# Patient Record
Sex: Male | Born: 1940 | Race: White | Hispanic: No | Marital: Married | Smoking: Never smoker
Health system: Southern US, Community
[De-identification: ages and names within clinical notes are randomized; demographics above are authoritative.]

## PROBLEM LIST (undated history)

## (undated) DIAGNOSIS — K317 Polyp of stomach and duodenum: Secondary | ICD-10-CM

## (undated) DIAGNOSIS — H269 Unspecified cataract: Secondary | ICD-10-CM

## (undated) DIAGNOSIS — M1990 Unspecified osteoarthritis, unspecified site: Secondary | ICD-10-CM

## (undated) DIAGNOSIS — K2270 Barrett's esophagus without dysplasia: Secondary | ICD-10-CM

## (undated) DIAGNOSIS — I10 Essential (primary) hypertension: Secondary | ICD-10-CM

## (undated) DIAGNOSIS — K219 Gastro-esophageal reflux disease without esophagitis: Secondary | ICD-10-CM

## (undated) DIAGNOSIS — Z973 Presence of spectacles and contact lenses: Secondary | ICD-10-CM

## (undated) DIAGNOSIS — K649 Unspecified hemorrhoids: Secondary | ICD-10-CM

## (undated) DIAGNOSIS — T7840XA Allergy, unspecified, initial encounter: Secondary | ICD-10-CM

## (undated) DIAGNOSIS — J189 Pneumonia, unspecified organism: Secondary | ICD-10-CM

## (undated) DIAGNOSIS — E785 Hyperlipidemia, unspecified: Secondary | ICD-10-CM

## (undated) DIAGNOSIS — H409 Unspecified glaucoma: Secondary | ICD-10-CM

## (undated) DIAGNOSIS — R55 Syncope and collapse: Secondary | ICD-10-CM

## (undated) DIAGNOSIS — I4510 Unspecified right bundle-branch block: Secondary | ICD-10-CM

## (undated) HISTORY — DX: Barrett's esophagus without dysplasia: K22.70

## (undated) HISTORY — DX: Unspecified right bundle-branch block: I45.10

## (undated) HISTORY — DX: Unspecified cataract: H26.9

## (undated) HISTORY — DX: Hyperlipidemia, unspecified: E78.5

## (undated) HISTORY — DX: Allergy, unspecified, initial encounter: T78.40XA

## (undated) HISTORY — DX: Gastro-esophageal reflux disease without esophagitis: K21.9

## (undated) HISTORY — DX: Essential (primary) hypertension: I10

## (undated) HISTORY — PX: POLYPECTOMY: SHX149

## (undated) HISTORY — DX: Unspecified osteoarthritis, unspecified site: M19.90

## (undated) HISTORY — PX: COLONOSCOPY: SHX174

## (undated) HISTORY — DX: Unspecified hemorrhoids: K64.9

## (undated) HISTORY — DX: Unspecified glaucoma: H40.9

## (undated) HISTORY — PX: CATARACT EXTRACTION W/ INTRAOCULAR LENS  IMPLANT, BILATERAL: SHX1307

## (undated) HISTORY — PX: WISDOM TOOTH EXTRACTION: SHX21

---
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
## 1991-09-16 HISTORY — PX: CARDIAC CATHETERIZATION: SHX172

## 2011-11-18 DIAGNOSIS — H524 Presbyopia: Secondary | ICD-10-CM | POA: Diagnosis not present

## 2011-11-18 DIAGNOSIS — H4011X0 Primary open-angle glaucoma, stage unspecified: Secondary | ICD-10-CM | POA: Diagnosis not present

## 2011-11-18 DIAGNOSIS — H409 Unspecified glaucoma: Secondary | ICD-10-CM | POA: Diagnosis not present

## 2012-01-20 DIAGNOSIS — E782 Mixed hyperlipidemia: Secondary | ICD-10-CM | POA: Diagnosis not present

## 2012-01-20 DIAGNOSIS — R5381 Other malaise: Secondary | ICD-10-CM | POA: Diagnosis not present

## 2012-01-20 DIAGNOSIS — N429 Disorder of prostate, unspecified: Secondary | ICD-10-CM | POA: Diagnosis not present

## 2012-01-20 DIAGNOSIS — R5383 Other fatigue: Secondary | ICD-10-CM | POA: Diagnosis not present

## 2012-01-27 DIAGNOSIS — M5106 Intervertebral disc disorders with myelopathy, lumbar region: Secondary | ICD-10-CM | POA: Diagnosis not present

## 2012-01-27 DIAGNOSIS — E785 Hyperlipidemia, unspecified: Secondary | ICD-10-CM | POA: Diagnosis not present

## 2012-01-27 DIAGNOSIS — K219 Gastro-esophageal reflux disease without esophagitis: Secondary | ICD-10-CM | POA: Diagnosis not present

## 2012-01-29 DIAGNOSIS — M25559 Pain in unspecified hip: Secondary | ICD-10-CM | POA: Diagnosis not present

## 2012-01-29 DIAGNOSIS — M545 Low back pain, unspecified: Secondary | ICD-10-CM | POA: Diagnosis not present

## 2012-01-29 DIAGNOSIS — M5430 Sciatica, unspecified side: Secondary | ICD-10-CM | POA: Diagnosis not present

## 2012-02-05 DIAGNOSIS — M545 Low back pain, unspecified: Secondary | ICD-10-CM | POA: Diagnosis not present

## 2012-02-05 DIAGNOSIS — M25559 Pain in unspecified hip: Secondary | ICD-10-CM | POA: Diagnosis not present

## 2012-02-05 DIAGNOSIS — M5430 Sciatica, unspecified side: Secondary | ICD-10-CM | POA: Diagnosis not present

## 2012-02-12 DIAGNOSIS — M5430 Sciatica, unspecified side: Secondary | ICD-10-CM | POA: Diagnosis not present

## 2012-02-12 DIAGNOSIS — M545 Low back pain, unspecified: Secondary | ICD-10-CM | POA: Diagnosis not present

## 2012-02-12 DIAGNOSIS — M25559 Pain in unspecified hip: Secondary | ICD-10-CM | POA: Diagnosis not present

## 2012-02-25 DIAGNOSIS — M6530 Trigger finger, unspecified finger: Secondary | ICD-10-CM | POA: Diagnosis not present

## 2012-03-23 DIAGNOSIS — H409 Unspecified glaucoma: Secondary | ICD-10-CM | POA: Diagnosis not present

## 2012-03-23 DIAGNOSIS — H4011X0 Primary open-angle glaucoma, stage unspecified: Secondary | ICD-10-CM | POA: Diagnosis not present

## 2012-05-23 DIAGNOSIS — IMO0002 Reserved for concepts with insufficient information to code with codable children: Secondary | ICD-10-CM | POA: Diagnosis not present

## 2012-06-14 DIAGNOSIS — Z23 Encounter for immunization: Secondary | ICD-10-CM | POA: Diagnosis not present

## 2012-07-29 DIAGNOSIS — E785 Hyperlipidemia, unspecified: Secondary | ICD-10-CM | POA: Diagnosis not present

## 2012-07-29 DIAGNOSIS — K219 Gastro-esophageal reflux disease without esophagitis: Secondary | ICD-10-CM | POA: Diagnosis not present

## 2012-08-05 DIAGNOSIS — L0889 Other specified local infections of the skin and subcutaneous tissue: Secondary | ICD-10-CM | POA: Diagnosis not present

## 2012-08-17 DIAGNOSIS — I739 Peripheral vascular disease, unspecified: Secondary | ICD-10-CM | POA: Diagnosis not present

## 2012-09-02 DIAGNOSIS — L0889 Other specified local infections of the skin and subcutaneous tissue: Secondary | ICD-10-CM | POA: Diagnosis not present

## 2012-09-16 DIAGNOSIS — M79609 Pain in unspecified limb: Secondary | ICD-10-CM | POA: Diagnosis not present

## 2012-09-16 DIAGNOSIS — M7989 Other specified soft tissue disorders: Secondary | ICD-10-CM | POA: Diagnosis not present

## 2012-09-21 DIAGNOSIS — H409 Unspecified glaucoma: Secondary | ICD-10-CM | POA: Diagnosis not present

## 2012-09-21 DIAGNOSIS — H4011X0 Primary open-angle glaucoma, stage unspecified: Secondary | ICD-10-CM | POA: Diagnosis not present

## 2012-09-21 DIAGNOSIS — H524 Presbyopia: Secondary | ICD-10-CM | POA: Diagnosis not present

## 2012-10-07 DIAGNOSIS — L905 Scar conditions and fibrosis of skin: Secondary | ICD-10-CM | POA: Diagnosis not present

## 2012-10-07 DIAGNOSIS — L259 Unspecified contact dermatitis, unspecified cause: Secondary | ICD-10-CM | POA: Diagnosis not present

## 2013-01-26 DIAGNOSIS — K219 Gastro-esophageal reflux disease without esophagitis: Secondary | ICD-10-CM | POA: Diagnosis not present

## 2013-01-26 DIAGNOSIS — E785 Hyperlipidemia, unspecified: Secondary | ICD-10-CM | POA: Diagnosis not present

## 2013-02-01 DIAGNOSIS — R5381 Other malaise: Secondary | ICD-10-CM | POA: Diagnosis not present

## 2013-02-01 DIAGNOSIS — N429 Disorder of prostate, unspecified: Secondary | ICD-10-CM | POA: Diagnosis not present

## 2013-02-01 DIAGNOSIS — E782 Mixed hyperlipidemia: Secondary | ICD-10-CM | POA: Diagnosis not present

## 2013-02-01 DIAGNOSIS — R5383 Other fatigue: Secondary | ICD-10-CM | POA: Diagnosis not present

## 2013-02-01 DIAGNOSIS — I10 Essential (primary) hypertension: Secondary | ICD-10-CM | POA: Diagnosis not present

## 2013-04-04 ENCOUNTER — Non-Acute Institutional Stay: Payer: Medicare Other | Admitting: Internal Medicine

## 2013-04-04 ENCOUNTER — Encounter: Payer: Self-pay | Admitting: Internal Medicine

## 2013-04-04 VITALS — BP 150/78 | HR 76 | Temp 97.4°F | Ht 67.0 in | Wt 176.0 lb

## 2013-04-04 DIAGNOSIS — Z86010 Personal history of colonic polyps: Secondary | ICD-10-CM

## 2013-04-04 DIAGNOSIS — K649 Unspecified hemorrhoids: Secondary | ICD-10-CM | POA: Diagnosis not present

## 2013-04-04 DIAGNOSIS — E785 Hyperlipidemia, unspecified: Secondary | ICD-10-CM | POA: Diagnosis not present

## 2013-04-04 DIAGNOSIS — K219 Gastro-esophageal reflux disease without esophagitis: Secondary | ICD-10-CM | POA: Insufficient documentation

## 2013-04-04 DIAGNOSIS — E782 Mixed hyperlipidemia: Secondary | ICD-10-CM | POA: Insufficient documentation

## 2013-04-04 MED ORDER — HYDROCORTISONE 2.5 % RE CREA: TOPICAL_CREAM | RECTAL | Status: DC

## 2013-04-10 ENCOUNTER — Encounter: Payer: Self-pay | Admitting: Internal Medicine

## 2013-04-10 DIAGNOSIS — Z860100 Personal history of colon polyps, unspecified: Secondary | ICD-10-CM | POA: Insufficient documentation

## 2013-04-10 NOTE — Patient Instructions (Signed)
Continue current medications. 

## 2013-04-10 NOTE — Progress Notes (Signed)
Patient ID: Ian Rowe, male   DOB: 12/20/1940, 72 y.o.   MRN: [id number] MRN: [id number] Name: Ian Rowe  Sex: male Age: 72 y.o. DOB: [birth year]   Facility/Room: Independent at [facility]  Provider: Kimber Relic Emergency Contacts: Extended Emergency Contact Information Primary Emergency Contact: [facility] Address: [address]          [place], [place] [address] [place] Home Phone: [phone] Relation: Spouse  Code Status: Full code  Allergies: Ciprocinonide and Nsaids  Chief Complaint  Patient presents with  . Medical Managment of Chronic Issues    New Patient.  Moved to [facility] July 2014    HPI: Patient is 72 y.o. male who presents 04/04/2013 for a complete review of his medical problems and history. This is our initial visit with this patient who comes to the [facility] medical clinic today. He moved to [facility] about 2 weeks ago.  This is a healthy male who has had very few problems with his health.  He had a heart catheterization in 1993 which was negative. Chest pains at that time were attributed to GERD following the normal heart catheterization.  He has glaucoma. He will be seeing the ophthalmologist, Dr. Charlotte Sanes, at [facility].  He has occasional hemorrhoids  Patient's had a colonoscopy on 2 occasions, 2002 2007. And benign polyp removed on the first procedure and then in 2007 he was told that he had no polyps. He has a negative family history for colon cancer or polyps.  Past Medical History  Diagnosis Date  . GERD (gastroesophageal reflux disease)   . Hyperlipidemia   . Hemorrhoids   . RBBB   . Glaucoma     Past Surgical History  Procedure Laterality Date  . Cardiac catheterization  1993  . Colonoscopy  2002; 2007       Medication List       This list is accurate as of: 04/04/13 11:59 PM.  Always use your most recent med list.               b complex vitamins tablet  Take 1 tablet by mouth daily.      bimatoprost 0.01 % Soln  Commonly known as:  LUMIGAN  1 drop. Use one drop daily in both eyes     folic acid 800 MCG tablet  Commonly known as:  FOLVITE  Take 400 mcg by mouth daily.     GLUCOSAMINE CHONDR 1500 COMPLX PO  Take by mouth. Take one daily     hydrocortisone 2.5 % rectal cream  Commonly known as:  PROCTOZONE-HC  Use rectally 2 times daily as needed to help hemorrhoids     hydrocortisone 2.5 % rectal cream  Commonly known as:  ANUSOL-HC  Place rectally. Use as needed for hemorrhoids     omeprazole 40 MG capsule  Commonly known as:  PRILOSEC  Take 40 mg by mouth. Take one tablet daily for stomach     PSEUDOEPHEDRINE HCL PO  Take by mouth. Take as needed for nasal congestion, cough     simvastatin 40 MG tablet  Commonly known as:  ZOCOR  Take 40 mg by mouth. Take one tablet daily for cholesterol        Meds ordered this encounter  Medications  . hydrocortisone (ANUSOL-HC) 2.5 % rectal cream    Sig: Place rectally. Use as needed for hemorrhoids  . hydrocortisone (PROCTOZONE-HC) 2.5 % rectal cream    Sig: Use rectally 2 times daily as needed to help hemorrhoids  Dispense:  30 g    Refill:  5    Immunization History  Administered Date(s) Administered  . Influenza Whole 06/15/2012  . Pneumococcal Polysaccharide 09/16/2011    History  Substance Use Topics  . Smoking status: Never Smoker   . Smokeless tobacco: Never Used  . Alcohol Use: Yes     Comment: 20  Social History: Marital History: Married in 1964 Housing: Independent living at [facility]. Persons In Home: Spouse, [name] Living Will: Yes. 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 He previously was in [place], [place], although he has worked other places. He considers himself semiretired and will be exploring options for teaching or work in the [place] area. Tobacco Use: None Alcohol: About 20 drinks per  week Caffeine: Drinks some tea Exercises: Regularly Diet: Regular Pets in Home: None     Family History  Problem Relation Age of Onset  . Alzheimer's disease Mother   . Heart disease Father   Father died of a heart attack at age 87 Mother died at age 64 with Alzheimer's 2 siblings: Jeannett Senior, age 74 and Gene, age 40. Both living and well 2 children: Windell Moulding age 47 and Francis Dowse, age 50. Both living and well. There is a family history of heart attacks. In addition to his father, uncles have heart attacks in their 28s and 44s. His paternal aunt had arthritis. His maternal uncle had Parkinson's in his 32s    Review of Systems  DATA OBTAINED: from patient GENERAL: Feels well no fevers, fatigue, appetite changes SKIN: No itching, rash or wounds EYES: No eye pain, redness, discharge EARS: No earache, tinnitus, change in hearing NOSE: No congestion, drainage or bleeding  MOUTH/THROAT: No mouth or tooth pain, No sore throat, No difficulty chewing or swallowing  RESPIRATORY: No cough, wheezing, SOB CARDIAC: No chest pain, palpitations, lower extremity edema  GI: No abdominal pain, No N/V/D or constipation, No heartburn or reflux . History of hiatal hernia. History of. History of hemorrhoids. GU: No dysuria, frequency or urgency, or incontinence  MUSCULOSKELETAL: No unrelieved bone/joint pain. Mild pain in the knees sometimes. NEUROLOGIC: Awake, alert, appropriate to situation, No change in mental status. Moves all four, no focal deficits PSYCHIATRIC: No overt anxiety or sadness. Sleeps well. No behavior issue.  AMBULATION:    Filed Vitals:   04/04/13 1505  BP: 150/78  Pulse: 76  Temp: 97.4 F (36.3 C)    Physical Exam  GENERAL APPEARANCE: Alert, conversant. Energetic. Appropriately groomed. No acute distress.  SKIN: No diaphoresis rash, or wounds HEAD: Normocephalic, atraumatic  EYES: Conjunctiva/lids clear. Pupils round, reactive. EOMs intact. Wears corrective lenses. EARS:  External exam WNL, canals clear. Hearing grossly normal.  NOSE: No deformity or discharge.  MOUTH/THROAT: Lips w/o lesions. Mouth and throat normal. Tongue moist, w/o lesion.  NECK: No thyroid tenderness, enlargement or nodule  RESPIRATORY: Breathing is even, unlabored. Lung sounds are clear   CARDIOVASCULAR: Heart RRR no murmurs, rubs or gallops. No peripheral edema.  ARTERIAL: radial pulse 2+, DP pulse 1+  VENOUS: No varicosities. No venous stasis skin changes  GASTROINTESTINAL: Abdomen is soft, non-tender, not distended w/ normal bowel sounds. No mass, ventral or inguinal hernia. No organomegally RECTAL: Normal sphincter tone. Normal size prostate. No masses. Heme-negative stool. Small mildly irritated hemorrhoids. GENITOURINARY: Bladder non tender, not distended  MUSCULOSKELETAL: No abnormal joints or musculature NEUROLOGIC: Oriented X3. Cranial nerves 2-12 grossly intact. Moves all extremities no tremor. PSYCHIATRIC: Mood and affect appropriate  to situation, no behavioral issues  Patient Active Problem List   Diagnosis Date Noted  . GERD (gastroesophageal reflux disease)   . Hyperlipidemia   . Hemorrhoids     Functional assessment: Independent in all ADL   Assessment and Plan GERD (gastroesophageal reflux disease): Rare symptoms  Hyperlipidemia: Needs lab checked in the future  Hemorrhoids : Mildly irritated today  - Plan: hydrocortisone (PROCTOZONE-HC) 2.5 % rectal cream  Personal history of colonic polyps: He is a candidate for further colonoscopies. Is due about 2017.     GREEN, Lenon Curt, MD

## 2013-05-03 DIAGNOSIS — H409 Unspecified glaucoma: Secondary | ICD-10-CM | POA: Diagnosis not present

## 2013-05-03 DIAGNOSIS — H4011X0 Primary open-angle glaucoma, stage unspecified: Secondary | ICD-10-CM | POA: Diagnosis not present

## 2013-05-03 DIAGNOSIS — H2510 Age-related nuclear cataract, unspecified eye: Secondary | ICD-10-CM | POA: Diagnosis not present

## 2013-06-15 ENCOUNTER — Encounter: Payer: Self-pay | Admitting: Internal Medicine

## 2013-06-15 DIAGNOSIS — Z23 Encounter for immunization: Secondary | ICD-10-CM | POA: Diagnosis not present

## 2013-08-22 DIAGNOSIS — H4011X0 Primary open-angle glaucoma, stage unspecified: Secondary | ICD-10-CM | POA: Diagnosis not present

## 2013-08-22 DIAGNOSIS — H409 Unspecified glaucoma: Secondary | ICD-10-CM | POA: Diagnosis not present

## 2013-09-05 ENCOUNTER — Other Ambulatory Visit: Payer: Self-pay | Admitting: *Deleted

## 2013-09-05 MED ORDER — OMEPRAZOLE 40 MG PO CPDR: DELAYED_RELEASE_CAPSULE | ORAL | Status: DC

## 2013-09-05 MED ORDER — SIMVASTATIN 40 MG PO TABS: ORAL_TABLET | ORAL | Status: DC

## 2013-10-11 DIAGNOSIS — Z79899 Other long term (current) drug therapy: Secondary | ICD-10-CM | POA: Diagnosis not present

## 2013-10-11 DIAGNOSIS — E785 Hyperlipidemia, unspecified: Secondary | ICD-10-CM | POA: Diagnosis not present

## 2013-10-11 LAB — LIPID PANEL
Cholesterol: 181 mg/dL (ref 0–200)
HDL: 49 mg/dL (ref 35–70)
LDL Cholesterol: 100 mg/dL
Triglycerides: 249 mg/dL — AB (ref 40–160)

## 2013-10-11 LAB — HEPATIC FUNCTION PANEL
ALT: 21 U/L (ref 10–40)
AST: 24 U/L (ref 14–40)
Alkaline Phosphatase: 72 U/L (ref 25–125)
Bilirubin, Total: 0.5 mg/dL

## 2013-10-11 LAB — BASIC METABOLIC PANEL
BUN: 15 mg/dL (ref 4–21)
Creatinine: 1.2 mg/dL (ref 0.6–1.3)
Glucose: 97 mg/dL
Potassium: 4.5 mmol/L (ref 3.4–5.3)
Sodium: 141 mmol/L (ref 137–147)

## 2013-10-11 LAB — CBC AND DIFFERENTIAL
HCT: 42 % (ref 41–53)
Hemoglobin: 14.6 g/dL (ref 13.5–17.5)
Platelets: 249 10*3/uL (ref 150–399)
WBC: 6.3 10^3/mL

## 2013-10-17 ENCOUNTER — Encounter: Payer: Self-pay | Admitting: Internal Medicine

## 2013-10-24 ENCOUNTER — Encounter: Payer: Self-pay | Admitting: Internal Medicine

## 2013-10-24 ENCOUNTER — Non-Acute Institutional Stay: Payer: Medicare Other | Admitting: Internal Medicine

## 2013-10-24 VITALS — BP 140/72 | HR 76 | Ht 67.0 in | Wt 182.0 lb

## 2013-10-24 DIAGNOSIS — K219 Gastro-esophageal reflux disease without esophagitis: Secondary | ICD-10-CM | POA: Diagnosis not present

## 2013-10-24 DIAGNOSIS — K649 Unspecified hemorrhoids: Secondary | ICD-10-CM | POA: Diagnosis not present

## 2013-10-24 DIAGNOSIS — E785 Hyperlipidemia, unspecified: Secondary | ICD-10-CM

## 2013-10-24 NOTE — Progress Notes (Signed)
Patient ID: Ian Rowe, male   DOB: [birth year], 73 y.o.   MRN: [id number]    Location:  [facility] (12)    Allergies  Allergen Reactions  . Ciprocinonide [Fluocinolone]     bleeding  . Nsaids     History of bleeding    Chief Complaint  Patient presents with  . Medical Managment of Chronic Issues    GERD, cholesterol    HPI:  Hyperlipidemia: controlled. Mild elevation in trig. Using simvastatin.  GERD (gastroesophageal reflux disease): asymptomatic on omeprazole  Hemorrhoids: continues with intermittent bleeding small quantities. Painless.    Medications: Patient's Medications  New Prescriptions   No medications on file  Previous Medications   B COMPLEX VITAMINS TABLET    Take 1 tablet by mouth daily.   BIMATOPROST (LUMIGAN) 0.01 % SOLN    1 drop. Use one drop daily in both eyes   FOLIC ACID (FOLVITE) 833 MCG TABLET    Take 400 mcg by mouth daily.   GLUCOSAMINE-CHONDROIT-VIT C-MN (GLUCOSAMINE CHONDR 1500 COMPLX PO)    Take by mouth. Take one daily   HYDROCORTISONE (ANUSOL-HC) 2.5 % RECTAL CREAM    Place rectally. Use as needed for hemorrhoids   OMEPRAZOLE (PRILOSEC) 40 MG CAPSULE    Take one tablet daily for stomach   PSEUDOEPHEDRINE HCL PO    Take by mouth. Take as needed for nasal congestion, cough   SIMVASTATIN (ZOCOR) 40 MG TABLET    Take one tablet daily for cholesterol  Modified Medications   No medications on file  Discontinued Medications   HYDROCORTISONE (PROCTOZONE-HC) 2.5 % RECTAL CREAM    Use rectally 2 times daily as needed to help hemorrhoids     Review of Systems  Constitutional: Negative.        Moderately overweight.  HENT: Negative.   Eyes: Positive for visual disturbance (corrective lenses).  Respiratory: Negative.   Cardiovascular: Negative for chest pain, palpitations and leg swelling.  Gastrointestinal: Positive for anal bleeding.  Endocrine: Negative.   Genitourinary: Negative.     Musculoskeletal: Negative.   Skin: Negative.   Allergic/Immunologic: Negative.   Neurological: Negative.   Hematological: Negative.   Psychiatric/Behavioral: Negative.     Filed Vitals:   10/24/13 1332  BP: 140/72  Pulse: 76  Height: 5\' 7"  (1.702 m)  Weight: 182 lb (82.555 kg)   Physical Exam  Constitutional: He is oriented to person, place, and time.  overweight  HENT:  Head: Normocephalic and atraumatic.  Right Ear: External ear normal.  Left Ear: External ear normal.  Eyes: Conjunctivae and EOM are normal. Pupils are equal, round, and reactive to light.  Neck: No JVD present. No tracheal deviation present. No thyromegaly present.  Cardiovascular: Normal rate, regular rhythm, normal heart sounds and intact distal pulses.  Exam reveals no gallop and no friction rub.   No murmur heard. Pulmonary/Chest: No respiratory distress. He has no wheezes. He has no rales. He exhibits no tenderness.  Abdominal: He exhibits no distension and no mass. There is no tenderness.  Musculoskeletal: Normal range of motion. He exhibits no edema and no tenderness.  Lymphadenopathy:    He has no cervical adenopathy.  Neurological: He is alert and oriented to person, place, and time. He has normal reflexes. No cranial nerve deficit. Coordination normal.  Skin: No rash noted. No erythema. No pallor.  Psychiatric: He has a normal mood and affect. His behavior is normal. Judgment and thought content normal.  Labs reviewed: Nursing Home on 10/24/2013  Component Date Value Range Status  . Hemoglobin 10/11/2013 14.6  13.5 - 17.5 g/dL Final  . HCT 10/11/2013 42  41 - 53 % Final  . Platelets 10/11/2013 249  150 - 399 K/L Final  . WBC 10/11/2013 6.3   Final  . Glucose 10/11/2013 97   Final  . BUN 10/11/2013 15  4 - 21 mg/dL Final  . Creatinine 10/11/2013 1.2  0.6 - 1.3 mg/dL Final  . Potassium 10/11/2013 4.5  3.4 - 5.3 mmol/L Final  . Sodium 10/11/2013 141  137 - 147 mmol/L Final  .  Triglycerides 10/11/2013 249* 40 - 160 mg/dL Final  . Cholesterol 10/11/2013 181  0 - 200 mg/dL Final  . HDL 10/11/2013 49  35 - 70 mg/dL Final  . LDL Cholesterol 10/11/2013 100   Final  . Alkaline Phosphatase 10/11/2013 72  25 - 125 U/L Final  . ALT 10/11/2013 21  10 - 40 U/L Final  . AST 10/11/2013 24  14 - 40 U/L Final  . Bilirubin, Total 10/11/2013 0.5   Final      Assessment/Plan  Hyperlipidemia: stable  GERD (gastroesophageal reflux disease): asymptomatic  Hemorrhoids: continues with intermittent painless bleeding

## 2013-10-24 NOTE — Patient Instructions (Signed)
Continue current medications. 

## 2013-11-07 ENCOUNTER — Encounter: Payer: Self-pay | Admitting: Internal Medicine

## 2013-12-07 ENCOUNTER — Non-Acute Institutional Stay: Payer: Medicare Other | Admitting: Geriatric Medicine

## 2013-12-07 ENCOUNTER — Encounter: Payer: Self-pay | Admitting: Geriatric Medicine

## 2013-12-07 VITALS — BP 160/82 | HR 72 | Temp 97.1°F | Wt 179.0 lb

## 2013-12-07 DIAGNOSIS — J019 Acute sinusitis, unspecified: Secondary | ICD-10-CM | POA: Diagnosis not present

## 2013-12-07 MED ORDER — AZITHROMYCIN 250 MG PO TABS: 250.0000 mg | ORAL_TABLET | ORAL | Status: DC

## 2013-12-07 NOTE — Progress Notes (Signed)
Patient ID: Ian Rowe, male   DOB: September 25, 1940, 73 y.o.   MRN: [id number]   [facility] [phone]   Contact Information   Name Relation Home Work [place] Spouse [phone]         Chief Complaint  Patient presents with  . Sinus Problem    started with sore throat on Friday, Saturday drainage yellow & white phlegm drown throat, cough, chills and sweats, no fever.     HPI: This is a 73 y.o. male resident of [facility],  Independent Living  section.  Evaluation is requested today due to upper respiratory syptoms.  This episode strted with sore throat, progressed to nasal drainage, sinus pressure, mild cough, chills. Similar to previous bouts of sinusitis. He has chronic sinus drainage, post nasal drainage treated with antihistamine. Last sinus infection more than 1 year ago, treated with Z-pack.     Allergies  Allergen Reactions  . Ciprocinonide [Fluocinolone]     bleeding  . Nsaids     History of bleeding    MEDICATIONS -     Medication List       This list is accurate as of: 12/07/13 11:59 PM.  Always use your most recent med list.               azithromycin 250 MG tablet  Commonly known as:  ZITHROMAX  Take 1 tablet (250 mg total) by mouth as directed. Take 2 tablets today, then 1 tablet daily for the next 4 days     b complex vitamins tablet  Take 1 tablet by mouth daily.     bimatoprost 0.01 % Soln  Commonly known as:  LUMIGAN  Place 1 drop into both eyes at bedtime.     cetirizine 10 MG tablet  Commonly known as:  ZYRTEC  Take 10 mg by mouth 2 (two) times daily as needed for allergies.     folic acid 175 MCG tablet  Commonly known as:  FOLVITE  Take 800 mcg by mouth daily.     GLUCOSAMINE CHONDR 1500 COMPLX PO  Take 1,500 mg by mouth daily. Take one daily     hydrocortisone 2.5 % rectal cream  Commonly known as:  ANUSOL-HC  Place rectally. Use as needed for hemorrhoids     omeprazole 40 MG capsule  Commonly known as:  PRILOSEC  Take one tablet daily for stomach     PSEUDOEPHEDRINE HCL PO  Take 1 tablet by mouth daily as needed (sinus congestion). Take as needed for nasal congestion, cough     simvastatin 40 MG tablet  Commonly known as:  ZOCOR  Take one tablet daily for cholesterol         DATA REVIEWED  Radiologic Exams:   Laboratory Studies:   REVIEW OF SYSTEMS  DATA OBTAINED: from patient, GENERAL: Does not feel well  Recent chills, mild increase in body temperaure fever, mild fatigue, decrease in appetite. SKIN: No itch, rash or open wounds EYES: No eye pain, dryness or itching  No change in vision EARS: No earache, tinnitus, change in hearing NOSE: Congestion, drainage. Sinus pressure MOUTH/THROAT: No mouth or tooth pain  Sore throat   No difficulty chewing or swallowing RESPIRATORY: Cough, No wheezing, SOB CARDIAC: No chest pain, palpitations  No edema. GI: No abdominal pain  No nausea, vomiting,diarrhea or constipation  No heartburn or reflux  MUSCULOSKELETAL:   No muscle ache, pain, weakness   NEUROLOGIC: No dizziness, fainting, headache, numbness  PSYCHIATRIC: No feelings of anxiety, depression  Sleeps well.     PHYSICAL EXAM Filed Vitals:   12/07/13 1135  BP: 160/82  Pulse: 72  Temp: 97.1 F (36.2 C)  TempSrc: Oral  Weight: 179 lb (81.194 kg)   Body mass index is 28.03 kg/(m^2).  GENERAL APPEARANCE: No acute distress, appropriately groomed, normal body habitus. Alert, pleasant, conversant. SKIN: No diaphoresis, rash, unusual lesions, wounds HEAD: Normocephalic, atraumatic EYES: Conjunctiva/lids clear. Pupils round, reactive. Marland Kitchen  EARS:  Hearing grossly normal. NOSE: No deformity or discharge.  Tender maxillary sinus area MOUTH/THROAT: Lips w/o lesions. Oral mucosa, tongue moist, w/o lesion. Oropharynx w/o redness or lesions.  NECK: Supple, full ROM. No thyroid tenderness, enlargement or nodule LYMPHATICS: No head, neck  or supraclavicular adenopathy RESPIRATORY: Breathing is even, unlabored. Lung sounds are clear and full.  CARDIOVASCULAR: Heart RRR. No murmur or extra heart sounds MUSCULOSKELETAL: Gait is steady NEUROLOGIC: Oriented to time, place, person. Speech clear, no tremor.  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 Treat today's sx with antibiotic, increased antihistamine to BID x 5 days    Family/ staff Communication:      Labs/tests ordered:     Follow up: Return if symptoms worsen or fail to improve, for as scheduled.  Mardene Celeste, NP-C [place] [phone]  12/07/2013

## 2013-12-08 ENCOUNTER — Emergency Department (HOSPITAL_COMMUNITY): Payer: Medicare Other

## 2013-12-08 ENCOUNTER — Encounter (HOSPITAL_COMMUNITY): Payer: Self-pay | Admitting: Emergency Medicine

## 2013-12-08 ENCOUNTER — Emergency Department (HOSPITAL_COMMUNITY)
Admission: EM | Admit: 2013-12-08 | Discharge: 2013-12-08 | Disposition: A | Discharge status: Home / Self Care | Payer: Medicare Other | Attending: Emergency Medicine | Admitting: Emergency Medicine

## 2013-12-08 DIAGNOSIS — Z8669 Personal history of other diseases of the nervous system and sense organs: Secondary | ICD-10-CM | POA: Diagnosis not present

## 2013-12-08 DIAGNOSIS — Z9889 Other specified postprocedural states: Secondary | ICD-10-CM | POA: Diagnosis not present

## 2013-12-08 DIAGNOSIS — S0003XA Contusion of scalp, initial encounter: Secondary | ICD-10-CM | POA: Diagnosis not present

## 2013-12-08 DIAGNOSIS — K219 Gastro-esophageal reflux disease without esophagitis: Secondary | ICD-10-CM | POA: Insufficient documentation

## 2013-12-08 DIAGNOSIS — S0083XA Contusion of other part of head, initial encounter: Secondary | ICD-10-CM | POA: Diagnosis not present

## 2013-12-08 DIAGNOSIS — Z792 Long term (current) use of antibiotics: Secondary | ICD-10-CM | POA: Insufficient documentation

## 2013-12-08 DIAGNOSIS — S0100XA Unspecified open wound of scalp, initial encounter: Secondary | ICD-10-CM | POA: Insufficient documentation

## 2013-12-08 DIAGNOSIS — W1809XA Striking against other object with subsequent fall, initial encounter: Secondary | ICD-10-CM | POA: Insufficient documentation

## 2013-12-08 DIAGNOSIS — Y9210 Unspecified residential institution as the place of occurrence of the external cause: Secondary | ICD-10-CM | POA: Insufficient documentation

## 2013-12-08 DIAGNOSIS — Z79899 Other long term (current) drug therapy: Secondary | ICD-10-CM | POA: Insufficient documentation

## 2013-12-08 DIAGNOSIS — Z8679 Personal history of other diseases of the circulatory system: Secondary | ICD-10-CM | POA: Diagnosis not present

## 2013-12-08 DIAGNOSIS — R404 Transient alteration of awareness: Secondary | ICD-10-CM | POA: Insufficient documentation

## 2013-12-08 DIAGNOSIS — Y939 Activity, unspecified: Secondary | ICD-10-CM | POA: Insufficient documentation

## 2013-12-08 DIAGNOSIS — E785 Hyperlipidemia, unspecified: Secondary | ICD-10-CM | POA: Diagnosis not present

## 2013-12-08 DIAGNOSIS — Z23 Encounter for immunization: Secondary | ICD-10-CM | POA: Insufficient documentation

## 2013-12-08 DIAGNOSIS — S0993XA Unspecified injury of face, initial encounter: Secondary | ICD-10-CM | POA: Diagnosis not present

## 2013-12-08 DIAGNOSIS — R55 Syncope and collapse: Secondary | ICD-10-CM | POA: Diagnosis not present

## 2013-12-08 DIAGNOSIS — S1093XA Contusion of unspecified part of neck, initial encounter: Secondary | ICD-10-CM | POA: Diagnosis not present

## 2013-12-08 DIAGNOSIS — S0101XA Laceration without foreign body of scalp, initial encounter: Secondary | ICD-10-CM

## 2013-12-08 LAB — I-STAT TROPONIN, ED
Troponin i, poc: 0.01 ng/mL (ref 0.00–0.08)
Troponin i, poc: 0 ng/mL (ref 0.00–0.08)

## 2013-12-08 LAB — CBC WITH DIFFERENTIAL/PLATELET
Basophils Absolute: 0.1 10*3/uL (ref 0.0–0.1)
Basophils Relative: 1 % (ref 0–1)
Eosinophils Absolute: 0.2 10*3/uL (ref 0.0–0.7)
Eosinophils Relative: 3 % (ref 0–5)
HCT: 41.9 % (ref 39.0–52.0)
Hemoglobin: 14.8 g/dL (ref 13.0–17.0)
Lymphocytes Relative: 13 % (ref 12–46)
Lymphs Abs: 1.2 10*3/uL (ref 0.7–4.0)
MCH: 31.4 pg (ref 26.0–34.0)
MCHC: 35.3 g/dL (ref 30.0–36.0)
MCV: 88.8 fL (ref 78.0–100.0)
Monocytes Absolute: 0.9 10*3/uL (ref 0.1–1.0)
Monocytes Relative: 10 % (ref 3–12)
Neutro Abs: 6.8 10*3/uL (ref 1.7–7.7)
Neutrophils Relative %: 74 % (ref 43–77)
Platelets: 249 10*3/uL (ref 150–400)
RBC: 4.72 MIL/uL (ref 4.22–5.81)
RDW: 13.7 % (ref 11.5–15.5)
WBC: 9.2 10*3/uL (ref 4.0–10.5)

## 2013-12-08 LAB — BASIC METABOLIC PANEL
BUN: 12 mg/dL (ref 6–23)
CO2: 27 mEq/L (ref 19–32)
Calcium: 9.2 mg/dL (ref 8.4–10.5)
Chloride: 98 mEq/L (ref 96–112)
Creatinine, Ser: 1.14 mg/dL (ref 0.50–1.35)
GFR calc Af Amer: 71 mL/min — ABNORMAL LOW (ref >90)
GFR calc non Af Amer: 61 mL/min — ABNORMAL LOW (ref >90)
Glucose, Bld: 111 mg/dL — ABNORMAL HIGH (ref 70–99)
Potassium: 4.4 mEq/L (ref 3.7–5.3)
Sodium: 139 mEq/L (ref 137–147)

## 2013-12-08 MED ORDER — TETANUS-DIPHTH-ACELL PERTUSSIS 5-2.5-18.5 LF-MCG/0.5 IM SUSP
0.5000 mL | Freq: Once | INTRAMUSCULAR | Status: AC
  Administered 2013-12-08: 0.5 mL via INTRAMUSCULAR
  Filled 2013-12-08: qty 0.5

## 2013-12-08 NOTE — ED Notes (Signed)
Patient transported to CT 

## 2013-12-08 NOTE — ED Provider Notes (Signed)
I saw and evaluated the patient, reviewed the resident's note and I agree with the findings and plan.   EKG Interpretation   Date/Time:  Thursday December 08 2013 09:32:37 EDT Ventricular Rate:  82 PR Interval:  156 QRS Duration: 134 QT Interval:  398 QTC Calculation: 465 R Axis:   49 Text Interpretation:  Sinus rhythm Right bundle branch block No old  tracing to compare Confirmed by WARD,  DO, KRISTEN ([id number]) on 12/08/2013  9:42:03 AM      Pt is a 73 y.o. M with history of hyperlipidemia and right bundle branch block he presents emergency department after a syncopal event today while sitting on the toilet trying to have a bowel movement and coughing. He states that he hit his head on the bathtub and has a large laceration to his upper right forehead. He is not on anticoagulation. There was no seizure-like activity, incontinence or tongue biting. He denies any chest pain, shortness of breath, palpitations or dizziness that led to his syncopal event. He is currently asymptomatic. On exam, patient is medically stable, pleasant and laughing, neurologically intact. Her lung sounds are normal. Abdomen soft and nontender. Equal pulses in all extremities. Syncopal event today likely due to straining for bowel movement and coughing. Suspect vasovagal. We'll obtain cardiac labs, chest x-ray and repair laceration.   Resident repaired patient's laceration, was present for the critical parts of the procedure. His workup has been negative. He's had 2 negative troponins. He is safe to be discharged home. Patient agrees with this plan. Given return precautions.  [place], DO 12/08/13 1635

## 2013-12-08 NOTE — ED Notes (Signed)
Delayed in ECG, Dr. Leonides Schanz and Charge made aware.

## 2013-12-08 NOTE — ED Provider Notes (Signed)
CSN: [id number]     Arrival date & time 12/08/13  0915 History   First MD Initiated Contact with Patient 12/08/13 [phone]     Chief Complaint  Patient presents with  . Head Injury  . Loss of Consciousness     (Consider location/radiation/quality/duration/timing/severity/associated sxs/prior Treatment) Patient is a 73 y.o. male presenting with head injury and syncope. The history is provided by the patient.  Head Injury Location:  Frontal Time since incident:  1 hour Mechanism of injury: fall   Pain details:    Quality:  Dull   Severity:  Mild   Duration:  1 hour   Timing:  Constant   Progression:  Unchanged Chronicity:  New Relieved by:  Nothing Worsened by:  Nothing tried Ineffective treatments:  None tried Associated symptoms: loss of consciousness (prior to injury)   Associated symptoms: no blurred vision, no difficulty breathing, no disorientation, no focal weakness, no nausea, no neck pain, no numbness, no seizures and no vomiting   Risk factors: being elderly   Loss of Consciousness Episode history:  Single Most recent episode:  Today Timing:  Constant Progression:  Resolved Chronicity:  New Context: bowel movement (coughing during bowel movement) and sitting down   Context: not exertion, not inactivity, not sight of blood and not standing up   Witnessed: no   Relieved by: after syncope, quickly woke up. Worsened by:  Nothing tried Ineffective treatments:  None tried Associated symptoms: no chest pain, no difficulty breathing, no fever, no focal weakness, no nausea, no palpitations, no recent fall, no recent surgery, no seizures, no shortness of breath, no vomiting and no weakness   Risk factors: no congenital heart disease and no vascular disease     Past Medical History  Diagnosis Date  . GERD (gastroesophageal reflux disease)   . Hyperlipidemia   . Hemorrhoids   . RBBB   . Glaucoma    Past Surgical History  Procedure Laterality Date  . Cardiac  catheterization  1993  . Colonoscopy  2002; 2007   Family History  Problem Relation Age of Onset  . Alzheimer's disease Mother   . Heart disease Father    History  Substance Use Topics  . Smoking status: Never Smoker   . Smokeless tobacco: Never Used  . Alcohol Use: Yes     Comment: 20    Review of Systems  Constitutional: Negative for fever, activity change and appetite change.  HENT: Negative for congestion and rhinorrhea.   Eyes: Negative for blurred vision, discharge and itching.  Respiratory: Negative for cough, shortness of breath and wheezing.   Cardiovascular: Positive for syncope. Negative for chest pain and palpitations.  Gastrointestinal: Negative for nausea, vomiting, abdominal pain, diarrhea and constipation.  Genitourinary: Negative for hematuria, decreased urine volume and difficulty urinating.  Musculoskeletal: Negative for neck pain.  Skin: Positive for wound. Negative for rash.  Neurological: Positive for loss of consciousness (prior to injury) and syncope. Negative for focal weakness, seizures, weakness and numbness.  All other systems reviewed and are negative.      Allergies  Black pepper; Ciprocinonide; and Nsaids  Home Medications   Current Outpatient Rx  Name  Route  Sig  Dispense  Refill  . Ascorbic Acid (VITAMIN C) 1000 MG tablet   Oral   Take 1,000 mg by mouth daily as needed (onset of cold symptoms).         Marland Kitchen azithromycin (ZITHROMAX) 250 MG tablet   Oral   Take 250-500 mg by mouth See  admin instructions. Take 500 mg on the first day, then 250 mg on days two - five.         . b complex vitamins tablet   Oral   Take 1 tablet by mouth daily.         . bimatoprost (LUMIGAN) 0.01 % SOLN   Both Eyes   Place 1 drop into both eyes at bedtime.          . cetirizine (ZYRTEC) 10 MG tablet   Oral   Take 10 mg by mouth 2 (two) times daily as needed for allergies.          . folic acid (FOLVITE) 941 MCG tablet   Oral   Take 800  mcg by mouth daily.          . Glucosamine-Chondroit-Vit C-Mn (GLUCOSAMINE CHONDR 1500 COMPLX PO)   Oral   Take 1,500 mg by mouth daily. Take one daily         . omeprazole (PRILOSEC) 40 MG capsule   Oral   Take 40 mg by mouth daily.         Marland Kitchen PSEUDOEPHEDRINE HCL PO   Oral   Take 1 tablet by mouth daily as needed (sinus congestion). Take as needed for nasal congestion, cough         . simvastatin (ZOCOR) 40 MG tablet   Oral   Take 40 mg by mouth daily.          BP 160/62  Pulse 85  Temp(Src) 98.3 F (36.8 C) (Oral)  Resp 12  SpO2 95% Physical Exam  Vitals reviewed. Constitutional: He is oriented to person, place, and time. He appears well-developed and well-nourished. No distress.  HENT:  Head: Normocephalic and atraumatic.  Mouth/Throat: Oropharynx is clear and moist. No oropharyngeal exudate.  Eyes: Conjunctivae and EOM are normal. Pupils are equal, round, and reactive to light. Right eye exhibits no discharge. Left eye exhibits no discharge. No scleral icterus.  Neck: Normal range of motion. Neck supple.  Cardiovascular: Normal rate, regular rhythm, normal heart sounds and intact distal pulses.  Exam reveals no gallop and no friction rub.   No murmur heard. Pulmonary/Chest: Effort normal and breath sounds normal. No respiratory distress. He has no wheezes. He has no rales.  Abdominal: Soft. He exhibits no distension and no mass. There is no tenderness.  Musculoskeletal: Normal range of motion.  Neurological: He is alert and oriented to person, place, and time. No cranial nerve deficit. He exhibits normal muscle tone. Coordination normal.  5/5 strength in all exts, normal sensation in all exts, 2+ DTRs in patella and brachioradilias b/l, ambulatory without ataxia  Skin: Skin is warm. No rash noted. He is not diaphoretic.  10 cm semi circular wound, hemostatic, approximating well on frontal aspect of scalp. No obvious foreign body in wound, no FB sensation with  palpation of neighboring tissues    ED Course  LACERATION REPAIR Date/Time: 12/08/2013 1:50 PM Performed by: Sol Passer Authorized by: Nyra Jabs Consent: Verbal consent obtained. Risks and benefits: risks, benefits and alternatives were discussed Consent given by: patient Patient understanding: patient states understanding of the procedure being performed Patient consent: the patient's understanding of the procedure matches consent given Procedure consent: procedure consent matches procedure scheduled Site marked: the operative site was marked Imaging studies: imaging studies available Required items: required blood products, implants, devices, and special equipment available Patient identity confirmed: verbally with patient, hospital-assigned identification number and arm band Time out: Immediately  prior to procedure a "time out" was called to verify the correct patient, procedure, equipment, support staff and site/side marked as required. Body area: head/neck Location details: scalp Laceration length: 12 cm Foreign bodies: no foreign bodies Tendon involvement: none Nerve involvement: none Vascular damage: no Anesthesia: local infiltration Local anesthetic: lidocaine spray Patient sedated: no Preparation: Patient was prepped and draped in the usual sterile fashion. Irrigation solution: saline Irrigation method: jet lavage Amount of cleaning: standard Debridement: none Degree of undermining: none Skin closure: staples Number of sutures: 12 Technique: simple Approximation: close Approximation difficulty: simple Dressing: 4x4 sterile gauze and antibiotic ointment Patient tolerance: Patient tolerated the procedure well with no immediate complications.   (including critical care time) Labs Review Labs Reviewed  BASIC METABOLIC PANEL - Abnormal; Notable for the following:    Glucose, Bld 111 (*)    GFR calc non Af Amer 61 (*)    GFR calc Af Amer 71 (*)    All  other components within normal limits  CBC WITH DIFFERENTIAL  I-STAT TROPOININ, ED  I-STAT TROPOININ, ED   Imaging Review Ct Head Wo Contrast  12/08/2013   CLINICAL DATA:  Loss of consciousness with head trauma  EXAM: CT HEAD WITHOUT CONTRAST  CT CERVICAL SPINE WITHOUT CONTRAST  TECHNIQUE: Multidetector CT imaging of the head and cervical spine was performed following the standard protocol without intravenous contrast. Multiplanar CT image reconstructions of the cervical spine were also generated.  COMPARISON:  None.  FINDINGS: CT HEAD FINDINGS  Skull and Sinuses:Deep right frontal scalp laceration without radiodense foreign body or fracture. There is mild inflammatory mucosal thickening in the nasal cavity and bilateral paranasal sinuses. No hemo sinus.  Orbits: No acute abnormality.  Brain: No evidence of acute abnormality, such as acute infarction, hemorrhage, hydrocephalus, or mass lesion/mass effect. There is patchy bilateral cerebral white matter low density consistent with chronic small vessel ischemia, not unexpected for age. Thin lipomatosis changes within the falx cerebrum.  CT CERVICAL SPINE FINDINGS  Negative for cervical spine fracture. There is mild C7-T1 anterolisthesis which is strictly indeterminate, but could be explained by advanced facet osteoarthritis at this level. No prevertebral edema. No evidence of cervical canal hematoma.  Degenerative disc disease is advanced, with marked disc narrowing at C3-4, C4-5, C5-6, and C6-7. Posterior disc osteophyte complexes at these levels cause central spinal canal stenosis. Uncovertebral spurs at the same levels cause foraminal crowding when combined with facet overgrowth.  Patchy ground-glass density in the upper lungs which is likely from atelectasis.  Patulous upper thoracic esophagus.  IMPRESSION: 1. No evidence of acute intracranial injury or cervical spine fracture. 2. Large right frontal scalp laceration without calvarial fracture. 3. 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   Electronically Signed   By: Jorje Guild M.D.   On: 12/08/2013 10:46   Ct Cervical Spine Wo Contrast  12/08/2013   CLINICAL DATA:  Loss of consciousness with head trauma  EXAM: CT HEAD WITHOUT CONTRAST  CT CERVICAL SPINE WITHOUT CONTRAST  TECHNIQUE: Multidetector CT imaging of the head and cervical spine was performed following the standard protocol without intravenous contrast. Multiplanar CT image reconstructions of the cervical spine were also generated.  COMPARISON:  None.  FINDINGS: CT HEAD FINDINGS  Skull and Sinuses:Deep right frontal scalp laceration without radiodense foreign body or fracture. There is mild inflammatory mucosal thickening in the nasal cavity and bilateral paranasal sinuses. No hemo sinus.  Orbits: No acute abnormality.  Brain: No evidence of acute abnormality,  such as acute infarction, hemorrhage, hydrocephalus, or mass lesion/mass effect. There is patchy bilateral cerebral white matter low density consistent with chronic small vessel ischemia, not unexpected for age. Thin lipomatosis changes within the falx cerebrum.  CT CERVICAL SPINE FINDINGS  Negative for cervical spine fracture. There is mild C7-T1 anterolisthesis which is strictly indeterminate, but could be explained by advanced facet osteoarthritis at this level. No prevertebral edema. No evidence of cervical canal hematoma.  Degenerative disc disease is advanced, with marked disc narrowing at C3-4, C4-5, C5-6, and C6-7. Posterior disc osteophyte complexes at these levels cause central spinal canal stenosis. Uncovertebral spurs at the same levels cause foraminal crowding when combined with facet overgrowth.  Patchy ground-glass density in the upper lungs which is likely from atelectasis.  Patulous upper thoracic esophagus.  IMPRESSION: 1. No evidence of acute intracranial injury or cervical spine fracture. 2. Large right frontal scalp laceration without calvarial  fracture. 3. 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   Electronically Signed   By: Jorje Guild M.D.   On: 12/08/2013 10:46     EKG Interpretation   Date/Time:  Thursday December 08 2013 09:32:37 EDT Ventricular Rate:  82 PR Interval:  156 QRS Duration: 134 QT Interval:  398 QTC Calculation: 465 R Axis:   49 Text Interpretation:  Sinus rhythm Right bundle branch block No old  tracing to compare Confirmed by WARD,  DO, KRISTEN ([id number]) on 12/08/2013  9:42:03 AM      MDM   MDM: 73 y.o. WM w/ syncope. Coughing during BM. No plapitations, chest pain, SOB. Hit head on shower rail and had lac. Feels fine after syncope. No hx of similar. Unsure of tetanus. No hx of heart dz. AFVSS, well appearing, no neuro deficits. Lac approximates well, washed out and stapled as above. Tetanus given. Syncope hx c/w vasovagal. EKG and trop checked, with no change, neg trop. Unlikely cardiogenic as no palpitations, happened during very common vasovagal scenario, normal EKG and trop, no heart dz, no hx of prior. CT head and neck negative. Discharged. Care of case d/w my attending.  Final diagnoses:  Scalp laceration  Vasovagal syncope   Discharged    Sol Passer, MD 12/08/13 1352

## 2013-12-08 NOTE — ED Notes (Addendum)
Pt from independent living at [facility] via [facility] s/p syncopal episode from coughing while on the toilet.  Pt fell forward hitting his head on the bathroom tube immediately bringing him alert.  The [facility] nurse wrapped the pts head and 4 inch laceration to forehead.  Pt is being treated for a sinus infection and chest congestion with a z-pack.  Pt in NAD, A&O.

## 2013-12-08 NOTE — Discharge Instructions (Signed)
Laceration Care, Adult °A laceration is a cut or lesion that goes through all layers of the skin and into the tissue just beneath the skin. °TREATMENT  °Some lacerations may not require closure. Some lacerations may not be able to be closed due to an increased risk of infection. It is important to see your caregiver as soon as possible after an injury to minimize the risk of infection and maximize the opportunity for successful closure. °If closure is appropriate, pain medicines may be given, if needed. The wound will be cleaned to help prevent infection. Your caregiver will use stitches (sutures), staples, wound glue (adhesive), or skin adhesive strips to repair the laceration. These tools bring the skin edges together to allow for faster healing and a better cosmetic outcome. However, all wounds will heal with a scar. Once the wound has healed, scarring can be minimized by covering the wound with sunscreen during the day for 1 full year. °HOME CARE INSTRUCTIONS  °For sutures or staples: °· Keep the wound clean and dry. °· If you were given a bandage (dressing), you should change it at least once a day. Also, change the dressing if it becomes wet or dirty, or as directed by your caregiver. °· Wash the wound with soap and water 2 times a day. Rinse the wound off with water to remove all soap. Pat the wound dry with a clean towel. °· After cleaning, apply a thin layer of the antibiotic ointment as recommended by your caregiver. This will help prevent infection and keep the dressing from sticking. °· You may shower as usual after the first 24 hours. Do not soak the wound in water until the sutures are removed. °· Only take over-the-counter or prescription medicines for pain, discomfort, or fever as directed by your caregiver. °· Get your sutures or staples removed as directed by your caregiver. °For skin adhesive strips: °· Keep the wound clean and dry. °· Do not get the skin adhesive strips wet. You may bathe  carefully, using caution to keep the wound dry. °· If the wound gets wet, pat it dry with a clean towel. °· Skin adhesive strips will fall off on their own. You may trim the strips as the wound heals. Do not remove skin adhesive strips that are still stuck to the wound. They will fall off in time. °For wound adhesive: °· You may briefly wet your wound in the shower or bath. Do not soak or scrub the wound. Do not swim. Avoid periods of heavy perspiration until the skin adhesive has fallen off on its own. After showering or bathing, gently pat the wound dry with a clean towel. °· Do not apply liquid medicine, cream medicine, or ointment medicine to your wound while the skin adhesive is in place. This may loosen the film before your wound is healed. °· If a dressing is placed over the wound, be careful not to apply tape directly over the skin adhesive. This may cause the adhesive to be pulled off before the wound is healed. °· Avoid prolonged exposure to sunlight or tanning lamps while the skin adhesive is in place. Exposure to ultraviolet light in the first year will darken the scar. °· The skin adhesive will usually remain in place for 5 to 10 days, then naturally fall off the skin. Do not pick at the adhesive film. °You may need a tetanus shot if: °· You cannot remember when you had your last tetanus shot. °· You have never had a tetanus   shot. If you get a tetanus shot, your arm may swell, get red, and feel warm to the touch. This is common and not a problem. If you need a tetanus shot and you choose not to have one, there is a rare chance of getting tetanus. Sickness from tetanus can be serious. SEEK MEDICAL CARE IF:   You have redness, swelling, or increasing pain in the wound.  You see a red line that goes away from the wound.  You have yellowish-white fluid (pus) coming from the wound.  You have a fever.  You notice a bad smell coming from the wound or dressing.  Your wound breaks open before or  after sutures have been removed.  You notice something coming out of the wound such as wood or glass.  Your wound is on your hand or foot and you cannot move a finger or toe. SEEK IMMEDIATE MEDICAL CARE IF:   Your pain is not controlled with prescribed medicine.  You have severe swelling around the wound causing pain and numbness or a change in color in your arm, hand, leg, or foot.  Your wound splits open and starts bleeding.  You have worsening numbness, weakness, or loss of function of any joint around or beyond the wound.  You develop painful lumps near the wound or on the skin anywhere on your body. MAKE SURE YOU:   Understand these instructions.  Will watch your condition.  Will get help right away if you are not doing well or get worse. Document Released: 09/01/2005 Document Revised: 11/24/2011 Document Reviewed: 02/25/2011 [facility] Patient Information 2014 [place], [place]. Vasovagal Syncope, Adult Syncope, commonly known as fainting, is a temporary loss of consciousness. It occurs when the blood flow to the brain is reduced. Vasovagal syncope (also called neurocardiogenic syncope) is a fainting spell in which the blood flow to the brain is reduced because of a sudden drop in heart rate and blood pressure. Vasovagal syncope occurs when the brain and the cardiovascular system (blood vessels) do not adequately communicate and respond to each other. This is the most common cause of fainting. It often occurs in response to fear or some other type of emotional or physical stress. The body has a reaction in which the heart starts beating too slowly or the blood vessels expand, reducing blood pressure. This type of fainting spell is generally considered harmless. However, injuries can occur if a person takes a sudden fall during a fainting spell.  CAUSES  Vasovagal syncope occurs when a person's blood pressure and heart rate decrease suddenly, usually in response to a trigger. Many things  and situations can trigger an episode. Some of these include:   Pain.   Fear.   The sight of blood or medical procedures, such as blood being drawn from a vein.   Common activities, such as coughing, swallowing, stretching, or going to the bathroom.   Emotional stress.   Prolonged standing, especially in a warm environment.   Lack of sleep or rest.   Prolonged lack of food.   Prolonged lack of fluids.   Recent illness.  The use of certain drugs that affect blood pressure, such as cocaine, alcohol, marijuana, inhalants, and opiates.  SYMPTOMS  Before the fainting episode, you may:   Feel dizzy or light headed.   Become pale.  Sense that you are going to faint.   Feel like the room is spinning.   Have tunnel vision, only seeing directly in front of you.   Feel sick  to your stomach (nauseous).   See spots or slowly lose vision.   Hear ringing in your ears.   Have a headache.   Feel warm and sweaty.   Feel a sensation of pins and needles. During the fainting spell, you will generally be unconscious for no longer than a couple minutes before waking up and returning to normal. If you get up too quickly before your body can recover, you may faint again. Some twitching or jerky movements may occur during the fainting spell.  DIAGNOSIS  Your caregiver will ask about your symptoms, take a medical history, and perform a physical exam. Various tests may be done to rule out other causes of fainting. These may include blood tests and tests to check the heart, such as electrocardiography, echocardiography, and possibly an electrophysiology study. When other causes have been ruled out, a test may be done to check the body's response to changes in position (tilt table test). TREATMENT  Most cases of vasovagal syncope do not require treatment. Your caregiver may recommend ways to avoid fainting triggers and may provide home strategies for preventing fainting. If  you must be exposed to a possible trigger, you can drink additional fluids to help reduce your chances of having an episode of vasovagal syncope. If you have warning signs of an oncoming episode, you can respond by positioning yourself favorably (lying down). If your fainting spells continue, you may be given medicines to prevent fainting. Some medicines may help make you more resistant to repeated episodes of vasovagal syncope. Special exercises or compression stockings may be recommended. In rare cases, the surgical placement of a pacemaker is considered. HOME CARE INSTRUCTIONS   Learn to identify the warning signs of vasovagal syncope.   Sit or lie down at the first warning sign of a fainting spell. If sitting, put your head down between your legs. If you lie down, swing your legs up in the air to increase blood flow to the brain.   Avoid hot tubs and saunas.  Avoid prolonged standing.  Drink enough fluids to keep your urine clear or pale yellow. Avoid caffeine.  Increase salt in your diet as directed by your caregiver.   If you have to stand for a long time, perform movements such as:   Crossing your legs.   Flexing and stretching your leg muscles.   Squatting.   Moving your legs.   Bending over.   Only take over-the-counter or prescription medicines as directed by your caregiver. Do not suddenly stop any medicines without asking your caregiver first. SEEK MEDICAL CARE IF:   Your fainting spells continue or happen more frequently in spite of treatment.   You lose consciousness for more than a couple minutes.  You have fainting spells during or after exercising or after being startled.   You have new symptoms that occur with the fainting spells, such as:   Shortness of breath.  Chest pain.   Irregular heartbeat.   You have episodes of twitching or jerky movements that last longer than a few seconds.  You have episodes of twitching or jerky movements  without obvious fainting. SEEK IMMEDIATE MEDICAL CARE IF:   You have injuries or bleeding after a fainting spell.   You have episodes of twitching or jerky movements that last longer than 5 minutes.   You have more than one spell of twitching or jerky movements before returning to consciousness after fainting. MAKE SURE YOU:   Understand these instructions.  Will watch your condition.  Will get help right away if you are not doing well or get worse. Document Released: 08/18/2012 Document Reviewed: 08/18/2012 [facility] Patient Information 2014 [place], [place].

## 2013-12-10 DIAGNOSIS — J019 Acute sinusitis, unspecified: Secondary | ICD-10-CM | POA: Insufficient documentation

## 2013-12-10 NOTE — Assessment & Plan Note (Signed)
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 Treat today's sx with antibiotic, increased antihistamine to BID x 5 days

## 2013-12-19 ENCOUNTER — Non-Acute Institutional Stay: Payer: Medicare Other | Admitting: Internal Medicine

## 2013-12-19 ENCOUNTER — Encounter: Payer: Self-pay | Admitting: Internal Medicine

## 2013-12-19 VITALS — BP 144/86 | HR 61 | Temp 98.3°F | Wt 179.6 lb

## 2013-12-19 DIAGNOSIS — S0180XA Unspecified open wound of other part of head, initial encounter: Secondary | ICD-10-CM | POA: Diagnosis not present

## 2013-12-19 DIAGNOSIS — J019 Acute sinusitis, unspecified: Secondary | ICD-10-CM

## 2013-12-19 DIAGNOSIS — I4510 Unspecified right bundle-branch block: Secondary | ICD-10-CM | POA: Diagnosis not present

## 2013-12-19 DIAGNOSIS — Z79899 Other long term (current) drug therapy: Secondary | ICD-10-CM | POA: Diagnosis not present

## 2013-12-19 DIAGNOSIS — E785 Hyperlipidemia, unspecified: Secondary | ICD-10-CM | POA: Diagnosis not present

## 2013-12-19 DIAGNOSIS — R55 Syncope and collapse: Secondary | ICD-10-CM | POA: Insufficient documentation

## 2013-12-19 DIAGNOSIS — T887XXA Unspecified adverse effect of drug or medicament, initial encounter: Secondary | ICD-10-CM | POA: Diagnosis not present

## 2013-12-19 DIAGNOSIS — R030 Elevated blood-pressure reading, without diagnosis of hypertension: Secondary | ICD-10-CM | POA: Diagnosis not present

## 2013-12-19 DIAGNOSIS — S0181XA Laceration without foreign body of other part of head, initial encounter: Secondary | ICD-10-CM

## 2013-12-19 NOTE — Progress Notes (Signed)
Patient ID: Ian Rowe, male   DOB: Mar 07, 1941, 73 y.o.   MRN: [id number]    Location:  [facility]  Place of Service: CLINIC    Allergies  Allergen Reactions  . Black Pepper [Piper] Other (See Comments)    bloating  . Ciprocinonide [Fluocinolone]     bleeding  . Nsaids     History of bleeding    Chief Complaint  Patient presents with  . Medical Managment of Chronic Issues    Remove staples in Forehead. Went to ER had sycope episode while sitting on toilet, hit head on bathtub, 10cm laceration upper right forehead.     HPI:  Laceration of forehead without complication: sustained during vasovagal syncope in BR 12/08/13. Stapled. Denies pain or bleeding. Hoping to get staples out today.  Sinusitis, acute: resolved  Vasovagal syncope: resolved. No further weak episodes. Normal CT brain and chemistries and cardiac enzymes.  RBBB: chronic and found again on EKG 12/08/13    Medications: Patient's Medications  New Prescriptions   No medications on file  Previous Medications   ASCORBIC ACID (VITAMIN C) 1000 MG TABLET    Take 1,000 mg by mouth daily as needed (onset of cold symptoms).   B COMPLEX VITAMINS TABLET    Take 1 tablet by mouth daily.   BIMATOPROST (LUMIGAN) 0.01 % SOLN    Place 1 drop into both eyes at bedtime.    CETIRIZINE (ZYRTEC) 10 MG TABLET    Take 10 mg by mouth 2 (two) times daily as needed for allergies.    FOLIC ACID (FOLVITE) 160 MCG TABLET    Take 800 mcg by mouth daily.    GLUCOSAMINE-CHONDROIT-VIT C-MN (GLUCOSAMINE CHONDR 1500 COMPLX PO)    Take 1,500 mg by mouth daily. Take one daily   OMEPRAZOLE (PRILOSEC) 40 MG CAPSULE    Take 40 mg by mouth daily.   PSEUDOEPHEDRINE HCL PO    Take 1 tablet by mouth daily as needed (sinus congestion). Take as needed for nasal congestion, cough   SIMVASTATIN (ZOCOR) 40 MG TABLET    Take 40 mg by mouth daily.  Modified Medications   No medications on file  Discontinued Medications   AZITHROMYCIN (ZITHROMAX) 250 MG TABLET     Take 250-500 mg by mouth See admin instructions. Take 500 mg on the first day, then 250 mg on days two - five.     Review of Systems  Constitutional: Negative.        Moderately overweight.  HENT: Negative.   Eyes: Positive for visual disturbance (corrective lenses).  Respiratory: Negative.   Cardiovascular: Negative for chest pain, palpitations and leg swelling.  Gastrointestinal: Negative.  Negative for anal bleeding.  Endocrine: Negative.   Genitourinary: Negative.   Musculoskeletal: Negative.   Skin:       10 cm laceration across the forehead. Edges are not perfectly coapted, but the wound has healed.  Allergic/Immunologic: Negative.   Neurological: Negative.   Hematological: Negative.   Psychiatric/Behavioral: Negative.     Filed Vitals:   12/19/13 1533  BP: 144/86  Pulse: 61  Temp: 98.3 F (36.8 C)  TempSrc: Oral  Weight: 179 lb 9.6 oz (81.466 kg)   Physical Exam  Constitutional: He is oriented to person, place, and time.  overweight  HENT:  Head: Normocephalic and atraumatic.  Right Ear: External ear normal.  Left Ear: External ear normal.  Eyes: Conjunctivae and EOM are normal. Pupils are equal, round, and reactive to light.  Neck: No JVD present. No tracheal  deviation present. No thyromegaly present.  Cardiovascular: Normal rate, regular rhythm, normal heart sounds and intact distal pulses.  Exam reveals no gallop and no friction rub.   No murmur heard. Pulmonary/Chest: No respiratory distress. He has no wheezes. He has no rales. He exhibits no tenderness.  Abdominal: He exhibits no distension and no mass. There is no tenderness.  Musculoskeletal: Normal range of motion. He exhibits no edema and no tenderness.  Lymphadenopathy:    He has no cervical adenopathy.  Neurological: He is alert and oriented to person, place, and time. He has normal reflexes. No cranial nerve deficit. Coordination normal.  Skin: No rash noted. No erythema. No pallor.  Healed  laceration across the forehead. Multiple staples were removed.   Psychiatric: He has a normal mood and affect. His behavior is normal. Judgment and thought content normal.     Labs reviewed: Admission on 12/08/2013, Discharged on 12/08/2013  Component Date Value Ref Range Status  . WBC 12/08/2013 9.2  4.0 - 10.5 K/uL Final  . RBC 12/08/2013 4.72  4.22 - 5.81 MIL/uL Final  . Hemoglobin 12/08/2013 14.8  13.0 - 17.0 g/dL Final  . HCT 12/08/2013 41.9  39.0 - 52.0 % Final  . MCV 12/08/2013 88.8  78.0 - 100.0 fL Final  . MCH 12/08/2013 31.4  26.0 - 34.0 pg Final  . MCHC 12/08/2013 35.3  30.0 - 36.0 g/dL Final  . RDW 12/08/2013 13.7  11.5 - 15.5 % Final  . Platelets 12/08/2013 249  150 - 400 K/uL Final  . Neutrophils Relative % 12/08/2013 74  43 - 77 % Final  . Neutro Abs 12/08/2013 6.8  1.7 - 7.7 K/uL Final  . Lymphocytes Relative 12/08/2013 13  12 - 46 % Final  . Lymphs Abs 12/08/2013 1.2  0.7 - 4.0 K/uL Final  . Monocytes Relative 12/08/2013 10  3 - 12 % Final  . Monocytes Absolute 12/08/2013 0.9  0.1 - 1.0 K/uL Final  . Eosinophils Relative 12/08/2013 3  0 - 5 % Final  . Eosinophils Absolute 12/08/2013 0.2  0.0 - 0.7 K/uL Final  . Basophils Relative 12/08/2013 1  0 - 1 % Final  . Basophils Absolute 12/08/2013 0.1  0.0 - 0.1 K/uL Final  . Sodium 12/08/2013 139  137 - 147 mEq/L Final  . Potassium 12/08/2013 4.4  3.7 - 5.3 mEq/L Final  . Chloride 12/08/2013 98  96 - 112 mEq/L Final  . CO2 12/08/2013 27  19 - 32 mEq/L Final  . Glucose, Bld 12/08/2013 111* 70 - 99 mg/dL Final  . BUN 12/08/2013 12  6 - 23 mg/dL Final  . Creatinine, Ser 12/08/2013 1.14  0.50 - 1.35 mg/dL Final  . Calcium 12/08/2013 9.2  8.4 - 10.5 mg/dL Final  . GFR calc non Af Amer 12/08/2013 61* >90 mL/min Final  . GFR calc Af Amer 12/08/2013 71* >90 mL/min Final   Comment: (NOTE)                          The eGFR has been calculated using the CKD EPI equation.                          This calculation has not been  validated in all clinical situations.                          eGFR's persistently <90 mL/min  signify possible Chronic Kidney                          Disease.  . Troponin i, poc 12/08/2013 0.01  0.00 - 0.08 ng/mL Final  . Comment 3 12/08/2013          Final   Comment: Due to the release kinetics of cTnI,                          a negative result within the first hours                          of the onset of symptoms does not rule out                          myocardial infarction with certainty.                          If myocardial infarction is still suspected,                          repeat the test at appropriate intervals.  . Troponin i, poc 12/08/2013 0.00  0.00 - 0.08 ng/mL Final  . Comment 3 12/08/2013          Final   Comment: Due to the release kinetics of cTnI,                          a negative result within the first hours                          of the onset of symptoms does not rule out                          myocardial infarction with certainty.                          If myocardial infarction is still suspected,                          repeat the test at appropriate intervals.  Nursing Home on 10/24/2013  Component Date Value Ref Range Status  . Hemoglobin 10/11/2013 14.6  13.5 - 17.5 g/dL Final  . HCT 10/11/2013 42  41 - 53 % Final  . Platelets 10/11/2013 249  150 - 399 K/L Final  . WBC 10/11/2013 6.3   Final  . Glucose 10/11/2013 97   Final  . BUN 10/11/2013 15  4 - 21 mg/dL Final  . Creatinine 10/11/2013 1.2  0.6 - 1.3 mg/dL Final  . Potassium 10/11/2013 4.5  3.4 - 5.3 mmol/L Final  . Sodium 10/11/2013 141  137 - 147 mmol/L Final  . Triglycerides 10/11/2013 249* 40 - 160 mg/dL Final  . Cholesterol 10/11/2013 181  0 - 200 mg/dL Final  . HDL 10/11/2013 49  35 - 70 mg/dL Final  . LDL Cholesterol 10/11/2013 100   Final  . Alkaline Phosphatase 10/11/2013 72  25 - 125 U/L Final  . ALT 10/11/2013 21  10 - 40 U/L Final  . AST 10/11/2013 24  14 - 40 U/L  Final  . Bilirubin, Total 10/11/2013 0.5   Final      Assessment/Plan  1. Laceration of forehead without complication Removed staples  2. Sinusitis, acute resolved  3. Vasovagal syncope No further episodes  4. RBBB Chronic condition

## 2014-02-27 DIAGNOSIS — H4011X0 Primary open-angle glaucoma, stage unspecified: Secondary | ICD-10-CM | POA: Diagnosis not present

## 2014-04-18 DIAGNOSIS — Z79899 Other long term (current) drug therapy: Secondary | ICD-10-CM | POA: Diagnosis not present

## 2014-04-18 DIAGNOSIS — E785 Hyperlipidemia, unspecified: Secondary | ICD-10-CM | POA: Diagnosis not present

## 2014-04-18 LAB — BASIC METABOLIC PANEL
BUN: 15 mg/dL (ref 4–21)
Creatinine: 1.3 mg/dL (ref 0.6–1.3)
Glucose: 101 mg/dL
Potassium: 4.1 mmol/L (ref 3.4–5.3)
Sodium: 139 mmol/L (ref 137–147)

## 2014-04-18 LAB — LIPID PANEL
Cholesterol: 162 mg/dL (ref 0–200)
HDL: 46 mg/dL (ref 35–70)
LDL Cholesterol: 90 mg/dL
Triglycerides: 134 mg/dL (ref 40–160)

## 2014-04-24 ENCOUNTER — Non-Acute Institutional Stay: Payer: Medicare Other | Admitting: Internal Medicine

## 2014-04-24 ENCOUNTER — Encounter: Payer: Self-pay | Admitting: Internal Medicine

## 2014-04-24 VITALS — BP 146/86 | HR 88 | Wt 181.0 lb

## 2014-04-24 DIAGNOSIS — K219 Gastro-esophageal reflux disease without esophagitis: Secondary | ICD-10-CM | POA: Diagnosis not present

## 2014-04-24 DIAGNOSIS — R55 Syncope and collapse: Secondary | ICD-10-CM | POA: Diagnosis not present

## 2014-04-24 DIAGNOSIS — E785 Hyperlipidemia, unspecified: Secondary | ICD-10-CM

## 2014-04-24 MED ORDER — PRAVASTATIN SODIUM 20 MG PO TABS: ORAL_TABLET | ORAL | Status: DC

## 2014-04-24 NOTE — Progress Notes (Signed)
Patient ID: Ian Rowe, male   DOB: 09-13-1941, 73 y.o.   MRN: [id number]    Location:  [facility] (12)    Allergies  Allergen Reactions  . Black Pepper [Piper] Other (See Comments)    bloating  . Ciprocinonide [Fluocinolone]     bleeding  . Nsaids     History of bleeding    Chief Complaint  Patient presents with  . Medical Management of Chronic Issues    cholesterol, GERD    HPI:  Hyperlipidemia: pharmacogenetic testing suggested a higher than normal potential for myopathy on simvastatin. Current cholesterol values are fine and he has no evidence for myopathy.  Gastroesophageal reflux disease, esophagitis presence not specified: asymptomatic on medication  Vasovagal syncope: no further episode    Medications: Patient's Medications  New Prescriptions   No medications on file  Previous Medications   ASCORBIC ACID (VITAMIN C) 1000 MG TABLET    Take 1,000 mg by mouth daily as needed (onset of cold symptoms).   B COMPLEX VITAMINS TABLET    Take 1 tablet by mouth daily.   BIMATOPROST (LUMIGAN) 0.01 % SOLN    Place 1 drop into both eyes at bedtime.    CETIRIZINE (ZYRTEC) 10 MG TABLET    Take 10 mg by mouth. Take one tablet once or twice daily as needed for allergies   FOLIC ACID (FOLVITE) 295 MCG TABLET    Take 800 mcg by mouth daily.    GLUCOSAMINE-CHONDROIT-VIT C-MN (GLUCOSAMINE CHONDR 1500 COMPLX PO)    Take 1,500 mg by mouth daily. Take one daily   OMEPRAZOLE (PRILOSEC) 40 MG CAPSULE    Take 40 mg by mouth daily.   PSEUDOEPHEDRINE HCL PO    Take 1 tablet by mouth daily as needed (sinus congestion). Take as needed for nasal congestion, cough   SIMVASTATIN (ZOCOR) 40 MG TABLET    Take 40 mg by mouth daily.  Modified Medications   No medications on file  Discontinued Medications   No medications on file     Review of Systems  Constitutional: Negative.        Moderately overweight.  HENT: Negative.   Eyes:  Positive for visual disturbance (corrective lenses).  Respiratory: Negative.   Cardiovascular: Negative for chest pain, palpitations and leg swelling.  Gastrointestinal: Negative.  Negative for anal bleeding.  Endocrine: Negative.   Genitourinary: Negative.   Musculoskeletal: Negative.   Skin:       Previous 10 cm laceration across the forehead has fully healed.  Allergic/Immunologic: Negative.   Neurological: Negative.   Hematological: Negative.   Psychiatric/Behavioral: Negative.     Filed Vitals:   04/24/14 1442  BP: 146/86  Pulse: 88  Weight: 181 lb (82.101 kg)   Body mass index is 28.34 kg/(m^2).  Physical Exam  Constitutional: He is oriented to person, place, and time.  overweight  HENT:  Head: Normocephalic and atraumatic.  Right Ear: External ear normal.  Left Ear: External ear normal.  Eyes: Conjunctivae and EOM are normal. Pupils are equal, round, and reactive to light.  Neck: No JVD present. No tracheal deviation present. No thyromegaly present.  Cardiovascular: Normal rate, regular rhythm, normal heart sounds and intact distal pulses.  Exam reveals no gallop and no friction rub.   No murmur heard. Pulmonary/Chest: No respiratory distress. He has no wheezes. He has no rales. He exhibits no tenderness.  Abdominal: He exhibits no distension and no mass. There is no tenderness.  Musculoskeletal:  Normal range of motion. He exhibits no edema and no tenderness.  Lymphadenopathy:    He has no cervical adenopathy.  Neurological: He is alert and oriented to person, place, and time. He has normal reflexes. No cranial nerve deficit. Coordination normal.  Skin: No rash noted. No erythema. No pallor.  Healed laceration across the forehead.   Psychiatric: He has a normal mood and affect. His behavior is normal. Judgment and thought content normal.     Labs reviewed: Nursing Home on 04/24/2014  Component Date Value Ref Range Status  . Glucose 04/18/2014 101   Final  .  BUN 04/18/2014 15  4 - 21 mg/dL Final  . Creatinine 04/18/2014 1.3  0.6 - 1.3 mg/dL Final  . Potassium 04/18/2014 4.1  3.4 - 5.3 mmol/L Final  . Sodium 04/18/2014 139  137 - 147 mmol/L Final  . Triglycerides 04/18/2014 134  40 - 160 mg/dL Final  . Cholesterol 04/18/2014 162  0 - 200 mg/dL Final  . HDL 04/18/2014 46  35 - 70 mg/dL Final  . LDL Cholesterol 04/18/2014 90   Final      Assessment/Plan  1. Hyperlipidemia - stop simvastatin - pravastatin (PRAVACHOL) 20 MG tablet; One daily to control cholesterol  Dispense: 90 tablet; Refill: 3  2. Gastroesophageal reflux disease, esophagitis presence not specified - continue omeprazole  3. Vasovagal syncope observe

## 2014-06-16 DIAGNOSIS — Z23 Encounter for immunization: Secondary | ICD-10-CM | POA: Diagnosis not present

## 2014-08-28 DIAGNOSIS — H52203 Unspecified astigmatism, bilateral: Secondary | ICD-10-CM | POA: Diagnosis not present

## 2014-08-28 DIAGNOSIS — H4011X2 Primary open-angle glaucoma, moderate stage: Secondary | ICD-10-CM | POA: Diagnosis not present

## 2014-08-28 DIAGNOSIS — H33102 Unspecified retinoschisis, left eye: Secondary | ICD-10-CM | POA: Diagnosis not present

## 2014-08-30 ENCOUNTER — Other Ambulatory Visit: Payer: Self-pay | Admitting: Internal Medicine

## 2014-09-21 DIAGNOSIS — H33312 Horseshoe tear of retina without detachment, left eye: Secondary | ICD-10-CM | POA: Diagnosis not present

## 2014-09-21 DIAGNOSIS — H43813 Vitreous degeneration, bilateral: Secondary | ICD-10-CM | POA: Diagnosis not present

## 2014-10-24 DIAGNOSIS — E785 Hyperlipidemia, unspecified: Secondary | ICD-10-CM | POA: Diagnosis not present

## 2014-10-24 LAB — LIPID PANEL
Cholesterol: 193 mg/dL (ref 0–200)
HDL: 49 mg/dL (ref 35–70)
LDL Cholesterol: 121 mg/dL
LDl/HDL Ratio: 2.5
Triglycerides: 169 mg/dL — AB (ref 40–160)

## 2014-10-30 ENCOUNTER — Encounter: Payer: Self-pay | Admitting: Internal Medicine

## 2014-10-31 ENCOUNTER — Encounter: Payer: Self-pay | Admitting: Internal Medicine

## 2014-10-31 ENCOUNTER — Non-Acute Institutional Stay: Payer: Medicare Other | Admitting: Internal Medicine

## 2014-10-31 VITALS — BP 132/76 | HR 72 | Temp 97.7°F | Wt 176.0 lb

## 2014-10-31 DIAGNOSIS — E559 Vitamin D deficiency, unspecified: Secondary | ICD-10-CM

## 2014-10-31 DIAGNOSIS — E785 Hyperlipidemia, unspecified: Secondary | ICD-10-CM | POA: Diagnosis not present

## 2014-10-31 DIAGNOSIS — K219 Gastro-esophageal reflux disease without esophagitis: Secondary | ICD-10-CM

## 2014-10-31 DIAGNOSIS — K649 Unspecified hemorrhoids: Secondary | ICD-10-CM

## 2014-10-31 DIAGNOSIS — Z23 Encounter for immunization: Secondary | ICD-10-CM

## 2014-10-31 MED ORDER — PRAVASTATIN SODIUM 40 MG PO TABS: ORAL_TABLET | ORAL | Status: DC

## 2014-10-31 NOTE — Progress Notes (Signed)
Patient ID: Ian Rowe, male   DOB: 1941/04/21, 74 y.o.   MRN: [id number]   Location:  [facility]  Allergies  Allergen Reactions  . Black Pepper [Piper] Other (See Comments)    bloating  . Ciprocinonide [Fluocinolone]     bleeding  . Nsaids     History of bleeding    Chief Complaint  Patient presents with  . Medical Management of Chronic Issues    cholesterol, GERD    HPI: Patient is a 74 y.o. white male seen in the office today for med mgt of chronic diseases.    Has been working to get down to 165 lbs.  He had gained weight due to Murray in the kitchen.  Eating smaller portions.  Feels a lot better.  Dr. Nyoka Cowden just changed him from simvastatin to pravastatin based on dna swab.    Omeprazole--has been taking it for a long time for GERD. New program indicated possible dementia risk with that.  He also thinks it talked about the bone density.   Took donatol many years ago.  Takes each morning.  Works [employer], manufacturing.  Can take a nap after a meal, and used to not be able to.  Now uses no more than 1 rolaid per week.    Has been told in the past that his bone density was low.8-10 years ago.  Drinks skim milk, eats cheese, takes mvi with some ca.  Takes extra folic acid.  Had a polyp on his first cscope, but second was normal. Discussed vitamin D 2000 units.    Vision has been stable.  Did have retina damage repaired with a laser.  Dr. Oval Linsey at [facility] ophtho about 2 mos ago.   No hemorrhoidal flares.  Review of Systems:  Review of Systems  Constitutional: Positive for weight loss. Negative for fever, chills and malaise/fatigue.  HENT: Negative for hearing loss.   Eyes: Negative for blurred vision.       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   Cardiovascular: Negative for chest pain, palpitations and leg swelling.  Gastrointestinal: Positive for heartburn. Negative for abdominal pain.       Hemorrhoids    Genitourinary: Negative for dysuria.  Musculoskeletal: Negative for myalgias and falls.  Neurological: Negative for dizziness and weakness.  Psychiatric/Behavioral: Negative for depression and memory loss.     Past Medical History  Diagnosis Date  . GERD (gastroesophageal reflux disease)   . Hyperlipidemia   . Hemorrhoids   . RBBB   . Glaucoma     Past Surgical History  Procedure Laterality Date  . Cardiac catheterization  1993  . Colonoscopy  2002; 2007    Social History:   reports that he has never smoked. He has never used smokeless tobacco. He reports that he drinks alcohol. He reports that he does not use illicit drugs.  Family History  Problem Relation Age of Onset  . Alzheimer's disease Mother   . Heart disease Father     Medications: Patient's Medications  New Prescriptions   No medications on file  Previous Medications   ASCORBIC ACID (VITAMIN C) 1000 MG TABLET    Take 1,000 mg by mouth daily as needed (onset of cold symptoms).   B COMPLEX VITAMINS TABLET    Take 1 tablet by mouth daily.   BIMATOPROST (LUMIGAN) 0.01 % SOLN    Place 1 drop into both eyes at bedtime.    CETIRIZINE (ZYRTEC) 10  MG TABLET    Take 10 mg by mouth. Take one tablet once or twice daily as needed for allergies   FOLIC ACID (FOLVITE) 992 MCG TABLET    Take 800 mcg by mouth daily.    GLUCOSAMINE-CHONDROIT-VIT C-MN (GLUCOSAMINE CHONDR 1500 COMPLX PO)    Take 1,500 mg by mouth daily. Take one daily   OMEPRAZOLE (PRILOSEC) 40 MG CAPSULE    TAKE ONE TABLET DAILY FOR STOMACH   PRAVASTATIN (PRAVACHOL) 20 MG TABLET    One daily to control cholesterol   PSEUDOEPHEDRINE HCL PO    Take 1 tablet by mouth daily as needed (sinus congestion). Take as needed for nasal congestion, cough  Modified Medications   No medications on file  Discontinued Medications   No medications on file     Physical Exam: Filed Vitals:   10/31/14 1436  BP: 132/76  Pulse: 72  Temp: 97.7 F (36.5 C)  TempSrc: Oral   Weight: 176 lb (79.833 kg)  Physical Exam  Constitutional: He is oriented to person, place, and time. He appears well-developed and well-nourished. No distress.  Eyes:  glasses  Cardiovascular: Normal rate, regular rhythm, normal heart sounds and intact distal pulses.   Pulmonary/Chest: Effort normal and breath sounds normal. No respiratory distress.  Abdominal: Soft. Bowel sounds are normal.  Musculoskeletal: Normal range of motion.  Ambulatory w/o assistive device  Neurological: He is alert and oriented to person, place, and time.  Skin: Skin is warm and dry.  Psychiatric: He has a normal mood and affect.    Labs reviewed: Basic Metabolic Panel:  Recent Labs  12/08/13 1031 04/18/14  NA 139 139  K 4.4 4.1  CL 98  --   CO2 27  --   GLUCOSE 111*  --   BUN 12 15  CREATININE 1.14 1.3  CALCIUM 9.2  --    Liver Function Tests: No results for input(s): AST, ALT, ALKPHOS, BILITOT, PROT, ALBUMIN in the last 8760 hours. No results for input(s): LIPASE, AMYLASE in the last 8760 hours. No results for input(s): AMMONIA in the last 8760 hours. CBC:  Recent Labs  12/08/13 1031  WBC 9.2  NEUTROABS 6.8  HGB 14.8  HCT 41.9  MCV 88.8  PLT 249   Lipid Panel:  Recent Labs  04/18/14 10/24/14  CHOL 162 193  HDL 46 49  LDLCALC 90 121  TRIG 134 169*    Assessment/Plan 1. Hyperlipidemia -cont decreased portion sizes, walking for exercise -increase pravachol dose to get ldl to goal, recheck 3 mos - pravastatin (PRAVACHOL) 40 MG tablet; One daily to control cholesterol  Dispense: 90 tablet; Refill: 3  2. Gastroesophageal reflux disease, esophagitis presence not specified -cont omeprazole for now--will look into article about correlation with dementia  3. Hemorrhoids, unspecified hemorrhoid type -no difficulty lately  4. Vitamin D deficiency -will check level before next visit and he will consider taking supplement if needed -does not want bone density study done though he  was told in the past that his density was low (before he moved here)  5. Need for vaccination with 13-polyvalent pneumococcal conjugate vaccine - Pneumococcal conjugate vaccine 13-valent given today  Labs/tests ordered:  Flp, vitamin D before Next appt:  3 mos labs before  Everard Interrante L. Ranbir Chew, D.O. [facility] [address], [place] [address] Cell Phone (Mon-Fri 8am-5pm):  [phone] On Call:  [phone] & follow prompts after 5pm & weekends Office Phone:  [phone] Office Fax:  [phone]

## 2015-01-23 DIAGNOSIS — E785 Hyperlipidemia, unspecified: Secondary | ICD-10-CM | POA: Diagnosis not present

## 2015-01-23 DIAGNOSIS — E559 Vitamin D deficiency, unspecified: Secondary | ICD-10-CM | POA: Diagnosis not present

## 2015-01-23 LAB — LIPID PANEL
Cholesterol: 185 mg/dL (ref 0–200)
HDL: 47 mg/dL (ref 35–70)
LDL Cholesterol: 102 mg/dL
LDl/HDL Ratio: 2.2
Triglycerides: 150 mg/dL (ref 40–160)

## 2015-01-30 ENCOUNTER — Encounter: Payer: Self-pay | Admitting: Internal Medicine

## 2015-01-30 ENCOUNTER — Non-Acute Institutional Stay: Payer: Medicare Other | Admitting: Internal Medicine

## 2015-01-30 VITALS — BP 142/82 | HR 62 | Temp 97.8°F | Wt 176.0 lb

## 2015-01-30 DIAGNOSIS — E559 Vitamin D deficiency, unspecified: Secondary | ICD-10-CM

## 2015-01-30 DIAGNOSIS — M7661 Achilles tendinitis, right leg: Secondary | ICD-10-CM

## 2015-01-30 DIAGNOSIS — M7662 Achilles tendinitis, left leg: Secondary | ICD-10-CM

## 2015-01-30 DIAGNOSIS — K219 Gastro-esophageal reflux disease without esophagitis: Secondary | ICD-10-CM

## 2015-01-30 DIAGNOSIS — R0982 Postnasal drip: Secondary | ICD-10-CM

## 2015-01-30 DIAGNOSIS — E785 Hyperlipidemia, unspecified: Secondary | ICD-10-CM | POA: Diagnosis not present

## 2015-01-30 MED ORDER — VITAMIN D 50 MCG (2000 UT) PO CAPS: 1.0000 | ORAL_CAPSULE | Freq: Every day | ORAL | Status: AC

## 2015-01-30 NOTE — Progress Notes (Signed)
Patient ID: Ian Rowe, male   DOB: [birth year], 74 y.o.   MRN: [id number]   Location:  [facility]  Code Status: DNR  Goals of Care:Advanced Directive information Does patient have an advance directive?: Yes, Type of Advance Directive: Living will;Healthcare Power of [place];Out of facility DNR (pink MOST or yellow form), Pre-existing out of facility DNR order (yellow form or pink MOST form): Yellow form placed in chart (order not valid for inpatient use), Does patient want to make changes to advanced directive?: No - Patient declined   Chief Complaint  Patient presents with  . Medical Management of Chronic Issues    cholesterol, GERD  . Ankle Pain    for 2 weeks, no injury, doesn't know if he should or should not walk    HPI: Patient is a 74 y.o.  seen in the [facility] today for med mgt of chronic disease.    About two weeks ago, started with pain in both ankles at achilles tendon.  Dr. Laverta Baltimore, a retired orthopedic surgeon said it might be partially torn on the right.  Says he wore the same shoes for 10 years and it rubbed there.  Gave away those shoes and using tennis shoes more.    Reviewed improved lipids with pravachol.  Will cut back some on cheese and increase low fat yogurt and take D3.    Father's side did have cardiac problems, but he seems to have gotten his mother's genes.  He is worried about his wife who has worsening tremor and her spirits are not good.    Review of Systems:  ROS  Past Medical History  Diagnosis Date  . GERD (gastroesophageal reflux disease)   . Hyperlipidemia   . Hemorrhoids   . RBBB   . Glaucoma     Past Surgical History  Procedure Laterality Date  . Cardiac catheterization  1993  . Colonoscopy  2002; 2007    Social History:   reports that he has never smoked. He has never used smokeless tobacco. He reports that he drinks about 0.6 oz of alcohol per week. He reports that he does not use illicit drugs.  Allergies    Allergen Reactions  . Black Pepper [Piper] Other (See Comments)    bloating  . Ciprocinonide [Fluocinolone]     bleeding  . Nsaids     History of bleeding    Medications: Patient's Medications  New Prescriptions   No medications on file  Previous Medications   ASCORBIC ACID (VITAMIN C) 1000 MG TABLET    Take 1,000 mg by mouth daily as needed (onset of cold symptoms).   B COMPLEX VITAMINS TABLET    Take 1 tablet by mouth daily.   BIMATOPROST (LUMIGAN) 0.01 % SOLN    Place 1 drop into both eyes at bedtime.    CETIRIZINE (ZYRTEC) 10 MG TABLET    Take 10 mg by mouth. Take one tablet once or twice daily as needed for allergies   FOLIC ACID (FOLVITE) 220 MCG TABLET    Take 800 mcg by mouth daily.    GLUCOSAMINE-CHONDROIT-VIT C-MN (GLUCOSAMINE CHONDR 1500 COMPLX PO)    Take 1,500 mg by mouth daily. Take one daily   OMEPRAZOLE (PRILOSEC) 40 MG CAPSULE    TAKE ONE TABLET DAILY FOR STOMACH   PRAVASTATIN (PRAVACHOL) 40 MG TABLET    One daily to control cholesterol   PSEUDOEPHEDRINE HCL PO    Take 1 tablet by mouth daily as needed (sinus congestion). Take as  needed for nasal congestion, cough  Modified Medications   No medications on file  Discontinued Medications   No medications on file     Physical Exam: Filed Vitals:   01/30/15 1625  BP: 142/82  Pulse: 62  Temp: 97.8 F (36.6 C)  Weight: 176 lb (79.833 kg)   Body mass index is 27.56 kg/(m^2). Physical Exam  Labs reviewed: Basic Metabolic Panel:  Recent Labs  04/18/14  NA 139  K 4.1  BUN 15  CREATININE 1.3  Lipid Panel:  Recent Labs  04/18/14 10/24/14 01/23/15  CHOL 162 193 185  HDL 46 49 47  LDLCALC 90 121 102  TRIG 134 169* 150   Patient Care Team: Gayland Curry, DO as PCP - General (Geriatric Medicine) [facility]  Assessment/Plan 1. Achilles tendinitis of both lower extremities -advised to take it easy in the next couple of weeks -has already gotten through the most acute phase and  used ice -advised now to apply heat when having discomfort -has already switched shoewear -avoid quinolones in him as he may have had a partial tear of the one ligament  2. Gastroesophageal reflux disease, esophagitis presence not specified -cont omeprazole--he has significant symptoms without it  3. Hyperlipidemia -cont pravachol 40mg  daily and decrease cheese intake  4. Vitamin D deficiency -advised to take vitamin D 2000 units daily due to deficiency  5. Postnasal drip -cont rare use of sudafed maybe twice a month at the most for severe symptoms  Labs/tests ordered:  Cbc, cmp, flp before Next appt:  6 mos for annual exam  Ellaree Gear L. Sahra Converse, D.O. [facility] [address], [place] [address] Cell Phone (Mon-Fri 8am-5pm):  [phone] On Call:  [phone] & follow prompts after 5pm & weekends Office Phone:  [phone] Office Fax:  [phone]

## 2015-02-19 ENCOUNTER — Encounter: Payer: Self-pay | Admitting: Internal Medicine

## 2015-02-25 ENCOUNTER — Other Ambulatory Visit: Payer: Self-pay | Admitting: Internal Medicine

## 2015-02-26 DIAGNOSIS — H4011X2 Primary open-angle glaucoma, moderate stage: Secondary | ICD-10-CM | POA: Diagnosis not present

## 2015-03-02 ENCOUNTER — Telehealth: Payer: Self-pay | Admitting: *Deleted

## 2015-03-02 NOTE — Telephone Encounter (Signed)
Ian Rowe called regarding his achilles tendon,he stated that it was still bothering him a little and he wondered if he should go see a podiatrist or a orthopedic doctor about this issue. We discussed all of the options that Dr. Mariea Clonts had suggested, and he stated that he would continue those for a while longer since it had not been 6-8 weeks as he was told. He stated that he would see how he felt over the weekend, if not better he would talk with Dr. Mariea Clonts on Tuesday.

## 2015-03-27 DIAGNOSIS — H4011X2 Primary open-angle glaucoma, moderate stage: Secondary | ICD-10-CM | POA: Diagnosis not present

## 2015-04-06 ENCOUNTER — Telehealth: Payer: Self-pay

## 2015-04-06 NOTE — Telephone Encounter (Signed)
I recommend he see orthopedics.  Has he seen one in the past that he'd like to see again?

## 2015-04-06 NOTE — Telephone Encounter (Signed)
Spoke with patient, he will talk with Dr. Laverta Baltimore who to go to. If he has any trouble he'll call back

## 2015-04-06 NOTE — Telephone Encounter (Signed)
It has been 12 weeks, still having pain (R) achilles, when he walks a lot, some swelling. Been using ice/heat and staying off of it as much as he can. What should he do now see Dr. Mariea Clonts or go to orthopedic? Rout message to Dr. Mariea Clonts, will call him back. This was fine with the patient

## 2015-04-10 DIAGNOSIS — M7661 Achilles tendinitis, right leg: Secondary | ICD-10-CM | POA: Diagnosis not present

## 2015-04-18 DIAGNOSIS — S86091S Other specified injury of right Achilles tendon, sequela: Secondary | ICD-10-CM | POA: Diagnosis not present

## 2015-04-25 DIAGNOSIS — S86091S Other specified injury of right Achilles tendon, sequela: Secondary | ICD-10-CM | POA: Diagnosis not present

## 2015-05-14 DIAGNOSIS — S86091S Other specified injury of right Achilles tendon, sequela: Secondary | ICD-10-CM | POA: Diagnosis not present

## 2015-06-05 DIAGNOSIS — H43813 Vitreous degeneration, bilateral: Secondary | ICD-10-CM | POA: Diagnosis not present

## 2015-06-05 DIAGNOSIS — H33312 Horseshoe tear of retina without detachment, left eye: Secondary | ICD-10-CM | POA: Diagnosis not present

## 2015-06-19 DIAGNOSIS — E559 Vitamin D deficiency, unspecified: Secondary | ICD-10-CM | POA: Diagnosis not present

## 2015-06-19 DIAGNOSIS — Z79899 Other long term (current) drug therapy: Secondary | ICD-10-CM | POA: Diagnosis not present

## 2015-06-19 DIAGNOSIS — E785 Hyperlipidemia, unspecified: Secondary | ICD-10-CM | POA: Diagnosis not present

## 2015-06-19 LAB — BASIC METABOLIC PANEL
BUN: 13 mg/dL (ref 4–21)
Creatinine: 1.2 mg/dL (ref 0.6–1.3)
Glucose: 99 mg/dL
Potassium: 4 mmol/L (ref 3.4–5.3)
Sodium: 140 mmol/L (ref 137–147)

## 2015-06-19 LAB — HEPATIC FUNCTION PANEL
ALT: 19 U/L (ref 10–40)
AST: 21 U/L (ref 14–40)
Alkaline Phosphatase: 67 U/L (ref 25–125)
Bilirubin, Total: 0.6 mg/dL

## 2015-06-19 LAB — CBC AND DIFFERENTIAL
HCT: 40 % — AB (ref 41–53)
Hemoglobin: 14 g/dL (ref 13.5–17.5)
Platelets: 239 10*3/uL (ref 150–399)
WBC: 7.1 10^3/mL

## 2015-06-19 LAB — LIPID PANEL
Cholesterol: 168 mg/dL (ref 0–200)
HDL: 41 mg/dL (ref 35–70)
LDL Cholesterol: 104 mg/dL
LDl/HDL Ratio: 2.5
Triglycerides: 182 mg/dL — AB (ref 40–160)

## 2015-06-21 ENCOUNTER — Other Ambulatory Visit: Payer: Self-pay

## 2015-06-21 DIAGNOSIS — Z23 Encounter for immunization: Secondary | ICD-10-CM | POA: Diagnosis not present

## 2015-06-27 ENCOUNTER — Non-Acute Institutional Stay: Payer: Medicare Other | Admitting: Internal Medicine

## 2015-06-27 ENCOUNTER — Encounter: Payer: Self-pay | Admitting: Internal Medicine

## 2015-06-27 VITALS — BP 130/62 | HR 72 | Temp 97.5°F | Ht 68.0 in | Wt 175.0 lb

## 2015-06-27 DIAGNOSIS — R0982 Postnasal drip: Secondary | ICD-10-CM | POA: Diagnosis not present

## 2015-06-27 DIAGNOSIS — S86001S Unspecified injury of right Achilles tendon, sequela: Secondary | ICD-10-CM | POA: Diagnosis not present

## 2015-06-27 DIAGNOSIS — I4510 Unspecified right bundle-branch block: Secondary | ICD-10-CM | POA: Diagnosis not present

## 2015-06-27 DIAGNOSIS — E559 Vitamin D deficiency, unspecified: Secondary | ICD-10-CM

## 2015-06-27 DIAGNOSIS — E781 Pure hyperglyceridemia: Secondary | ICD-10-CM

## 2015-06-27 DIAGNOSIS — K219 Gastro-esophageal reflux disease without esophagitis: Secondary | ICD-10-CM

## 2015-06-27 DIAGNOSIS — Z0000 Encounter for general adult medical examination without abnormal findings: Secondary | ICD-10-CM

## 2015-06-27 NOTE — Progress Notes (Signed)
Patient ID: Ian Rowe, male   DOB: 07-14-1941, 74 y.o.   MRN: [id number]   Location: [facility] Provider: Ellieanna Funderburg L. Mariea Clonts, D.O., C.M.D.  Code Status: DNR Goals of Care: Advanced Directive information Does patient have an advance directive?: Yes, Type of Advance Directive: [place], Does patient want to make changes to advanced directive?: No - Patient declined  Chief Complaint  Patient presents with  . Annual Exam    Comprehensive exam: cholesterol, Vitamin D deficiency, GERD    HPI: Patient is a 74 y.o. male seen in the office today for an annual wellness exam and medical mgt of his chronic diseases.    Right ankle:  Early May, suffered injury.  Dr. Laverta Baltimore (orthopedist retired) examined it--achilles was partially torn.  After 10 wks, I saw him and sent him to ortho, Dr. Erlinda Hong.  Examined and was referred for PT.  Felt to be scar tissue causing pain.  Had been using voltaren gel 2x-4x.  After a month using it, had stomach pain--has h/o of stomach bleeding.  Stopped taking it and finally in past few days had resolution of lower abdominal pain (left lower quadrant).  Has not seen blood in his stool.  Has been generally more "gassy".  Says pressure was in LLQ not stomach--was an irritation, not true pain.  Ankle is still somewhat swollen and tender when he presses on it.  Still doing exercises.   Depression screen [facility] 2/9 06/27/2015 01/30/2015  Decreased Interest 0 0  Down, Depressed, Hopeless 0 0  PHQ - 2 Score 0 0    Fall Risk  01/30/2015  Falls in the past year? No   MMSE - Mini Mental State Exam 06/27/2015  Orientation to time 5  Orientation to Place 5  Registration 3  Attention/ Calculation 5  Recall 3  Language- name 2 objects 2  Language- repeat 1  Language- follow 3 step command 3  Language- read & follow direction 1  Write a sentence 1  Copy design 1  Total score 30  Passed clock.  Health Maintenance  Topic Date Due  . ZOSTAVAX  02/07/1999    . INFLUENZA VACCINE  04/15/2016  . TETANUS/TDAP  12/09/2023  . PNA vac Low Risk Adult  Completed   Urinary incontinence:  No difficulty with this.  Gets up 1x at night with full bladder after 1-2 drinks at hs.    Functional status:  Still independent in functioning.    Exercise:  Is back to walking a minimum of 150 mins per day.    Diet:  Eats a balanced diet--weight stable.  Does not eat everything--eats 1/2 for supper and 1/2 the next day for lunch.  On 80/20 plan.     Visual Acuity Screening   Right eye Left eye Both eyes  Without correction: 20/50 20/200 20/40  With correction: 20/30 20/25 20/25   Eyes stable with his glaucoma.  Sees Dr. Mikey Kirschner his drops religiously.   Hearing: No complaints. Notes he has a great sense of smell. Dentition:  Has good teeth.  Just had his checkup here.  Has had a few cavities and one cap.  Review of Systems:  Review of Systems  Constitutional: Negative for fever, chills and malaise/fatigue.  HENT: Negative for congestion and hearing loss.        Some postnasal drip worse around this time of year (clearing up now vs. 3 wks ago)--better after the first hour of the day also  Eyes: Negative for blurred vision.  Glasses, glaucoma  Respiratory: Negative for cough and shortness of breath.   Cardiovascular: Negative for chest pain and leg swelling.  Gastrointestinal: Positive for heartburn and abdominal pain. Negative for diarrhea, constipation, blood in stool and melena.  Genitourinary: Negative for dysuria, urgency and frequency.  Musculoskeletal: Negative for falls.  Skin: Negative for rash.  Neurological: Negative for dizziness, loss of consciousness, weakness and headaches.  Endo/Heme/Allergies: Positive for environmental allergies. Does not bruise/bleed easily.  Psychiatric/Behavioral: Negative for depression and memory loss.    Past Medical History  Diagnosis Date  . GERD (gastroesophageal reflux disease)   . Hyperlipidemia   .  Hemorrhoids   . RBBB   . Glaucoma     Past Surgical History  Procedure Laterality Date  . Cardiac catheterization  1993  . Colonoscopy  2002; 2007    Allergies  Allergen Reactions  . Black Pepper [[place]] Other (See Comments)    bloating  . Ciprocinonide [Fluocinolone]     bleeding  . Nsaids     History of bleeding      Medication List       This list is accurate as of: 06/27/15 10:49 AM.  Always use your most recent med list.               b complex vitamins tablet  Take 1 tablet by mouth daily.     bimatoprost 0.01 % Soln  Commonly known as:  LUMIGAN  Place 1 drop into both eyes at bedtime.     cetirizine 10 MG tablet  Commonly known as:  ZYRTEC  Take 10 mg by mouth. Take one tablet once or twice daily as needed for allergies     folic acid 607 MCG tablet  Commonly known as:  FOLVITE  Take 800 mcg by mouth daily.     GLUCOSAMINE CHONDR 1500 COMPLX PO  Take 1,500 mg by mouth daily. Take one daily     omeprazole 40 MG capsule  Commonly known as:  PRILOSEC  TAKE ONE TABLET DAILY FOR STOMACH     pravastatin 40 MG tablet  Commonly known as:  PRAVACHOL  One daily to control cholesterol     PSEUDOEPHEDRINE HCL PO  Take 1 tablet by mouth daily as needed (sinus congestion). Take as needed for nasal congestion, cough     timolol 0.5 % ophthalmic solution  Commonly known as:  TIMOPTIC  One drop in both eyes once a day in morning for glaucoma     vitamin C 1000 MG tablet  Take 1,000 mg by mouth daily as needed (onset of cold symptoms).     Vitamin D 2000 UNITS Caps  Take 1 capsule (2,000 Units total) by mouth daily.        Physical Exam: Filed Vitals:   06/27/15 1015  BP: 130/62  Pulse: 72  Temp: 97.5 F (36.4 C)  TempSrc: Oral  Height: 5\' 8"  (1.727 m)  Weight: 175 lb (79.379 kg)  SpO2: 99%   Body mass index is 26.61 kg/(m^2). Physical Exam  Constitutional: He is oriented to person, place, and time. He appears well-developed and  well-nourished. No distress.  HENT:  Head: Normocephalic and atraumatic.  Right Ear: External ear normal.  Left Ear: External ear normal.  Nose: Nose normal.  Mouth/Throat: Oropharynx is clear and moist. No oropharyngeal exudate.  Eyes: Conjunctivae and EOM are normal. Pupils are equal, round, and reactive to light.  Neck: Normal range of motion. Neck supple. No JVD present. No thyromegaly present.  Cardiovascular:  Normal rate, regular rhythm, normal heart sounds and intact distal pulses.   Pulmonary/Chest: Effort normal and breath sounds normal. No respiratory distress.  Abdominal: Soft. Bowel sounds are normal. He exhibits no distension and no mass. There is no tenderness.  Musculoskeletal: Normal range of motion. He exhibits no edema or tenderness.  Neurological: He is alert and oriented to person, place, and time. He has normal reflexes. He displays normal reflexes. No cranial nerve deficit. He exhibits normal muscle tone.  Skin: Skin is warm and dry.  Psychiatric: He has a normal mood and affect. His behavior is normal. Judgment and thought content normal.    Labs reviewed: Basic Metabolic Panel:  Recent Labs  06/19/15  NA 140  K 4.0  BUN 13  CREATININE 1.2   Liver Function Tests:  Recent Labs  06/19/15  AST 21  ALT 19  ALKPHOS 67   No results for input(s): LIPASE, AMYLASE in the last 8760 hours. No results for input(s): AMMONIA in the last 8760 hours. CBC:  Recent Labs  06/19/15  WBC 7.1  HGB 14.0  HCT 40*  PLT 239   Lipid Panel:  Recent Labs  10/24/14 01/23/15 06/19/15  CHOL 193 185 168  HDL 49 47 41  LDLCALC 121 102 104  TRIG 169* 150 182*   Assessment/Plan 1. Right bundle branch block - chronic, EKG today w/o acute changes--NSR with RBBB, rate 69, no acute ischemia or infarct - EKG 12-Lead  2. Achilles tendon injury, right, sequela -continues with some chronic discomfort in the right achilles--continues his exercises from PT, stopped voltaren b/c  he felt he was developing increased flatus and abdominal pain from its systemic absorption--GI symptoms since dissipated and he is able to now walk again   3. Gastroesophageal reflux disease, esophagitis presence not specified -cont omeprazole as he has symptoms if he misses a dose and had a prior GI bleed  4. Hypertriglyceridemia -advised to cut back on his alcohol intake to help lower this value -LDL at goal with pravachol so continue -otherwise does fairly well with diet and exercise  5. Postnasal drip -chronic--worse in late summer/early fall, cont zyrtec  6. Vitamin D deficiency -cont 2000 units daily  Labs/tests ordered:  FLP, BMP before Next appt:  6 mos for med mgt with labs before  Mane Consolo L. Zanya Lindo, D.O. [facility] [address], [place] [address] Cell Phone (Mon-Fri 8am-5pm):  [phone] On Call:  [phone] & follow prompts after 5pm & weekends Office Phone:  [phone] Office Fax:  [phone]

## 2015-06-27 NOTE — Progress Notes (Signed)
Passed clock drawing 

## 2015-08-24 ENCOUNTER — Other Ambulatory Visit: Payer: Self-pay | Admitting: Internal Medicine

## 2015-10-10 DIAGNOSIS — H401112 Primary open-angle glaucoma, right eye, moderate stage: Secondary | ICD-10-CM | POA: Diagnosis not present

## 2015-10-10 DIAGNOSIS — H401122 Primary open-angle glaucoma, left eye, moderate stage: Secondary | ICD-10-CM | POA: Diagnosis not present

## 2015-10-10 DIAGNOSIS — H25013 Cortical age-related cataract, bilateral: Secondary | ICD-10-CM | POA: Diagnosis not present

## 2015-10-10 DIAGNOSIS — H52203 Unspecified astigmatism, bilateral: Secondary | ICD-10-CM | POA: Diagnosis not present

## 2015-11-11 ENCOUNTER — Other Ambulatory Visit: Payer: Self-pay | Admitting: Internal Medicine

## 2015-11-19 ENCOUNTER — Telehealth: Payer: Self-pay | Admitting: *Deleted

## 2015-11-19 DIAGNOSIS — Z1211 Encounter for screening for malignant neoplasm of colon: Secondary | ICD-10-CM

## 2015-11-19 NOTE — Telephone Encounter (Signed)
Patient called and stated that he would like a colonscopy set up due to it being time for him to have one. Stated that he was new to the area and doesn't have a GI Dr. Yet. Has an appointment with you to follow up in April.  Please Advise.

## 2015-11-19 NOTE — Telephone Encounter (Signed)
Ok to put in GI referral to [facility] for routine screening colonoscopy.  Also ask patient to request past colonoscopy records if we don't have anything scanned.  Thanks.

## 2015-11-20 NOTE — Telephone Encounter (Signed)
Spoke with wife and she will inform patient. Records will be coming from [place]. Referral placed.

## 2015-11-21 ENCOUNTER — Encounter: Payer: Self-pay | Admitting: Gastroenterology

## 2015-12-18 LAB — LIPID PANEL
Cholesterol: 193 mg/dL (ref 0–200)
HDL: 59 mg/dL (ref 35–70)
LDL Cholesterol: 97 mg/dL
Triglycerides: 185 mg/dL — AB (ref 40–160)

## 2015-12-18 LAB — BASIC METABOLIC PANEL
BUN: 13 mg/dL (ref 4–21)
Creatinine: 1.2 mg/dL (ref 0.6–1.3)
Glucose: 113 mg/dL
Potassium: 4.1 mmol/L (ref 3.4–5.3)
Sodium: 140 mmol/L (ref 137–147)

## 2015-12-21 ENCOUNTER — Encounter: Payer: Self-pay | Admitting: *Deleted

## 2015-12-26 ENCOUNTER — Encounter: Payer: Self-pay | Admitting: Internal Medicine

## 2015-12-26 ENCOUNTER — Non-Acute Institutional Stay: Payer: Medicare Other | Admitting: Internal Medicine

## 2015-12-26 VITALS — BP 128/62 | HR 73 | Temp 97.9°F | Ht 68.0 in | Wt 178.0 lb

## 2015-12-26 DIAGNOSIS — K219 Gastro-esophageal reflux disease without esophagitis: Secondary | ICD-10-CM

## 2015-12-26 DIAGNOSIS — E781 Pure hyperglyceridemia: Secondary | ICD-10-CM

## 2015-12-26 DIAGNOSIS — Z1211 Encounter for screening for malignant neoplasm of colon: Secondary | ICD-10-CM

## 2015-12-26 DIAGNOSIS — M19042 Primary osteoarthritis, left hand: Secondary | ICD-10-CM

## 2015-12-26 DIAGNOSIS — M1812 Unilateral primary osteoarthritis of first carpometacarpal joint, left hand: Secondary | ICD-10-CM | POA: Insufficient documentation

## 2015-12-26 NOTE — Progress Notes (Signed)
Patient ID: Ian Rowe, male   DOB: 01-26-1941, 75 y.o.   MRN: [id number]   Location:  [facility] (12)  Provider: Elah Avellino L. Mariea Rowe, D.O., C.M.D.  Code Status: DNR Goals of Care:  Advanced Directives 12/26/2015  Does patient have an advance directive? Yes  Type of Advance Directive [place]  Does patient want to make changes to advanced directive? -  Copy of advanced directive(s) in chart? Yes     Chief Complaint  Patient presents with  . Medical Management of Chronic Issues    6 mth follow-up lab results    HPI: Patient is a 75 y.o. male seen today for medical management of chronic diseases.    Discussed his wife's diagnosis of Parkinson's.  He's adjusting to it also.  Trying to enjoy things sooner rather than waiting.    Left thumb joint bothersome x 3 days, now getting better.    Has cscope scheduled for 5/10.  Wants to do this one more.  Has variable gas pains and wants the peace of mind of knowing it's ok.  Then if ok, no more.    Does fine with the prilosec for his GERD.  If has a big meal, can't lie down for an hour or so, but otherwise ok.  Still has hemorrhoids, but only one recent flare that resolved.  Past Medical History  Diagnosis Date  . GERD (gastroesophageal reflux disease)   . Hyperlipidemia   . Hemorrhoids   . RBBB   . Glaucoma     Past Surgical History  Procedure Laterality Date  . Cardiac catheterization  1993  . Colonoscopy  2002; 2007    Allergies  Allergen Reactions  . Black Pepper [[place]] Other (See Comments)    bloating  . Ciprocinonide [Fluocinolone]     bleeding  . Nsaids     History of bleeding      Medication List       This list is accurate as of: 12/26/15 11:41 AM.  Always use your most recent med list.               b complex vitamins tablet  Take 1 tablet by mouth daily.     bimatoprost 0.01 % Soln  Commonly known as:  LUMIGAN  Place 1 drop  into both eyes at bedtime.     cetirizine 10 MG tablet  Commonly known as:  ZYRTEC  Take 10 mg by mouth. Take one tablet once or twice daily as needed for allergies     folic acid [id number] MCG tablet  Commonly known as:  FOLVITE  Take 800 mcg by mouth daily.     GLUCOSAMINE CHONDR 1500 COMPLX PO  Take 1,500 mg by mouth daily. Take one daily     omeprazole 40 MG capsule  Commonly known as:  PRILOSEC  TAKE ONE TABLET DAILY FOR STOMACH     pravastatin 40 MG tablet  Commonly known as:  PRAVACHOL  ONE DAILY TO CONTROL CHOLESTEROL     timolol 0.5 % ophthalmic solution  Commonly known as:  TIMOPTIC  One drop in both eyes once a day in morning for glaucoma     vitamin C 1000 MG tablet  Take 1,000 mg by mouth daily as needed (onset of cold symptoms).     Vitamin D 2000 units Caps  Take 1 capsule (2,000 Units total) by mouth daily.        Review of Systems:  Review of Systems  Constitutional: Negative for fever, activity change, appetite change, fatigue and unexpected weight change.  HENT: Negative for hearing loss.   Eyes: Negative for visual disturbance.  Respiratory: Negative for cough, chest tightness and shortness of breath.   Cardiovascular: Negative for chest pain, palpitations and leg swelling.  Gastrointestinal: Positive for abdominal pain.       Some variable gas pains in different locations  Genitourinary: Negative for dysuria.  Musculoskeletal: Positive for arthralgias.  Neurological: Negative for dizziness, tremors and weakness.  Psychiatric/Behavioral: Negative for confusion.    Health Maintenance  Topic Date Due  . ZOSTAVAX  02/07/1999  . INFLUENZA VACCINE  04/15/2016  . TETANUS/TDAP  12/09/2023  . PNA vac Low Risk Adult  Completed    Physical Exam: Filed Vitals:   12/26/15 1104  BP: 128/62  Pulse: 73  Temp: 97.9 F (36.6 C)  TempSrc: Oral  Height: 5\' 8"  (1.727 m)  Weight: 178 lb (80.74 kg)  SpO2: 98%   Body mass index is 27.07 kg/(m^2). Physical  Exam  Constitutional: He is oriented to person, place, and time.  Cardiovascular: Normal rate, regular rhythm, normal heart sounds and intact distal pulses.   Pulmonary/Chest: Effort normal and breath sounds normal. No respiratory distress. He has no wheezes.  Abdominal: Soft. Bowel sounds are normal. He exhibits no distension. There is no tenderness.  Musculoskeletal: Normal range of motion. He exhibits tenderness.  Ulnar aspect of MCP of first digit on left hand  Neurological: He is alert and oriented to person, place, and time.  Skin: Skin is warm and dry.  Psychiatric: He has a normal mood and affect.    Labs reviewed: Basic Metabolic Panel:  Recent Labs  06/19/15 12/18/15  NA 140 140  K 4.0 4.1  BUN 13 13  CREATININE 1.2 1.2   Liver Function Tests:  Recent Labs  06/19/15  AST 21  ALT 19  ALKPHOS 67   No results for input(s): LIPASE, AMYLASE in the last 8760 hours. No results for input(s): AMMONIA in the last 8760 hours. CBC:  Recent Labs  06/19/15  WBC 7.1  HGB 14.0  HCT 40*  PLT 239   Lipid Panel:  Recent Labs  01/23/15 06/19/15 12/18/15  CHOL 185 168 193  HDL 47 41 59  LDLCALC 102 104 97  TRIG 150 182* 185*   No results found for: HGBA1C  Assessment/Plan 1. Special screening for malignant neoplasms, colon -he has arranged for a colonoscopy in May 2017 due to his gas pains   2. Gastroesophageal reflux disease, esophagitis presence not specified -cont current therapy and attempts to avoid triggering foods and following conservative techniques to prevent  3. Hypertriglyceridemia -has been getting worse -he's going to move to more sugar free desserts -cont exercise   4. Osteoarthritis of left thumb -noted today  -seems to have episodic flare ups--cont to monitor Advised that steroid injection could potentially help by ortho if it becomes a persistent problem, but feels it is getting better now  Labs/tests ordered:  Bmp, flp, hba1c before Next  appt:  6 months for annual  Ian Rowe, D.O. [facility] [address] [place], [place] [address] Cell Phone (Mon-Fri 8am-5pm):  [phone] On Call:  [phone] & follow prompts after 5pm & weekends Office Phone:  [phone] Office Fax:  [phone]

## 2016-01-02 ENCOUNTER — Ambulatory Visit (AMBULATORY_SURGERY_CENTER): Payer: Self-pay | Admitting: *Deleted

## 2016-01-02 VITALS — Ht 68.0 in | Wt 177.6 lb

## 2016-01-02 DIAGNOSIS — Z1211 Encounter for screening for malignant neoplasm of colon: Secondary | ICD-10-CM

## 2016-01-02 MED ORDER — NA SULFATE-K SULFATE-MG SULF 17.5-3.13-1.6 GM/177ML PO SOLN: 1.0000 | Freq: Once | ORAL | Status: DC

## 2016-01-02 NOTE — Progress Notes (Signed)
No egg or soy allergy known to patient  No issues with past sedation with any surgeries  or procedures, no intubation problems  No diet pills per patient No home 02 use per patient  No blood thinners per patient  Pt denies issues with constipation   

## 2016-01-03 ENCOUNTER — Telehealth: Payer: Self-pay | Admitting: Gastroenterology

## 2016-01-03 NOTE — Telephone Encounter (Signed)
Called pt and discussed his questions.  Ian Rowe//PV

## 2016-01-23 ENCOUNTER — Encounter: Payer: Self-pay | Admitting: Gastroenterology

## 2016-01-23 ENCOUNTER — Ambulatory Visit (AMBULATORY_SURGERY_CENTER): Payer: Medicare Other | Admitting: Gastroenterology

## 2016-01-23 VITALS — BP 157/84 | HR 60 | Temp 97.5°F | Resp 11 | Ht 68.0 in | Wt 177.0 lb

## 2016-01-23 DIAGNOSIS — D122 Benign neoplasm of ascending colon: Secondary | ICD-10-CM

## 2016-01-23 DIAGNOSIS — K219 Gastro-esophageal reflux disease without esophagitis: Secondary | ICD-10-CM | POA: Diagnosis not present

## 2016-01-23 DIAGNOSIS — D120 Benign neoplasm of cecum: Secondary | ICD-10-CM

## 2016-01-23 DIAGNOSIS — I4510 Unspecified right bundle-branch block: Secondary | ICD-10-CM | POA: Diagnosis not present

## 2016-01-23 DIAGNOSIS — D125 Benign neoplasm of sigmoid colon: Secondary | ICD-10-CM | POA: Diagnosis not present

## 2016-01-23 DIAGNOSIS — Z1211 Encounter for screening for malignant neoplasm of colon: Secondary | ICD-10-CM

## 2016-01-23 MED ORDER — SODIUM CHLORIDE 0.9 % IV SOLN: 500.0000 mL | INTRAVENOUS | Status: DC

## 2016-01-23 NOTE — Op Note (Signed)
Ages Patient Name: Ian Rowe Procedure Date: 01/23/2016 12:07 PM MRN: [id number] Endoscopist: Remo Lipps P. Havery Moros , MD Age: 75 Date of Birth: 12-27-1940 Gender: Male Procedure:                Colonoscopy Indications:              Screening for malignant neoplasm in the colon Medicines:                Monitored Anesthesia Care Procedure:                Pre-Anesthesia Assessment:                           - Prior to the procedure, a History and Physical                            was performed, and patient medications and                            allergies were reviewed. The patient's tolerance of                            previous anesthesia was also reviewed. The risks                            and benefits of the procedure and the sedation                            options and risks were discussed with the patient.                            All questions were answered, and informed consent                            was obtained. Prior Anticoagulants: The patient has                            taken no previous anticoagulant or antiplatelet                            agents. ASA Grade Assessment: II - A patient with                            mild systemic disease. After reviewing the risks                            and benefits, the patient was deemed in                            satisfactory condition to undergo the procedure.                           After obtaining informed consent, the colonoscope  was passed under direct vision. Throughout the                            procedure, the patient's blood pressure, pulse, and                            oxygen saturations were monitored continuously. The                            Model CF-HQ190L [id number]) scope was introduced                            through the anus and advanced to the the cecum,                            identified by appendiceal orifice and ileocecal                             valve. The colonoscopy was performed without                            difficulty. The patient tolerated the procedure                            well. The quality of the bowel preparation was                            adequate. The ileocecal valve, appendiceal orifice,                            and rectum were photographed. Scope In: 12:14:09 PM Scope Out: 12:35:34 PM Scope Withdrawal Time: 0 hours 18 minutes 39 seconds  Total Procedure Duration: 0 hours 21 minutes 25 seconds  Findings:                 The perianal and digital rectal examinations were                            normal.                           A 4 mm polyp was found in the cecum. The polyp was                            sessile. The polyp was removed with a cold snare.                            Resection and retrieval were complete.                           A diminutive polyp was found in the ascending                            colon. The polyp was sessile. The polyp was removed  with a cold biopsy forceps. Resection and retrieval                            were complete.                           A 6 mm polyp was found in the ascending colon. The                            polyp was pedunculated. The polyp was removed with                            a hot snare. Resection and retrieval were complete.                           A 20 mm polyp was found in the sigmoid colon. The                            polyp was pedunculated. The polyp was removed with                            a hot snare. Resection and retrieval were complete.                           Non-bleeding internal hemorrhoids were found during                            retroflexion.                           The exam was otherwise without abnormality. Complications:            No immediate complications. Estimated blood loss:                            Minimal. Estimated Blood Loss:     Estimated  blood loss was minimal. Impression:               - One 4 mm polyp in the cecum, removed with a cold                            snare. Resected and retrieved.                           - One diminutive polyp in the ascending colon,                            removed with a cold biopsy forceps. Resected and                            retrieved.                           - One 6 mm polyp in the ascending colon, removed  with a hot snare. Resected and retrieved.                           - One 20 mm polyp in the sigmoid colon, removed                            with a hot snare. Resected and retrieved.                           - Non-bleeding internal hemorrhoids.                           - The examination was otherwise normal. Recommendation:           - Patient has a contact number available for                            emergencies. The signs and symptoms of potential                            delayed complications were discussed with the                            patient. Return to normal activities tomorrow.                            Written discharge instructions were provided to the                            patient.                           - Resume previous diet.                           - Continue present medications.                           - No aspirin, ibuprofen, naproxen, or other                            non-steroidal anti-inflammatory drugs for 2 weeks                            after polyp removal.                           - Await pathology results.                           - Repeat colonoscopy is recommended for                            surveillance. The colonoscopy date will be                            determined after  pathology results from today's                            exam become available for review. Remo Lipps P. Armbruster, MD 01/23/2016 12:39:25 PM This report has been signed electronically.

## 2016-01-23 NOTE — Progress Notes (Signed)
To recovery vss  Report to RN

## 2016-01-23 NOTE — Patient Instructions (Signed)
YOU HAD AN ENDOSCOPIC PROCEDURE TODAY AT [facility]:   Refer to the procedure report that was given to you for any specific questions about what was found during the examination.  If the procedure report does not answer your questions, please call your gastroenterologist to clarify.  If you requested that your care partner not be given the details of your procedure findings, then the procedure report has been included in a sealed envelope for you to review at your convenience later.  YOU SHOULD EXPECT: Some feelings of bloating in the abdomen. Passage of more gas than usual.  Walking can help get rid of the air that was put into your GI tract during the procedure and reduce the bloating. If you had a lower endoscopy (such as a colonoscopy or flexible sigmoidoscopy) you may notice spotting of blood in your stool or on the toilet paper. If you underwent a bowel prep for your procedure, you may not have a normal bowel movement for a few days.  Please Note:  You might notice some irritation and congestion in your nose or some drainage.  This is from the oxygen used during your procedure.  There is no need for concern and it should clear up in a day or so.  SYMPTOMS TO REPORT IMMEDIATELY:   Following lower endoscopy (colonoscopy or flexible sigmoidoscopy):  Excessive amounts of blood in the stool  Significant tenderness or worsening of abdominal pains  Swelling of the abdomen that is new, acute  Fever of 100F or higher   For urgent or emergent issues, a gastroenterologist can be reached at any hour by calling [phone].   DIET: Your first meal following the procedure should be a small meal and then it is ok to progress to your normal diet. Heavy or fried foods are harder to digest and may make you feel nauseous or bloated.  Likewise, meals heavy in dairy and vegetables can increase bloating.  Drink plenty of fluids but you should avoid alcoholic beverages for 24  hours.  ACTIVITY:  You should plan to take it easy for the rest of today and you should NOT DRIVE or use heavy machinery until tomorrow (because of the sedation medicines used during the test).    FOLLOW UP: Our staff will call the number listed on your records the next business day following your procedure to check on you and address any questions or concerns that you may have regarding the information given to you following your procedure. If we do not reach you, we will leave a message.  However, if you are feeling well and you are not experiencing any problems, there is no need to return our call.  We will assume that you have returned to your regular daily activities without incident.  If any biopsies were taken you will be contacted by phone or by letter within the next 1-3 weeks.  Please call us at [phone] if you have not heard about the biopsies in 3 weeks.    SIGNATURES/CONFIDENTIALITY: You and/or your care partner have signed paperwork which will be entered into your electronic medical record.  These signatures attest to the fact that that the information above on your After Visit Summary has been reviewed and is understood.  Full responsibility of the confidentiality of this discharge information lies with you and/or your care-partner.  Polyp and hemorrhoid information given.  No aspirin, ibuprofen, naproxen, or any other non-steroidal anti-inflammatory medication for 2 weeks.

## 2016-01-23 NOTE — Progress Notes (Signed)
Called to room to assist during endoscopic procedure.  Patient ID and intended procedure confirmed with present staff. Received instructions for my participation in the procedure from the performing physician.  

## 2016-01-24 ENCOUNTER — Telehealth: Payer: Self-pay

## 2016-01-24 NOTE — Telephone Encounter (Signed)
  Follow up Call-  Call back number 01/23/2016  Post procedure Call Back phone  # [phone]  Permission to leave phone message Yes     Patient questions:  Do you have a fever, pain , or abdominal swelling? No. Pain Score  0 *  Have you tolerated food without any problems? Yes.    Have you been able to return to your normal activities? Yes.    Do you have any questions about your discharge instructions: Diet   No. Medications  No. Follow up visit  No.  Do you have questions or concerns about your Care? No.  Actions: * If pain score is 4 or above: No action needed, pain <4.

## 2016-01-30 ENCOUNTER — Encounter: Payer: Self-pay | Admitting: Gastroenterology

## 2016-02-18 ENCOUNTER — Other Ambulatory Visit: Payer: Self-pay | Admitting: *Deleted

## 2016-02-18 MED ORDER — OMEPRAZOLE 40 MG PO CPDR: DELAYED_RELEASE_CAPSULE | ORAL | Status: DC

## 2016-02-18 NOTE — Telephone Encounter (Signed)
[employer]

## 2016-04-08 DIAGNOSIS — H401122 Primary open-angle glaucoma, left eye, moderate stage: Secondary | ICD-10-CM | POA: Diagnosis not present

## 2016-04-08 DIAGNOSIS — Z0100 Encounter for examination of eyes and vision without abnormal findings: Secondary | ICD-10-CM | POA: Diagnosis not present

## 2016-04-08 DIAGNOSIS — H401112 Primary open-angle glaucoma, right eye, moderate stage: Secondary | ICD-10-CM | POA: Diagnosis not present

## 2016-06-17 DIAGNOSIS — E119 Type 2 diabetes mellitus without complications: Secondary | ICD-10-CM | POA: Diagnosis not present

## 2016-06-17 DIAGNOSIS — E781 Pure hyperglyceridemia: Secondary | ICD-10-CM | POA: Diagnosis not present

## 2016-06-17 DIAGNOSIS — E785 Hyperlipidemia, unspecified: Secondary | ICD-10-CM | POA: Diagnosis not present

## 2016-06-17 DIAGNOSIS — Z5181 Encounter for therapeutic drug level monitoring: Secondary | ICD-10-CM | POA: Diagnosis not present

## 2016-06-17 LAB — LIPID PANEL
Cholesterol: 179 mg/dL (ref 0–200)
HDL: 45 mg/dL (ref 35–70)
LDL Cholesterol: 96 mg/dL
Triglycerides: 188 mg/dL — AB (ref 40–160)

## 2016-06-17 LAB — HEMOGLOBIN A1C: Hemoglobin A1C: 6.3

## 2016-06-17 LAB — BASIC METABOLIC PANEL
BUN: 9 mg/dL (ref 4–21)
Creatinine: 1 mg/dL (ref 0.6–1.3)
Glucose: 107 mg/dL
Potassium: 4 mmol/L (ref 3.4–5.3)
Sodium: 140 mmol/L (ref 137–147)

## 2016-06-19 DIAGNOSIS — Z23 Encounter for immunization: Secondary | ICD-10-CM | POA: Diagnosis not present

## 2016-06-25 ENCOUNTER — Non-Acute Institutional Stay: Payer: Medicare Other | Admitting: Internal Medicine

## 2016-06-25 ENCOUNTER — Encounter: Payer: Self-pay | Admitting: Internal Medicine

## 2016-06-25 VITALS — BP 130/70 | HR 64 | Temp 98.7°F | Ht 68.0 in | Wt 180.0 lb

## 2016-06-25 DIAGNOSIS — E782 Mixed hyperlipidemia: Secondary | ICD-10-CM

## 2016-06-25 DIAGNOSIS — Z23 Encounter for immunization: Secondary | ICD-10-CM | POA: Diagnosis not present

## 2016-06-25 DIAGNOSIS — M7540 Impingement syndrome of unspecified shoulder: Secondary | ICD-10-CM

## 2016-06-25 DIAGNOSIS — K219 Gastro-esophageal reflux disease without esophagitis: Secondary | ICD-10-CM | POA: Diagnosis not present

## 2016-06-25 NOTE — Progress Notes (Signed)
Location:  Occupational psychologist of Service:  Clinic (12)  Provider: Deonta Bomberger L. Mariea Rowe, D.O., C.M.D.  Code Status: DNR Goals of Care:  Advanced Directives 06/25/2016  Does patient have an advance directive? Yes  Type of Advance Directive [place]  Does patient want to make changes to advanced directive? No - Patient declined  Copy of advanced directive(s) in chart? Yes  Pre-existing out of facility DNR order (yellow form or pink MOST form) -     Chief Complaint  Patient presents with  . Medical Management of Chronic Issues    6 mth follow-up    HPI: Patient is a 75 y.o. male seen today for medical management of chronic diseases.    Shoulders are better than they were with exercises in gym.  Is using the machines for range of motion exercises.  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  He worries about his wife that she will burnout that she exercises too much.  Has had some improvements though.    Has begun to eat the sugar free dessert 2-3 times per week.  Can't resist the lemon meringue pie which is not sugar free.  He is eating less food than he once did.  He eats the smaller portions.  Reviewed hba1c being 6.3.  Stopped bread with every meal.  Does drink a fair amount of alcohol--his 2 scotches at night instead of sleepy pills and drinks 1/2 glass of wine.  Says he'll try to drink a little less as triglycerides elevated.    If he eats the wrong thing, he needs two pillows but omeprazole helps.  Glaucoma--has regular checkups, but no recent problems--now on every 6 mos schedule.   Past Medical History:  Diagnosis Date  . Allergy    seasonal  . Arthritis    knees  . GERD (gastroesophageal reflux disease)   . Glaucoma   . Hemorrhoids   . Hyperlipidemia   . RBBB     Past Surgical History:  Procedure Laterality Date  . CARDIAC CATHETERIZATION  1993  . COLONOSCOPY  2002; 2007  . POLYPECTOMY      Allergies    Allergen Reactions  . Black Pepper [[place]] Other (See Comments)    bloating  . Ciprocinonide [Fluocinolone]     bleeding  . Nsaids     History of bleeding      Medication List       Accurate as of 06/25/16 11:30 AM. Always use your most recent med list.          b complex vitamins tablet Take 1 tablet by mouth daily.   bimatoprost 0.01 % Soln Commonly known as:  LUMIGAN Place 1 drop into both eyes at bedtime.   cetirizine 10 MG tablet Commonly known as:  ZYRTEC Take 10 mg by mouth. Take one tablet once or twice daily as needed for allergies   folic acid [id number] MCG tablet Commonly known as:  FOLVITE Take 800 mcg by mouth daily.   GLUCOSAMINE CHONDR 1500 COMPLX PO Take 1,500 mg by mouth daily. Take one daily   omeprazole 40 MG capsule Commonly known as:  PRILOSEC Take one tablet by mouth once daily for stomach   pravastatin 40 MG tablet Commonly known as:  PRAVACHOL ONE DAILY TO CONTROL CHOLESTEROL   PSEUDOEPHEDRINE HCL PO Take by mouth as needed. Reported on 01/02/2016   timolol 0.5 % ophthalmic solution Commonly known as:  TIMOPTIC One drop in both eyes once  a day in morning for glaucoma   vitamin C 1000 MG tablet Take 1,000 mg by mouth daily as needed (onset of cold symptoms). Reported on 01/02/2016   Vitamin D 2000 units Caps Take 1 capsule (2,000 Units total) by mouth daily.      Review of Systems:  Review of Systems  Constitutional: Negative for chills and fever.  HENT: Negative for congestion.   Eyes: Negative for blurred vision.       Glasses and glaucoma  Respiratory: Negative for cough and shortness of breath.   Cardiovascular: Negative for chest pain, palpitations and leg swelling.  Gastrointestinal: Negative for abdominal pain, blood in stool, constipation and melena.  Genitourinary: Negative for dysuria.  Musculoskeletal: Positive for joint pain. Negative for falls and myalgias.  Skin: Negative for itching and rash.  Neurological:  Negative for dizziness, loss of consciousness and headaches.  Endo/Heme/Allergies: Does not bruise/bleed easily.  Psychiatric/Behavioral: Negative for depression and memory loss. The patient is not nervous/anxious and does not have insomnia.     Health Maintenance  Topic Date Due  . ZOSTAVAX  02/07/1999  . COLONOSCOPY  01/23/2019  . TETANUS/TDAP  12/09/2023  . INFLUENZA VACCINE  Completed  . PNA vac Low Risk Adult  Completed    Physical Exam: Vitals:   06/25/16 1113  BP: 130/70  Pulse: 64  Temp: 98.7 F (37.1 C)  TempSrc: Oral  SpO2: 98%  Weight: 180 lb (81.6 kg)  Height: 5\' 8"  (1.727 m)   Body mass index is 27.37 kg/m. Physical Exam  Constitutional: He is oriented to person, place, and time. He appears well-developed and well-nourished. No distress.  Cardiovascular: Normal rate, regular rhythm, normal heart sounds and intact distal pulses.   Pulmonary/Chest: Effort normal and breath sounds normal. No respiratory distress.  Abdominal: Bowel sounds are normal.  Musculoskeletal: Normal range of motion. He exhibits no tenderness.  Neurological: He is alert and oriented to person, place, and time.    Labs reviewed: Basic Metabolic Panel:  Recent Labs  12/18/15 06/17/16 0700  NA 140 140  K 4.1 4.0  BUN 13 9  CREATININE 1.2 1.0   Liver Function Tests: No results for input(s): AST, ALT, ALKPHOS, BILITOT, PROT, ALBUMIN in the last 8760 hours. No results for input(s): LIPASE, AMYLASE in the last 8760 hours. No results for input(s): AMMONIA in the last 8760 hours. CBC: No results for input(s): WBC, NEUTROABS, HGB, HCT, MCV, PLT in the last 8760 hours. Lipid Panel:  Recent Labs  12/18/15 06/17/16 0700  CHOL 193 179  HDL 59 45  LDLCALC 97 96  TRIG 185* 188*   Lab Results  Component Value Date   HGBA1C 6.3 06/17/2016    Assessment/Plan 1. Rotator cuff impingement syndrome, unspecified laterality -previous problem that recently flared up -it's better since he  resumed some upper extremity exercises at the gym  2. Gastroesophageal reflux disease, esophagitis presence not specified -stable on prilosec  3. Mixed hyperlipidemia -continues on pravachol, TG remain elevated due to alcohol intake and desserts--he will try to reduce both before his AWV  -f/u hba1c and flp before next visit due to this and hyperglycemia  4. Need for Zostavax administration -Rx provided to get at pharmacy  Labs/tests ordered:  Hba1c, flp Next appt:  6 mos for annual wellness and cpe  Ian Rowe L. Destani Wamser, D.O. [facility] [address], [place] [address] Cell Phone (Mon-Fri 8am-5pm):  [phone] On Call:  [phone] & follow prompts  after 5pm & weekends Office Phone:  [phone] Office Fax:  [phone]

## 2016-08-18 ENCOUNTER — Other Ambulatory Visit: Payer: Self-pay | Admitting: Internal Medicine

## 2016-10-15 DIAGNOSIS — H401132 Primary open-angle glaucoma, bilateral, moderate stage: Secondary | ICD-10-CM | POA: Diagnosis not present

## 2016-10-15 DIAGNOSIS — H25013 Cortical age-related cataract, bilateral: Secondary | ICD-10-CM | POA: Diagnosis not present

## 2016-11-04 ENCOUNTER — Other Ambulatory Visit: Payer: Self-pay | Admitting: Internal Medicine

## 2016-12-22 ENCOUNTER — Telehealth: Payer: Self-pay | Admitting: *Deleted

## 2016-12-22 NOTE — Telephone Encounter (Signed)
Fax sent.

## 2016-12-22 NOTE — Telephone Encounter (Signed)
Patient called and stated that he has an appointment with Dr. Mariea Clonts coming up and has labs tomorrow at [facility] and wants a Uric Acid added to it because he is having symptoms of gout and wants it checked. Please Advise.

## 2016-12-22 NOTE — Telephone Encounter (Signed)
Ok, we will need to contact the [facility] nurse at [place] to add uric acid to his labs there tomorrow.

## 2016-12-23 ENCOUNTER — Encounter: Payer: Self-pay | Admitting: Internal Medicine

## 2016-12-23 DIAGNOSIS — E781 Pure hyperglyceridemia: Secondary | ICD-10-CM | POA: Diagnosis not present

## 2016-12-23 DIAGNOSIS — E785 Hyperlipidemia, unspecified: Secondary | ICD-10-CM | POA: Diagnosis not present

## 2016-12-23 DIAGNOSIS — E119 Type 2 diabetes mellitus without complications: Secondary | ICD-10-CM | POA: Diagnosis not present

## 2016-12-23 DIAGNOSIS — M109 Gout, unspecified: Secondary | ICD-10-CM | POA: Diagnosis not present

## 2016-12-23 DIAGNOSIS — R7309 Other abnormal glucose: Secondary | ICD-10-CM | POA: Diagnosis not present

## 2016-12-23 LAB — LIPID PANEL
Cholesterol: 180 mg/dL (ref 0–200)
HDL: 45 mg/dL (ref 35–70)
LDL Cholesterol: 97 mg/dL
Triglycerides: 194 mg/dL — AB (ref 40–160)

## 2016-12-23 LAB — HEMOGLOBIN A1C
Hemoglobin A1C: 6.5
Hemoglobin A1C: 6.5

## 2016-12-31 ENCOUNTER — Non-Acute Institutional Stay: Payer: Medicare Other | Admitting: Internal Medicine

## 2016-12-31 ENCOUNTER — Encounter: Payer: Self-pay | Admitting: Internal Medicine

## 2016-12-31 VITALS — BP 130/70 | HR 72 | Temp 98.6°F | Ht 68.0 in | Wt 177.0 lb

## 2016-12-31 DIAGNOSIS — M109 Gout, unspecified: Secondary | ICD-10-CM | POA: Diagnosis not present

## 2016-12-31 DIAGNOSIS — Z0000 Encounter for general adult medical examination without abnormal findings: Secondary | ICD-10-CM | POA: Diagnosis not present

## 2016-12-31 DIAGNOSIS — K219 Gastro-esophageal reflux disease without esophagitis: Secondary | ICD-10-CM

## 2016-12-31 DIAGNOSIS — M170 Bilateral primary osteoarthritis of knee: Secondary | ICD-10-CM

## 2016-12-31 DIAGNOSIS — R739 Hyperglycemia, unspecified: Secondary | ICD-10-CM

## 2016-12-31 DIAGNOSIS — E782 Mixed hyperlipidemia: Secondary | ICD-10-CM | POA: Diagnosis not present

## 2016-12-31 NOTE — Progress Notes (Signed)
Location:  Occupational psychologist of Service:  Clinic (12) Provider: Magin Balbi L. Mariea Clonts, D.O., C.M.D.  Patient Care Team: Gayland Curry, DO as PCP - General (Geriatric Medicine) [facility]  Extended Emergency Contact Information Primary Emergency Contact: Louin Address: [address]          [place], [place] [address] Johnnette Litter of [place] Phone: [phone] Relation: Spouse  Code Status: DNR Goals of Care: Advanced Directive information Advanced Directives 12/31/2016  Does Patient Have a Medical Advance Directive? Yes  Type of Advance Directive [place]  Does patient want to make changes to medical advance directive? -  Copy of [place] in Chart? Yes  Pre-existing out of facility DNR order (yellow form or pink MOST form) -     Chief Complaint  Patient presents with  . Annual Exam    wellness exam  . MMSE    30/30 passed clock    HPI: Patient is a 76 y.o. male seen in today for an annual wellness exam.   . Had cow's liver for dinner and great toes swelled up afterwards.  Suspected he had gout. Has not kept him from walking.  Left toe was gouty and right toe also has funny looking broken, discolored nail.  Mentions how his wife's parkinson's is affecting their relationship some due to how they cannot do things together all of the time now due to her fatigue.  Depression screen [facility] 2/9 12/31/2016 06/25/2016 12/26/2015 06/27/2015 01/30/2015  Decreased Interest 0 0 0 0 0  Down, Depressed, Hopeless 0 0 0 0 0  PHQ - 2 Score 0 0 0 0 0    Fall Risk  12/31/2016 06/25/2016 12/26/2015 01/30/2015  Falls in the past year? No No No No   MMSE - Mini Mental State Exam 12/31/2016 06/27/2015  Orientation to time 5 5  Orientation to Place 5 5  Registration 3 3  Attention/ Calculation 5 5  Recall 3 3  Language- name 2 objects 2 2  Language- repeat 1 1  Language- follow 3 step command  3 3  Language- read & follow direction 1 1  Write a sentence 1 1  Copy design 1 1  Total score 30 30     Health Maintenance  Topic Date Due  . INFLUENZA VACCINE  04/15/2017  . COLONOSCOPY  01/23/2019  . TETANUS/TDAP  12/09/2023  . PNA vac Low Risk Adult  Completed    Functional Status Survey: Is the patient deaf or have difficulty hearing?: No Does the patient have difficulty seeing, even when wearing glasses/contacts?: No Does the patient have difficulty concentrating, remembering, or making decisions?: No (occasional word and name forgetting) Does the patient have difficulty walking or climbing stairs?: No (some knee discomfort going down stairs or getting out of cha) Does the patient have difficulty dressing or bathing?: No Does the patient have difficulty doing errands alone such as visiting a doctor's office or shopping?: No Current Exercise Habits: Structured exercise class, Type of exercise: walking, Time (Minutes): 45, Frequency (Times/Week): 7, Weekly Exercise (Minutes/Week): 315, Intensity: Moderate Exercise limited by: orthopedic condition(s)  Diet?  Low fat diet Hearing:  No problem Dentition:  No problems sees dentist regularly Pain:  Great toes, bilateral knees when going down stairs  Past Medical History:  Diagnosis Date  . Allergy    seasonal  . Arthritis    knees  . GERD (gastroesophageal reflux disease)   . Glaucoma   .  Hemorrhoids   . Hyperlipidemia   . RBBB     Past Surgical History:  Procedure Laterality Date  . CARDIAC CATHETERIZATION  1993  . COLONOSCOPY  2002; 2007  . POLYPECTOMY      Family History  Problem Relation Age of Onset  . Alzheimer's disease Mother   . Heart disease Father   . Colon cancer Neg Hx   . Colon polyps Neg Hx     Social History   Social History  . Marital status: Married    Spouse name: Mardene Celeste  . Number of children: 2  . Years of education: N/A   Occupational History  . Retired Ship broker    Social History Main Topics  . Smoking status: Never Smoker  . Smokeless tobacco: Never Used  . Alcohol use 4.2 oz/week    7 Shots of liquor per week     Comment: 1-2 scotch at bedtime nightly, occ glass of wine   . Drug use: No  . Sexual activity: Not Asked   Other Topics Concern  . None   Social History Narrative   Moved to [place] 2014.   Married   Never smoked   Alcohol - scotch or wine nightly   Walks 150 minutes per week.    POA    reports that he has never smoked. He has never used smokeless tobacco. He reports that he drinks about 4.2 oz of alcohol per week . He reports that he does not use drugs.  Allergies as of 12/31/2016      Reactions   Black Pepper [piper] Other (See Comments)   bloating   Ciprocinonide [fluocinolone]    bleeding   Nsaids    History of bleeding      Medication List       Accurate as of 12/31/16 11:18 AM. Always use your most recent med list.          b complex vitamins tablet Take 1 tablet by mouth daily.   bimatoprost 0.01 % Soln Commonly known as:  LUMIGAN Place 1 drop into both eyes at bedtime.   cetirizine 10 MG tablet Commonly known as:  ZYRTEC Take 10 mg by mouth. Take one tablet once or twice daily as needed for allergies   folic acid 950 MCG tablet Commonly known as:  FOLVITE Take 800 mcg by mouth daily.   GLUCOSAMINE CHONDR 1500 COMPLX PO Take 1,500 mg by mouth daily. Take one daily   omeprazole 40 MG capsule Commonly known as:  PRILOSEC TAKE ONE CAPSULE BY MOUTH DAILY   pravastatin 40 MG tablet Commonly known as:  PRAVACHOL TAKE 1 TABLET BY MOUTH ONCE DAILY TO CONTROL CHOLESTEROL   PSEUDOEPHEDRINE HCL PO Take by mouth as needed. Reported on 01/02/2016   timolol 0.5 % ophthalmic solution Commonly known as:  TIMOPTIC One drop in both eyes once a day in morning for glaucoma   vitamin C 1000 MG tablet Take 1,000 mg by mouth daily as needed (onset of cold symptoms). Reported  on 01/02/2016   Vitamin D 2000 units Caps Take 1 capsule (2,000 Units total) by mouth daily.       Review of Systems:  Review of Systems  Constitutional: Negative for chills, fever and malaise/fatigue.  HENT: Negative for hearing loss.   Eyes: Negative for blurred vision.       Glasses, eye drops  Respiratory: Negative for cough and shortness of breath.   Cardiovascular: Negative for chest pain, palpitations and leg swelling.  Gastrointestinal:  Negative for abdominal pain, blood in stool, constipation and melena.  Genitourinary: Negative for dysuria.  Musculoskeletal: Negative for falls.       Gout left great toe, callous and ruptured blood blister and fungus right great toe  Neurological: Negative for dizziness, loss of consciousness and weakness.  Endo/Heme/Allergies: Does not bruise/bleed easily.  Psychiatric/Behavioral: Negative for depression and memory loss.    Physical Exam: Vitals:   12/31/16 0958  BP: 130/70  Pulse: 72  Temp: 98.6 F (37 C)  TempSrc: Oral  SpO2: 96%  Weight: 177 lb (80.3 kg)  Height: 5\' 8"  (1.727 m)   Body mass index is 26.91 kg/m. Physical Exam  Constitutional: He is oriented to person, place, and time. He appears well-developed and well-nourished. No distress.  HENT:  Head: Normocephalic and atraumatic.  Right Ear: External ear normal.  Left Ear: External ear normal.  Nose: Nose normal.  Mouth/Throat: Oropharynx is clear and moist. No oropharyngeal exudate.  Increased cerumen--mild  Eyes: Conjunctivae and EOM are normal. Pupils are equal, round, and reactive to light.  glasses  Neck: Neck supple. No JVD present.  Cardiovascular: Normal rate, regular rhythm, normal heart sounds and intact distal pulses.   Pulmonary/Chest: Effort normal and breath sounds normal. No respiratory distress.  Abdominal: Soft. Bowel sounds are normal. He exhibits no distension. There is no tenderness.  Musculoskeletal: Normal range of motion. He exhibits  tenderness.  Slight tenderness and erythema of left great toe, right with ruptured blister medially and callous near nail, thickened medial nail  Lymphadenopathy:    He has no cervical adenopathy.  Neurological: He is alert and oriented to person, place, and time. He displays normal reflexes. No cranial nerve deficit.  Skin: Skin is warm and dry.  Psychiatric: He has a normal mood and affect. His behavior is normal. Judgment and thought content normal.    Labs reviewed: Basic Metabolic Panel:  Recent Labs  06/17/16 0700  NA 140  K 4.0  BUN 9  CREATININE 1.0   Liver Function Tests: No results for input(s): AST, ALT, ALKPHOS, BILITOT, PROT, ALBUMIN in the last 8760 hours. No results for input(s): LIPASE, AMYLASE in the last 8760 hours. No results for input(s): AMMONIA in the last 8760 hours. CBC: No results for input(s): WBC, NEUTROABS, HGB, HCT, MCV, PLT in the last 8760 hours. Lipid Panel:  Recent Labs  06/17/16 0700 12/23/16 0300  CHOL 179 180  HDL 45 45  LDLCALC 96 97  TRIG 188* 194*   Lab Results  Component Value Date   HGBA1C 6.5 12/23/2016   Uric acid 7.7  Assessment/Plan 1. Mixed hyperlipidemia - ongoing, cont pravachol, diet and exercise--discussed TG elevation related to two scotches and sweets - EKG 12-Lead unchanged from previous years  2. 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 Gastroesophageal reflux disease, esophagitis presence not specified -cont omeprazole therapy, did not do well when he tried to come off of this before  4. Medicare annual wellness visit, subsequent -performed today see  above -[facility] will be offered as a facility clinic through the long term care pharmacy later this year  5. Bilateral primary osteoarthritis of knee -seems to  be progressing and now bothersome when going down stairs, not bad enough to take meds, uses glucosamine   6. Hyperglycemia -has been trending up, counseled on diet and increasing walking program  Labs/tests ordered: cbc, cmp, flp, hba1c, uric acid  Next appt:  6 mos med mgt, labs before  Aeralyn Barna L. Deyra Perdomo, D.O. [facility] [address],  [address] Cell Phone (Mon-Fri 8am-5pm):  [phone] On Call:  [phone] & follow prompts after 5pm & weekends Office Phone:  [phone] Office Fax:  [phone]

## 2016-12-31 NOTE — Patient Instructions (Addendum)
Low-Purine Diet Purines are compounds that affect the level of uric acid in your body. A low-purine diet is a diet that is low in purines. Eating a low-purine diet can prevent the level of uric acid in your body from getting too high and causing gout or kidney stones or both. What do I need to know about this diet?  Choose low-purine foods. Examples of low-purine foods are listed in the next section.  Drink plenty of fluids, especially water. Fluids can help remove uric acid from your body. Try to drink 8-16 cups (1.9-3.8 L) a day.  Limit foods high in fat, especially saturated fat, as fat makes it harder for the body to get rid of uric acid. Foods high in saturated fat include pizza, cheese, ice cream, whole milk, fried foods, and gravies. Choose foods that are lower in fat and lean sources of protein. Use olive oil when cooking as it contains healthy fats that are not high in saturated fat.  Limit alcohol. Alcohol interferes with the elimination of uric acid from your body. If you are having a gout attack, avoid all alcohol.  Keep in mind that different people's bodies react differently to different foods. You will probably learn over time which foods do or do not affect you. If you discover that a food tends to cause your gout to flare up, avoid eating that food. You can more freely enjoy foods that do not cause problems. If you have any questions about a food item, talk to your dietitian or health care provider. Which foods are low, moderate, and high in purines? The following is a list of foods that are low, moderate, and high in purines. You can eat any amount of the foods that are low in purines. You may be able to have small amounts of foods that are moderate in purines. Ask your health care provider how much of a food moderate in purines you can have. Avoid foods high in purines. Grains   Foods low in purines: Enriched white bread, pasta, rice, cake, cornbread, popcorn.  Foods moderate in  purines: Whole-grain breads and cereals, wheat germ, bran, oatmeal. Uncooked oatmeal. Dry wheat bran or wheat germ.  Foods high in purines: Pancakes, [place] toast, biscuits, muffins. Vegetables   Foods low in purines: All vegetables, except those that are moderate in purines.  Foods moderate in purines: Asparagus, cauliflower, spinach, mushrooms, green peas. Fruits   All fruits are low in purines. Meats and other Protein Foods   Foods low in purines: Eggs, nuts, peanut butter.  Foods moderate in purines: 80-90% lean beef, lamb, veal, pork, poultry, fish, eggs, peanut butter, nuts. Crab, lobster, oysters, and shrimp. Cooked dried beans, peas, and lentils.  Foods high in purines: Anchovies, sardines, herring, mussels, tuna, codfish, scallops, trout, and haddock. Ian Rowe. Organ meats (such as liver or kidney). Tripe. Game meat. Goose. Sweetbreads. Dairy   All dairy foods are low in purines. Low-fat and fat-free dairy products are best because they are low in saturated fat. Beverages   Drinks low in purines: Water, carbonated beverages, tea, coffee, cocoa.  Drinks moderate in purines: Soft drinks and other drinks sweetened with high-fructose corn syrup. Juices. To find whether a food or drink is sweetened with high-fructose corn syrup, look at the ingredients list.  Drinks high in purines: Alcoholic beverages (such as beer). Condiments   Foods low in purines: Salt, herbs, olives, pickles, relishes, vinegar.  Foods moderate in purines: Butter, margarine, oils, mayonnaise. Fats and Oils  and sauces made with meat.  Foods high in purines: Gravies and sauces made with meat. Other Foods  Foods low in purines: Sugars, sweets, gelatin. Cake. Soups made without meat.  Foods moderate in purines: Meat-based or fish-based soups, broths, or bouillons. Foods and drinks sweetened with high-fructose corn syrup.  Foods high in purines: High-fat desserts  (such as ice cream, cookies, cakes, pies, doughnuts, and chocolate). Contact your dietitian for more information on foods that are not listed here. This information is not intended to replace advice given to you by your health care provider. Make sure you discuss any questions you have with your health care provider. Document Released: 12/27/2010 Document Revised: 02/07/2016 Document Reviewed: 08/08/2013 Elsevier Interactive Patient Education  2017 Elsevier Inc.  Gout Gout is painful swelling that can occur in some of your joints. Gout is a type of arthritis. This condition is caused by having too much uric acid in your body. Uric acid is a chemical that forms when your body breaks down substances called purines. Purines are important for building body proteins. When your body has too much uric acid, sharp crystals can form and build up inside your joints. This causes pain and swelling. Gout attacks can happen quickly and be very painful (acute gout). Over time, the attacks can affect more joints and become more frequent (chronic gout). Gout can also cause uric acid to build up under your skin and inside your kidneys. What are the causes? This condition is caused by too much uric acid in your blood. This can occur because:  Your kidneys do not remove enough uric acid from your blood. This is the most common cause.  Your body makes too much uric acid. This can occur with some cancers and cancer treatments. It can also occur if your body is breaking down too many red blood cells (hemolytic anemia).  You eat too many foods that are high in purines. These foods include organ meats and some seafood. Alcohol, especially beer, is also high in purines.  A gout attack may be triggered by trauma or stress. What increases the risk? This condition is more likely to develop in people who:  Have a family history of gout.  Are male and middle-aged.  Are male and have gone through menopause.  Are  obese.  Frequently drink alcohol, especially beer.  Are dehydrated.  Lose weight too quickly.  Have an organ transplant.  Have lead poisoning.  Take certain medicines, including aspirin, cyclosporine, diuretics, levodopa, and niacin.  Have kidney disease or psoriasis.  What are the signs or symptoms? An attack of acute gout happens quickly. It usually occurs in just one joint. The most common place is the big toe. Attacks often start at night. Other joints that may be affected include joints of the feet, ankle, knee, fingers, wrist, or elbow. Symptoms may include:  Severe pain.  Warmth.  Swelling.  Stiffness.  Tenderness. The affected joint may be very painful to touch.  Shiny, red, or purple skin.  Chills and fever.  Chronic gout may cause symptoms more frequently. More joints may be involved. You may also have white or yellow lumps (tophi) on your hands or feet or in other areas near your joints. How is this diagnosed? This condition is diagnosed based on your symptoms, medical history, and physical exam. You may have tests, such as:  Blood tests to measure uric acid levels.  Removal of joint fluid with a needle (aspiration) to look for uric acid crystals.    X-rays to look for joint damage.  How is this treated? Treatment for this condition has two phases: treating an acute attack and preventing future attacks. Acute gout treatment may include medicines to reduce pain and swelling, including:  NSAIDs.  Steroids. These are strong anti-inflammatory medicines that can be taken by mouth (orally) or injected into a joint.  Colchicine. This medicine relieves pain and swelling when it is taken soon after an attack. It can be given orally or through an IV tube.  Preventive treatment may include:  Daily use of smaller doses of NSAIDs or colchicine.  Use of a medicine that reduces uric acid levels in your blood.  Changes to your diet. You may need to see a specialist  about healthy eating (dietitian).  Follow these instructions at home: During a Gout Attack  If directed, apply ice to the affected area: ? Put ice in a plastic bag. ? Place a towel between your skin and the bag. ? Leave the ice on for 20 minutes, 2-3 times a day.  Rest the joint as much as possible. If the affected joint is in your leg, you may be given crutches to use.  Raise (elevate) the affected joint above the level of your heart as often as possible.  Drink enough fluids to keep your urine clear or pale yellow.  Take over-the-counter and prescription medicines only as told by your health care provider.  Do not drive or operate heavy machinery while taking prescription pain medicine.  Follow instructions from your health care provider about eating or drinking restrictions.  Return to your normal activities as told by your health care provider. Ask your health care provider what activities are safe for you. Avoiding Future Gout Attacks  Follow a low-purine diet as told by your dietitian or health care provider. Avoid foods and drinks that are high in purines, including liver, kidney, anchovies, asparagus, herring, mushrooms, mussels, and beer.  Limit alcohol intake to no more than 1 drink a day for nonpregnant women and 2 drinks a day for men. One drink equals 12 oz of beer, 5 oz of wine, or 1 oz of hard liquor.  Maintain a healthy weight or lose weight if you are overweight. If you want to lose weight, talk with your health care provider. It is important that you do not lose weight too quickly.  Start or maintain an exercise program as told by your health care provider.  Drink enough fluids to keep your urine clear or pale yellow.  Take over-the-counter and prescription medicines only as told by your health care provider.  Keep all follow-up visits as told by your health care provider. This is important. Contact a health care provider if:  You have another gout  attack.  You continue to have symptoms of a gout attack after10 days of treatment.  You have side effects from your medicines.  You have chills or a fever.  You have burning pain when you urinate.  You have pain in your lower back or belly. Get help right away if:  You have severe or uncontrolled pain.  You cannot urinate. This information is not intended to replace advice given to you by your health care provider. Make sure you discuss any questions you have with your health care provider. Document Released: 08/29/2000 Document Revised: 02/07/2016 Document Reviewed: 06/14/2015 Elsevier Interactive Patient Education  2017 Elsevier Inc.  

## 2017-01-05 ENCOUNTER — Encounter: Payer: Self-pay | Admitting: Internal Medicine

## 2017-01-08 ENCOUNTER — Encounter: Payer: Self-pay | Admitting: Internal Medicine

## 2017-01-08 DIAGNOSIS — B351 Tinea unguium: Secondary | ICD-10-CM | POA: Diagnosis not present

## 2017-01-08 DIAGNOSIS — L84 Corns and callosities: Secondary | ICD-10-CM | POA: Diagnosis not present

## 2017-04-14 DIAGNOSIS — H401132 Primary open-angle glaucoma, bilateral, moderate stage: Secondary | ICD-10-CM | POA: Diagnosis not present

## 2017-05-05 ENCOUNTER — Other Ambulatory Visit: Payer: Self-pay | Admitting: Internal Medicine

## 2017-05-21 ENCOUNTER — Other Ambulatory Visit: Payer: Self-pay | Admitting: Internal Medicine

## 2017-06-10 ENCOUNTER — Encounter: Payer: Self-pay | Admitting: Internal Medicine

## 2017-06-10 ENCOUNTER — Non-Acute Institutional Stay: Payer: Medicare Other | Admitting: Internal Medicine

## 2017-06-10 VITALS — BP 118/70 | HR 76 | Temp 98.4°F | Wt 174.0 lb

## 2017-06-10 DIAGNOSIS — E781 Pure hyperglyceridemia: Secondary | ICD-10-CM | POA: Diagnosis not present

## 2017-06-10 DIAGNOSIS — K219 Gastro-esophageal reflux disease without esophagitis: Secondary | ICD-10-CM | POA: Diagnosis not present

## 2017-06-10 DIAGNOSIS — S8992XA Unspecified injury of left lower leg, initial encounter: Secondary | ICD-10-CM

## 2017-06-10 DIAGNOSIS — F439 Reaction to severe stress, unspecified: Secondary | ICD-10-CM | POA: Diagnosis not present

## 2017-06-10 DIAGNOSIS — R079 Chest pain, unspecified: Secondary | ICD-10-CM | POA: Diagnosis not present

## 2017-06-10 DIAGNOSIS — R739 Hyperglycemia, unspecified: Secondary | ICD-10-CM | POA: Diagnosis not present

## 2017-06-10 NOTE — Progress Notes (Signed)
Location:  [place] of Service:  Clinic (12)  Provider: Shenea Giacobbe L. Mariea Clonts, D.O., C.M.D.  Code Status: DNR Goals of Care:  Advanced Directives 12/31/2016  Does Patient Have a Medical Advance Directive? Yes  Type of Advance Directive [place]  Does patient want to make changes to medical advance directive? -  Copy of [place] in Chart? Yes  Pre-existing out of facility DNR order (yellow form or pink MOST form) -   Chief Complaint  Patient presents with  . Acute Visit    pain in left knee, pain in left shoulder    HPI: Patient is a 76 y.o. male seen today for an acute visit for pain in left pectoral muscle when he had a violent sneeze the first time--happened about a month ago.  It hurt more the next day so he thought it felt like a muscle.  He will ache there when he's about to belch.  It will build up a pressure.  Says it happened 25 years ago and he had a cardiac cath and at that time, workup was negative except to suggest GI etiology.  Medium sharp pain across pec last thurs lasting a minute each time, felt pulsating, happened three times--am, mid-day and late afternoon.  Then he started reading about silent heart attacks and decided to move up his appt.  Still aches.  The time 25 yrs ago was when he moved his son into his dormitory.  Feels better if he pushes on the area.  No meds or interventions.  No shortness of breath or erratic heart beats.  Also had a couple of dizziness spells that went away.  He has a lot of stress--two plays to direct here and [place].  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  No changes with his indigestion.  Takes the omeprazole which makes it possible for him to live with periodic indigestion (allows naps and lying down at night).  Still belches and has to avoid certain foods.  He also has had some rotator cuff discomfort in the shoulders in the past, but that improved with exercise in the gym.  Pain left knee:   Mliss Sax checked it after the injury and said no fracture.  He was coming down the steps in a strange outdoor patio--he was carrying something and didn't see the last step, he fell intos someone else, but fell into a brick wall.  It happened 10 wks ago.  It only hurts when he bends the knee and it's over the top of the patella. Swollen at first. He used ice.    Past Medical History:  Diagnosis Date  . Allergy    seasonal  . Arthritis    knees  . GERD (gastroesophageal reflux disease)   . Glaucoma   . Hemorrhoids   . Hyperlipidemia   . RBBB     Past Surgical History:  Procedure Laterality Date  . CARDIAC CATHETERIZATION  1993  . COLONOSCOPY  2002; 2007  . POLYPECTOMY      Allergies  Allergen Reactions  . Black Pepper [[place]] Other (See Comments)    bloating  . Ciprocinonide [Fluocinolone]     bleeding  . Nsaids     History of bleeding    Outpatient Encounter Prescriptions as of 06/10/2017  Medication Sig  . Ascorbic Acid (VITAMIN C) 1000 MG tablet Take 1,000 mg by mouth daily as needed (onset of cold symptoms). Reported on 01/02/2016  . b complex vitamins tablet Take  1 tablet by mouth daily.  . bimatoprost (LUMIGAN) 0.01 % SOLN Place 1 drop into both eyes at bedtime.   . cetirizine (ZYRTEC) 10 MG tablet Take 10 mg by mouth 2 (two) times daily as needed.   . Cholecalciferol (VITAMIN D) 2000 UNITS CAPS Take 1 capsule (2,000 Units total) by mouth daily.  . folic acid (FOLVITE) 536 MCG tablet Take 800 mcg by mouth daily.   . Glucosamine-Chondroit-Vit C-Mn (GLUCOSAMINE CHONDR 1500 COMPLX PO) Take 1,500 mg by mouth daily.   Marland Kitchen omeprazole (PRILOSEC) 40 MG capsule TAKE ONE CAPSULE BY MOUTH DAILY  . pravastatin (PRAVACHOL) 40 MG tablet TAKE 1 TABLET BY MOUTH ONCE DAILY TO CONTROL CHOLESTEROL  . timolol (TIMOPTIC) 0.5 % ophthalmic solution One drop in both eyes once a day in morning for glaucoma  . [DISCONTINUED] PSEUDOEPHEDRINE HCL PO Take by mouth as needed. Reported on 01/02/2016    No facility-administered encounter medications on file as of 06/10/2017.     Review of Systems:  Review of Systems  Constitutional: Negative for chills, fever and malaise/fatigue.  HENT: Negative for congestion.   Eyes:       Glasses  Respiratory: Negative for cough and shortness of breath.   Cardiovascular: Positive for chest pain. Negative for palpitations, orthopnea, claudication, leg swelling and PND.  Gastrointestinal: Negative for abdominal pain.  Genitourinary: Negative for dysuria.  Musculoskeletal: Positive for falls, joint pain and myalgias.  Skin: Negative for itching and rash.  Neurological: Positive for dizziness. Negative for loss of consciousness and weakness.  Psychiatric/Behavioral: Negative for depression and memory loss. The patient is nervous/anxious. The patient does not have insomnia.     Health Maintenance  Topic Date Due  . INFLUENZA VACCINE  04/15/2017  . COLONOSCOPY  01/23/2019  . TETANUS/TDAP  12/09/2023  . PNA vac Low Risk Adult  Completed    Physical Exam: Vitals:   06/10/17 0931  BP: 118/70  Pulse: 76  Temp: 98.4 F (36.9 C)  TempSrc: Oral  SpO2: 94%  Weight: 174 lb (78.9 kg)   Body mass index is 26.46 kg/m. Physical Exam  Constitutional: He is oriented to person, place, and time. He appears well-developed and well-nourished. No distress.  Cardiovascular: Normal rate, regular rhythm, normal heart sounds and intact distal pulses.   Pulmonary/Chest: Effort normal and breath sounds normal. No respiratory distress.  Abdominal: Soft. Bowel sounds are normal. He exhibits no distension. There is no tenderness.  Musculoskeletal: Normal range of motion. He exhibits tenderness.  Left upper pectoralis muscle tender--feels it when shoulder is moved--not really tender, but knows it's different than the right, also mild tenderness over medial superior patella region, but no effusion, erythema, warmth of left knee  Neurological: He is alert and  oriented to person, place, and time.  Skin: Skin is warm and dry. Capillary refill takes less than 2 seconds.    Labs reviewed: Basic Metabolic Panel:  Recent Labs  06/17/16 0700  NA 140  K 4.0  BUN 9  CREATININE 1.0   Liver Function Tests: No results for input(s): AST, ALT, ALKPHOS, BILITOT, PROT, ALBUMIN in the last 8760 hours. No results for input(s): LIPASE, AMYLASE in the last 8760 hours. No results for input(s): AMMONIA in the last 8760 hours. CBC: No results for input(s): WBC, NEUTROABS, HGB, HCT, MCV, PLT in the last 8760 hours. Lipid Panel:  Recent Labs  06/17/16 0700 12/23/16 0300  CHOL 179 180  HDL 45 45  LDLCALC 96 97  TRIG 188* 194*   Lab  Results  Component Value Date   HGBA1C 6.5 12/23/2016    Assessment/Plan 1. Chest pain, unspecified type -suspect musculoskeletal, but given he's right at the borderline for diabetes, age, risk factors, check EKG to r/o cardiac etiology - EKG 12-Lead--today was unchanged from April of this year -advised to do some stretching and weight exercises to prevent recurrence of musculoskeletal injuries with minimal activity (like sneezing)  2. Gastroesophageal reflux disease, esophagitis presence not specified -has historically contributed to chest pain many years ago, but that discomfort is epigastric  3. Injury of left knee, initial encounter -suspect cartilage injury when his knee hit the brick wall, he is recovering well  4. Hyperglycemia -has f/u labs in a few weeks  5. Hypertriglyceridemia -keep f/u labs and appt, cont pravachol  6.  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 Alora Gorey, D.O. [facility] [address], [place] [address] Cell Phone (Mon-Fri 8am-5pm):  [phone] On Call:  [phone] & follow prompts after 5pm & weekends Office Phone:  [phone] Office Fax:  [phone]

## 2017-06-30 DIAGNOSIS — E119 Type 2 diabetes mellitus without complications: Secondary | ICD-10-CM | POA: Diagnosis not present

## 2017-06-30 DIAGNOSIS — Z23 Encounter for immunization: Secondary | ICD-10-CM | POA: Diagnosis not present

## 2017-06-30 DIAGNOSIS — M109 Gout, unspecified: Secondary | ICD-10-CM | POA: Diagnosis not present

## 2017-06-30 DIAGNOSIS — R739 Hyperglycemia, unspecified: Secondary | ICD-10-CM | POA: Diagnosis not present

## 2017-06-30 DIAGNOSIS — E785 Hyperlipidemia, unspecified: Secondary | ICD-10-CM | POA: Diagnosis not present

## 2017-06-30 DIAGNOSIS — E782 Mixed hyperlipidemia: Secondary | ICD-10-CM | POA: Diagnosis not present

## 2017-06-30 DIAGNOSIS — M170 Bilateral primary osteoarthritis of knee: Secondary | ICD-10-CM | POA: Diagnosis not present

## 2017-06-30 DIAGNOSIS — D649 Anemia, unspecified: Secondary | ICD-10-CM | POA: Diagnosis not present

## 2017-06-30 DIAGNOSIS — K219 Gastro-esophageal reflux disease without esophagitis: Secondary | ICD-10-CM | POA: Diagnosis not present

## 2017-06-30 LAB — HEMOGLOBIN A1C: Hemoglobin A1C: 5.4

## 2017-06-30 LAB — HEPATIC FUNCTION PANEL
ALT: 16 (ref 10–40)
AST: 21 (ref 14–40)
Alkaline Phosphatase: 74 (ref 25–125)
Bilirubin, Total: 0.5

## 2017-06-30 LAB — LIPID PANEL
Cholesterol: 168 (ref 0–200)
HDL: 47 (ref 35–70)
LDL Cholesterol: 84
Triglycerides: 182 — AB (ref 40–160)

## 2017-06-30 LAB — CBC AND DIFFERENTIAL
HCT: 45 (ref 41–53)
Hemoglobin: 14.5 (ref 13.5–17.5)
Platelets: 264 (ref 150–399)
WBC: 6.9

## 2017-06-30 LAB — BASIC METABOLIC PANEL
BUN: 11 (ref 4–21)
Creatinine: 1.1 (ref 0.6–1.3)
Glucose: 105
Potassium: 4.6 (ref 3.4–5.3)
Sodium: 141 (ref 137–147)

## 2017-07-01 ENCOUNTER — Encounter: Payer: Self-pay | Admitting: Internal Medicine

## 2017-07-08 ENCOUNTER — Encounter: Payer: Self-pay | Admitting: Internal Medicine

## 2017-07-08 ENCOUNTER — Non-Acute Institutional Stay: Payer: Medicare Other | Admitting: Internal Medicine

## 2017-07-08 VITALS — BP 148/80 | HR 62 | Temp 97.8°F | Wt 174.0 lb

## 2017-07-08 DIAGNOSIS — M1A4720 Other secondary chronic gout, left ankle and foot, without tophus (tophi): Secondary | ICD-10-CM

## 2017-07-08 DIAGNOSIS — S8992XA Unspecified injury of left lower leg, initial encounter: Secondary | ICD-10-CM

## 2017-07-08 DIAGNOSIS — R739 Hyperglycemia, unspecified: Secondary | ICD-10-CM

## 2017-07-08 DIAGNOSIS — F439 Reaction to severe stress, unspecified: Secondary | ICD-10-CM

## 2017-07-08 DIAGNOSIS — E782 Mixed hyperlipidemia: Secondary | ICD-10-CM

## 2017-07-08 DIAGNOSIS — K219 Gastro-esophageal reflux disease without esophagitis: Secondary | ICD-10-CM

## 2017-07-08 NOTE — Progress Notes (Signed)
Location:  Occupational psychologist of Service:  Clinic (12)  Provider: Saquan Furtick L. Mariea Clonts, D.O., C.M.D.  Code Status: DNR Goals of Care:  Advanced Directives 07/08/2017  Does Patient Have a Medical Advance Directive? Yes  Type of Advance Directive [place]  Does patient want to make changes to medical advance directive? -  Copy of [place] in Chart? Yes  Pre-existing out of facility DNR order (yellow form or pink MOST form) -     Chief Complaint  Patient presents with  . Medical Management of Chronic Issues    27mth follow-up    HPI: Patient is a 76 y.o. male seen today for medical management of chronic diseases.    BP elevated today upon arrival.  Blood counts normal, BMP nl, TG improved slightly from last time.  LDL down to 84 vs 97.  Liver panel normal.  hba1c 5.4.    Had only one episode of the pain in his left shoulder area since.  Started burping and felt better.  Says he needs a special valve for that.    Hba1c improved to 5.4 from 6.5.  Stopped using sweetener in tea and eats 1/2 the time sugar free desserts.   Gout symptoms improved.  Stopped eating liver and eating less bacon.    Has lost 5 lbs with eating less and better choices.    Left knee still bothersome at times but not to where he needs medications.    One project will be finished tomorrow at [employer].  Two other projects are done.  Feels like he'd otherwise be bored.  Wants to remain useful in retirement.  Coping with Pat's illness is also a challenge.  Has her back pain, too.  She's had some recent very bad days.    Sneezing more and sinuses full of gunk for 30 mins.  Then gets better.    Still on waiting list for shingrix vaccines.  Past Medical History:  Diagnosis Date  . Allergy    seasonal  . Arthritis    knees  . GERD (gastroesophageal reflux disease)   . Glaucoma   . Hemorrhoids   . Hyperlipidemia   . RBBB     Past Surgical  History:  Procedure Laterality Date  . CARDIAC CATHETERIZATION  1993  . COLONOSCOPY  2002; 2007  . POLYPECTOMY      Allergies  Allergen Reactions  . Black Pepper [[place]] Other (See Comments)    bloating  . Ciprocinonide [Fluocinolone]     bleeding  . Nsaids     History of bleeding    Outpatient Encounter Prescriptions as of 07/08/2017  Medication Sig  . Ascorbic Acid (VITAMIN C) 1000 MG tablet Take 1,000 mg by mouth daily as needed (onset of cold symptoms). Reported on 01/02/2016  . b complex vitamins tablet Take 1 tablet by mouth daily.  . bimatoprost (LUMIGAN) 0.01 % SOLN Place 1 drop into both eyes at bedtime.   . cetirizine (ZYRTEC) 10 MG tablet Take 10 mg by mouth 2 (two) times daily as needed.   . Cholecalciferol (VITAMIN D) 2000 UNITS CAPS Take 1 capsule (2,000 Units total) by mouth daily.  . folic acid (FOLVITE) 409 MCG tablet Take 800 mcg by mouth daily.   . Glucosamine-Chondroit-Vit C-Mn (GLUCOSAMINE CHONDR 1500 COMPLX PO) Take 1,500 mg by mouth daily.   Marland Kitchen omeprazole (PRILOSEC) 40 MG capsule TAKE ONE CAPSULE BY MOUTH DAILY  . pravastatin (PRAVACHOL) 40 MG tablet TAKE 1  TABLET BY MOUTH ONCE DAILY TO CONTROL CHOLESTEROL  . timolol (TIMOPTIC) 0.5 % ophthalmic solution One drop in both eyes once a day in morning for glaucoma   No facility-administered encounter medications on file as of 07/08/2017.     Review of Systems:  Review of Systems  Constitutional: Negative for chills and fever.  HENT: Negative for congestion.   Eyes: Negative for blurred vision.       Glasses  Respiratory: Negative for cough and shortness of breath.   Cardiovascular: Positive for chest pain. Negative for palpitations and leg swelling.  Gastrointestinal: Positive for heartburn. Negative for abdominal pain, blood in stool, constipation, diarrhea, melena, nausea and vomiting.  Genitourinary: Negative for dysuria.  Musculoskeletal: Positive for joint pain. Negative for falls.  Skin: Negative for  itching and rash.  Neurological: Negative for dizziness.  Psychiatric/Behavioral: Negative for depression and memory loss.    Health Maintenance  Topic Date Due  . COLONOSCOPY  01/23/2019  . TETANUS/TDAP  12/09/2023  . INFLUENZA VACCINE  Completed  . PNA vac Low Risk Adult  Completed    Physical Exam: Vitals:   07/08/17 1049  BP: (!) 148/80  Pulse: 62  Temp: 97.8 F (36.6 C)  TempSrc: Oral  SpO2: 98%  Weight: 174 lb (78.9 kg)   Body mass index is 26.46 kg/m. Physical Exam  Constitutional: He is oriented to person, place, and time. He appears well-developed and well-nourished.  Cardiovascular: Normal rate, regular rhythm, normal heart sounds and intact distal pulses.   Pulmonary/Chest: Effort normal and breath sounds normal. No respiratory distress.  Abdominal: Bowel sounds are normal.  Musculoskeletal: Normal range of motion.  Neurological: He is alert and oriented to person, place, and time.  Skin: Skin is warm and dry.  Psychiatric: He has a normal mood and affect.    Labs reviewed: Basic Metabolic Panel:  Recent Labs  06/30/17 0600  NA 141  K 4.6  BUN 11  CREATININE 1.1   Liver Function Tests:  Recent Labs  06/30/17 0600  AST 21  ALT 16  ALKPHOS 74   No results for input(s): LIPASE, AMYLASE in the last 8760 hours. No results for input(s): AMMONIA in the last 8760 hours. CBC:  Recent Labs  06/30/17 0600  WBC 6.9  HGB 14.5  HCT 45  PLT 264   Lipid Panel:  Recent Labs  12/23/16 0300 06/30/17 0600  CHOL 180 168  HDL 45 47  LDLCALC 97 84  TRIG 194* 182*   Lab Results  Component Value Date   HGBA1C 5.4 06/30/2017    Assessment/Plan 1. Gastroesophageal reflux disease, esophagitis presence not specified -recently, his biggest problem, feels he gets a lot of gas and inhales air when eating, cont prilosec and dietary modifications, conservative measures  2. Hyperglycemia -improved considerably with just a couple of dietary changes,  cont these  3. Mixed hyperlipidemia -cont pravachol and dietary changes  4. Injury of left knee, initial encounter -from prior fall when hit brick wall -doing better than he was with this, but has mild residual medial knee pain (suspect cartilage and ligamental injury)  5. 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 Emie Sommerfeld, D.O. [facility] [address], [place] [address] Cell Phone (Mon-Fri 8am-5pm):  [phone] On Call:  [phone] & follow prompts after 5pm & weekends Office Phone:  [phone] Office Fax:  [phone]

## 2017-08-02 ENCOUNTER — Other Ambulatory Visit: Payer: Self-pay | Admitting: Internal Medicine

## 2017-10-16 DIAGNOSIS — H2513 Age-related nuclear cataract, bilateral: Secondary | ICD-10-CM | POA: Diagnosis not present

## 2017-10-16 DIAGNOSIS — H52203 Unspecified astigmatism, bilateral: Secondary | ICD-10-CM | POA: Diagnosis not present

## 2017-10-16 DIAGNOSIS — H401132 Primary open-angle glaucoma, bilateral, moderate stage: Secondary | ICD-10-CM | POA: Diagnosis not present

## 2017-11-01 ENCOUNTER — Other Ambulatory Visit: Payer: Self-pay | Admitting: Internal Medicine

## 2017-11-10 ENCOUNTER — Other Ambulatory Visit: Payer: Self-pay | Admitting: Internal Medicine

## 2018-01-05 DIAGNOSIS — D649 Anemia, unspecified: Secondary | ICD-10-CM | POA: Diagnosis not present

## 2018-01-05 DIAGNOSIS — E119 Type 2 diabetes mellitus without complications: Secondary | ICD-10-CM | POA: Diagnosis not present

## 2018-01-05 DIAGNOSIS — E782 Mixed hyperlipidemia: Secondary | ICD-10-CM | POA: Diagnosis not present

## 2018-01-05 DIAGNOSIS — E785 Hyperlipidemia, unspecified: Secondary | ICD-10-CM | POA: Diagnosis not present

## 2018-01-05 DIAGNOSIS — K219 Gastro-esophageal reflux disease without esophagitis: Secondary | ICD-10-CM | POA: Diagnosis not present

## 2018-01-05 DIAGNOSIS — R739 Hyperglycemia, unspecified: Secondary | ICD-10-CM | POA: Diagnosis not present

## 2018-01-05 LAB — CBC AND DIFFERENTIAL
HCT: 42 (ref 41–53)
Hemoglobin: 13.6 (ref 13.5–17.5)
Platelets: 275 (ref 150–399)
WBC: 6.7

## 2018-01-05 LAB — LIPID PANEL
Cholesterol: 182 (ref 0–200)
HDL: 48 (ref 35–70)
LDL Cholesterol: 104
Triglycerides: 148 (ref 40–160)

## 2018-01-05 LAB — BASIC METABOLIC PANEL
BUN: 11 (ref 4–21)
Creatinine: 1.1 (ref 0.6–1.3)
Glucose: 108
Potassium: 4.3 (ref 3.4–5.3)
Sodium: 142 (ref 137–147)

## 2018-01-05 LAB — HEMOGLOBIN A1C: Hemoglobin A1C: 6.2

## 2018-01-06 ENCOUNTER — Encounter: Payer: Self-pay | Admitting: Internal Medicine

## 2018-01-12 ENCOUNTER — Non-Acute Institutional Stay: Payer: Medicare Other

## 2018-01-12 VITALS — BP 138/82 | HR 70 | Temp 98.1°F | Ht 68.0 in | Wt 176.0 lb

## 2018-01-12 DIAGNOSIS — Z0000 Encounter for general adult medical examination without abnormal findings: Secondary | ICD-10-CM | POA: Diagnosis not present

## 2018-01-12 NOTE — Patient Instructions (Signed)
Ian Rowe , Thank you for taking time to come for your Medicare Wellness Visit. I appreciate your ongoing commitment to your health goals. Please review the following plan we discussed and let me know if I can assist you in the future.   Screening recommendations/referrals: Colonoscopy up to date, due 01/23/2019 Recommended yearly ophthalmology/optometry visit for glaucoma screening and checkup Recommended yearly dental visit for hygiene and checkup  Vaccinations: Influenza vaccine up to date, due 2019 fall season Pneumococcal vaccine up to date, completed Tdap vaccine up to date, due 12/09/2023 Shingles vaccine up to date, completed    Advanced directives: in chart  Conditions/risks identified: none  Next appointment: Dr. Mariea Clonts 01/13/2018 @ 11am  Preventive Care 26 Years and Older, Male Preventive care refers to lifestyle choices and visits with your health care provider that can promote health and wellness. What does preventive care include?  A yearly physical exam. This is also called an annual well check.  Dental exams once or twice a year.  Routine eye exams. Ask your health care provider how often you should have your eyes checked.  Personal lifestyle choices, including:  Daily care of your teeth and gums.  Regular physical activity.  Eating a healthy diet.  Avoiding tobacco and drug use.  Limiting alcohol use.  Practicing safe sex.  Taking low doses of aspirin every day.  Taking vitamin and mineral supplements as recommended by your health care provider. What happens during an annual well check? The services and screenings done by your health care provider during your annual well check will depend on your age, overall health, lifestyle risk factors, and family history of disease. Counseling  Your health care provider may ask you questions about your:  Alcohol use.  Tobacco use.  Drug use.  Emotional well-being.  Home and relationship  well-being.  Sexual activity.  Eating habits.  History of falls.  Memory and ability to understand (cognition).  Work and work Statistician. Screening  You may have the following tests or measurements:  Height, weight, and BMI.  Blood pressure.  Lipid and cholesterol levels. These may be checked every 5 years, or more frequently if you are over 11 years old.  Skin check.  Lung cancer screening. You may have this screening every year starting at age 15 if you have a 30-pack-year history of smoking and currently smoke or have quit within the past 15 years.  Fecal occult blood test (FOBT) of the stool. You may have this test every year starting at age 59.  Flexible sigmoidoscopy or colonoscopy. You may have a sigmoidoscopy every 5 years or a colonoscopy every 10 years starting at age 37.  Prostate cancer screening. Recommendations will vary depending on your family history and other risks.  Hepatitis C blood test.  Hepatitis B blood test.  Sexually transmitted disease (STD) testing.  Diabetes screening. This is done by checking your blood sugar (glucose) after you have not eaten for a while (fasting). You may have this done every 1-3 years.  Abdominal aortic aneurysm (AAA) screening. You may need this if you are a current or former smoker.  Osteoporosis. You may be screened starting at age 37 if you are at high risk. Talk with your health care provider about your test results, treatment options, and if necessary, the need for more tests. Vaccines  Your health care provider may recommend certain vaccines, such as:  Influenza vaccine. This is recommended every year.  Tetanus, diphtheria, and acellular pertussis (Tdap, Td) vaccine. You may  need a Td booster every 10 years.  Zoster vaccine. You may need this after age 76.  Pneumococcal 13-valent conjugate (PCV13) vaccine. One dose is recommended after age 76.  Pneumococcal polysaccharide (PPSV23) vaccine. One dose is  recommended after age 17. Talk to your health care provider about which screenings and vaccines you need and how often you need them. This information is not intended to replace advice given to you by your health care provider. Make sure you discuss any questions you have with your health care provider. Document Released: 09/28/2015 Document Revised: 05/21/2016 Document Reviewed: 07/03/2015 Elsevier Interactive Patient Education  2017 [place] Prevention in the Home Falls can cause injuries. They can happen to people of all ages. There are many things you can do to make your home safe and to help prevent falls. What can I do on the outside of my home?  Regularly fix the edges of walkways and driveways and fix any cracks.  Remove anything that might make you trip as you walk through a door, such as a raised step or threshold.  Trim any bushes or trees on the path to your home.  Use bright outdoor lighting.  Clear any walking paths of anything that might make someone trip, such as rocks or tools.  Regularly check to see if handrails are loose or broken. Make sure that both sides of any steps have handrails.  Any raised decks and porches should have guardrails on the edges.  Have any leaves, snow, or ice cleared regularly.  Use sand or salt on walking paths during winter.  Clean up any spills in your garage right away. This includes oil or grease spills. What can I do in the bathroom?  Use night lights.  Install grab bars by the toilet and in the tub and shower. Do not use towel bars as grab bars.  Use non-skid mats or decals in the tub or shower.  If you need to sit down in the shower, use a plastic, non-slip stool.  Keep the floor dry. Clean up any water that spills on the floor as soon as it happens.  Remove soap buildup in the tub or shower regularly.  Attach bath mats securely with double-sided non-slip rug tape.  Do not have throw rugs and other things on  the floor that can make you trip. What can I do in the bedroom?  Use night lights.  Make sure that you have a light by your bed that is easy to reach.  Do not use any sheets or blankets that are too big for your bed. They should not hang down onto the floor.  Have a firm chair that has side arms. You can use this for support while you get dressed.  Do not have throw rugs and other things on the floor that can make you trip. What can I do in the kitchen?  Clean up any spills right away.  Avoid walking on wet floors.  Keep items that you use a lot in easy-to-reach places.  If you need to reach something above you, use a strong step stool that has a grab bar.  Keep electrical cords out of the way.  Do not use floor polish or wax that makes floors slippery. If you must use wax, use non-skid floor wax.  Do not have throw rugs and other things on the floor that can make you trip. What can I do with my stairs?  Do not leave any items on  the stairs.  Make sure that there are handrails on both sides of the stairs and use them. Fix handrails that are broken or loose. Make sure that handrails are as long as the stairways.  Check any carpeting to make sure that it is firmly attached to the stairs. Fix any carpet that is loose or worn.  Avoid having throw rugs at the top or bottom of the stairs. If you do have throw rugs, attach them to the floor with carpet tape.  Make sure that you have a light switch at the top of the stairs and the bottom of the stairs. If you do not have them, ask someone to add them for you. What else can I do to help prevent falls?  Wear shoes that:  Do not have high heels.  Have rubber bottoms.  Are comfortable and fit you well.  Are closed at the toe. Do not wear sandals.  If you use a stepladder:  Make sure that it is fully opened. Do not climb a closed stepladder.  Make sure that both sides of the stepladder are locked into place.  Ask someone to  hold it for you, if possible.  Clearly mark and make sure that you can see:  Any grab bars or handrails.  First and last steps.  Where the edge of each step is.  Use tools that help you move around (mobility aids) if they are needed. These include:  Canes.  Walkers.  Scooters.  Crutches.  Turn on the lights when you go into a dark area. Replace any light bulbs as soon as they burn out.  Set up your furniture so you have a clear path. Avoid moving your furniture around.  If any of your floors are uneven, fix them.  If there are any pets around you, be aware of where they are.  Review your medicines with your doctor. Some medicines can make you feel dizzy. This can increase your chance of falling. Ask your doctor what other things that you can do to help prevent falls. This information is not intended to replace advice given to you by your health care provider. Make sure you discuss any questions you have with your health care provider. Document Released: 06/28/2009 Document Revised: 02/07/2016 Document Reviewed: 10/06/2014 Elsevier Interactive Patient Education  2017 [employer].

## 2018-01-12 NOTE — Progress Notes (Signed)
Subjective:   Ian Rowe is a 77 y.o. male who presents for Medicare Annual/Subsequent preventive examination at [facility]  Last AWV-12/31/2016    Objective:    Vitals: BP 138/82 (BP Location: Right Arm, Patient Position: Sitting)   Pulse 70   Temp 98.1 F (36.7 C) (Oral)   Ht 5\' 8"  (1.727 m)   Wt 176 lb (79.8 kg)   SpO2 98%   BMI 26.76 kg/m   Body mass index is 26.76 kg/m.  Advanced Directives 01/12/2018 07/08/2017 12/31/2016 06/25/2016 01/02/2016 12/26/2015 06/27/2015  Does Patient Have a Medical Advance Directive? - Yes Yes Yes Yes Yes -  Type of [place] administrator Power of [place] of [place];Living will [place] -  Does patient want to make changes to medical advance directive? No - Patient declined - - No - Patient declined - - No - Patient declined  Copy of [place] in Chart? Yes Yes Yes Yes - Yes Yes  Pre-existing out of facility DNR order (yellow form or pink MOST form) - - - - - - -    Tobacco Social History   Tobacco Use  Smoking Status Never Smoker  Smokeless Tobacco Never Used     Counseling given: Not Answered   Clinical Intake:  Pre-visit preparation completed: No  Pain : No/denies pain     Nutritional Risks: None Diabetes: No  How often do you need to have someone help you when you read instructions, pamphlets, or other written materials from your doctor or pharmacy?: 1 - Never What is the last grade level you completed in school?: Doctorate  Interpreter Needed?: No  Information entered by :: Tyson Dense, RN  Past Medical History:  Diagnosis Date  . Allergy    seasonal  . Arthritis    knees  . GERD (gastroesophageal reflux disease)   . Glaucoma   . Hemorrhoids   . Hyperlipidemia   . RBBB    Past Surgical History:  Procedure Laterality Date  . CARDIAC  CATHETERIZATION  1993  . COLONOSCOPY  2002; 2007  . POLYPECTOMY     Family History  Problem Relation Age of Onset  . Alzheimer's disease Mother   . Heart disease Father   . Colon cancer Neg Hx   . Colon polyps Neg Hx    Social History   Socioeconomic History  . Marital status: Married    Spouse name: Mardene Celeste  . Number of children: 2  . Years of education: Not on file  . Highest education level: Not on file  Occupational History  . Occupation: Retired Clinical biochemist  Social Needs  . Financial resource strain: Not hard at all  . Food insecurity:    Worry: Never true    Inability: Never true  . Transportation needs:    Medical: No    Non-medical: No  Tobacco Use  . Smoking status: Never Smoker  . Smokeless tobacco: Never Used  Substance and Sexual Activity  . Alcohol use: Yes    Alcohol/week: 6.0 oz    Types: 7 Shots of liquor, 3 Glasses of wine per week    Comment: 2 scotch at bedtime nightly, occ glass of wine   . Drug use: No  . Sexual activity: Not on file  Lifestyle  . Physical activity:    Days per week: 5 days    Minutes per session: 30 min  .  Stress: Only a little  Relationships  . Social connections:    Talks on phone: More than three times a week    Gets together: More than three times a week    Attends religious service: Never    Active member of club or organization: Yes    Attends meetings of clubs or organizations: More than 4 times per year    Relationship status: Married  Other Topics Concern  . Not on file  Social History Narrative   Moved to [facility] 2014.   Married   Never smoked   Alcohol - scotch or wine nightly   Walks 150 minutes per week.    POA    Outpatient Encounter Medications as of 01/12/2018  Medication Sig  . Ascorbic Acid (VITAMIN C) 1000 MG tablet Take 1,000 mg by mouth daily as needed (onset of cold symptoms). Reported on 01/02/2016  . b complex vitamins tablet Take 1 tablet by mouth daily.  .  bimatoprost (LUMIGAN) 0.01 % SOLN Place 1 drop into both eyes at bedtime.   . cetirizine (ZYRTEC) 10 MG tablet Take 10 mg by mouth 2 (two) times daily as needed.   . Cholecalciferol (VITAMIN D) 2000 UNITS CAPS Take 1 capsule (2,000 Units total) by mouth daily.  . folic acid (FOLVITE) 637 MCG tablet Take 800 mcg by mouth daily.   . Glucosamine-Chondroit-Vit C-Mn (GLUCOSAMINE CHONDR 1500 COMPLX PO) Take 1,500 mg by mouth daily.   Marland Kitchen omeprazole (PRILOSEC) 40 MG capsule TAKE ONE CAPSULE BY MOUTH DAILY  . pravastatin (PRAVACHOL) 40 MG tablet TAKE 1 TABLET BY MOUTH ONCE DAILY TO CONTROL CHOLESTEROL  . timolol (TIMOPTIC) 0.5 % ophthalmic solution One drop in both eyes once a day in morning for glaucoma   No facility-administered encounter medications on file as of 01/12/2018.     Activities of Daily Living In your present state of health, do you have any difficulty performing the following activities: 01/12/2018  Hearing? N  Vision? N  Difficulty concentrating or making decisions? Y  Walking or climbing stairs? Y  Dressing or bathing? N  Doing errands, shopping? N  Preparing Food and eating ? N  Using the Toilet? N  In the past six months, have you accidently leaked urine? N  Do you have problems with loss of bowel control? N  Managing your Medications? N  Managing your Finances? N  Some recent data might be hidden    Patient Care Team: Gayland Curry, DO as PCP - General (Geriatric Medicine) Community[facility]   Assessment:   This is a routine wellness examination for Ian Rowe.  Exercise Activities and Dietary recommendations Current Exercise Habits: Home exercise routine, Type of exercise: walking;strength training/weights, Time (Minutes): 30, Frequency (Times/Week): 5, Weekly Exercise (Minutes/Week): 150, Intensity: Mild, Exercise limited by: None identified  Goals    None      Fall Risk Fall Risk  01/12/2018 07/08/2017 06/10/2017 12/31/2016 06/25/2016  Falls in the  past year? No No Yes No No  Number falls in past yr: - - 1 - -   Is the patient's home free of loose throw rugs in walkways, pet beds, electrical cords, etc?   yes      Grab bars in the bathroom? yes      Handrails on the stairs?   yes      Adequate lighting?   yes   Depression Screen PHQ 2/9 Scores 01/12/2018 07/08/2017 06/10/2017 12/31/2016  PHQ - 2 Score 0 0 0 0  Cognitive Function MMSE - Mini Mental State Exam 01/12/2018 12/31/2016 06/27/2015  Orientation to time 5 5 5   Orientation to Place 5 5 5   Registration 3 3 3   Attention/ Calculation 5 5 5   Recall 3 3 3   Language- name 2 objects 2 2 2   Language- repeat 1 1 1   Language- follow 3 step command 3 3 3   Language- read & follow direction 1 1 1   Write a sentence 1 1 1   Copy design 1 1 1   Total score 30 30 30         Immunization History  Administered Date(s) Administered  . Influenza Whole 06/15/2012, 06/15/2013  . Influenza, High Dose Seasonal PF 06/19/2016  . Influenza-Unspecified 06/14/2014, 06/21/2015, 06/30/2017  . Pneumococcal Conjugate-13 10/31/2014  . Pneumococcal Polysaccharide-23 09/16/2011  . Tdap 12/08/2013  . Zoster Recombinat (Shingrix) 08/29/2017, 11/03/2017    Qualifies for Shingles Vaccine? No, up to date  Screening Tests Health Maintenance  Topic Date Due  . INFLUENZA VACCINE  04/15/2018  . COLONOSCOPY  01/23/2019  . TETANUS/TDAP  12/09/2023  . PNA vac Low Risk Adult  Completed   Cancer Screenings: Lung: Low Dose CT Chest recommended if Age 32-80 years, 30 pack-year currently smoking OR have quit w/in 15years. Patient does not qualify. Colorectal: up to date  Additional Screenings:  Hepatitis C Screening:declined      Plan:  I have personally reviewed and addressed the Medicare Annual Wellness questionnaire and have noted the following in the patient's chart:  A. Medical and social history B. Use of alcohol, tobacco or illicit drugs  C. Current medications and supplements D. Functional  ability and status E.  Nutritional status F.  Physical activity G. Advance directives H. List of other physicians I.  Hospitalizations, surgeries, and ER visits in previous 12 months J.  [place] to include hearing, vision, cognitive, depression L. Referrals and appointments - none  In addition, I have reviewed and discussed with patient certain preventive protocols, quality metrics, and best practice recommendations. A written personalized care plan for preventive services as well as general preventive health recommendations were provided to patient.  See attached scanned questionnaire for additional information.   Signed,   Tyson Dense, RN Nurse Health Advisor  Patient Concerns: None

## 2018-01-13 ENCOUNTER — Non-Acute Institutional Stay: Payer: Medicare Other | Admitting: Internal Medicine

## 2018-01-13 ENCOUNTER — Encounter: Payer: Self-pay | Admitting: Internal Medicine

## 2018-01-13 VITALS — BP 138/80 | HR 64 | Temp 98.6°F | Ht 68.0 in | Wt 175.0 lb

## 2018-01-13 DIAGNOSIS — M1A0720 Idiopathic chronic gout, left ankle and foot, without tophus (tophi): Secondary | ICD-10-CM | POA: Diagnosis not present

## 2018-01-13 DIAGNOSIS — K219 Gastro-esophageal reflux disease without esophagitis: Secondary | ICD-10-CM

## 2018-01-13 DIAGNOSIS — H409 Unspecified glaucoma: Secondary | ICD-10-CM | POA: Diagnosis not present

## 2018-01-13 DIAGNOSIS — R739 Hyperglycemia, unspecified: Secondary | ICD-10-CM

## 2018-01-13 DIAGNOSIS — M7540 Impingement syndrome of unspecified shoulder: Secondary | ICD-10-CM

## 2018-01-13 DIAGNOSIS — E782 Mixed hyperlipidemia: Secondary | ICD-10-CM | POA: Diagnosis not present

## 2018-01-13 MED ORDER — ROSUVASTATIN CALCIUM 5 MG PO TABS: 5.0000 mg | ORAL_TABLET | Freq: Every day | ORAL | 3 refills | Status: DC

## 2018-01-13 NOTE — Progress Notes (Signed)
Location:  Occupational psychologist of Service:  Clinic (12)  Provider: [place] Maiolo L. Mariea Clonts, D.O., C.M.D.  Code Status: DNR Goals of Care:  Advanced Directives 01/13/2018  Does Patient Have a Medical Advance Directive? Yes  Type of Advance Directive [place]  Does patient want to make changes to medical advance directive? No - Patient declined  Copy of [place] in Chart? Yes  Pre-existing out of facility DNR order (yellow form or pink MOST form) -   Chief Complaint  Patient presents with  . Medical Management of Chronic Issues    53mth follow-up    HPI: Patient is a 77 y.o. male seen today for medical management of chronic diseases.    He feels pretty good.  No major changes.    Gout very mild.  If he eats a lamb chop, he might know it.    He is trying to eat more sugar free desserts, but does eat them.  Stopped putting honey in tea.  1-2 scotches at hs have not changed.  Occasional wine with his wife.    He has upped his minimal walking from 150-200 per week.  Did start some upper body strength exercises 2-3 times per week.    Doing fine with his eyes.  Has cataracts that eventually will need intervention--night vision is worsening.  Sess Dr. Ellie Lunch.  Glaucoma pressures have been under control.  Using his two drops as directed.    Past Medical History:  Diagnosis Date  . Allergy    seasonal  . Arthritis    knees  . GERD (gastroesophageal reflux disease)   . Glaucoma   . Hemorrhoids   . Hyperlipidemia   . RBBB     Past Surgical History:  Procedure Laterality Date  . CARDIAC CATHETERIZATION  1993  . COLONOSCOPY  2002; 2007  . POLYPECTOMY      Allergies  Allergen Reactions  . Black Pepper [[place]per] Other (See Comments)    bloating  . Ciprocinonide [Fluocinolone]     bleeding  . Nsaids     History of bleeding    Outpatient Encounter Medications as of 01/13/2018  Medication Sig  . Ascorbic Acid (VITAMIN C)  1000 MG tablet Take 1,000 mg by mouth daily as needed (onset of cold symptoms). Reported on 01/02/2016  . b complex vitamins tablet Take 1 tablet by mouth daily.  . bimatoprost (LUMIGAN) 0.01 % SOLN Place 1 drop into both eyes at bedtime.   . cetirizine (ZYRTEC) 10 MG tablet Take 10 mg by mouth 2 (two) times daily as needed.   . Cholecalciferol (VITAMIN D) 2000 UNITS CAPS Take 1 capsule (2,000 Units total) by mouth daily.  . folic acid (FOLVITE) 270 MCG tablet Take 800 mcg by mouth daily.   . Glucosamine-Chondroit-Vit C-Mn (GLUCOSAMINE CHONDR 1500 COMPLX PO) Take 1,500 mg by mouth daily.   Marland Kitchen omeprazole (PRILOSEC) 40 MG capsule TAKE ONE CAPSULE BY MOUTH DAILY  . pravastatin (PRAVACHOL) 40 MG tablet TAKE 1 TABLET BY MOUTH ONCE DAILY TO CONTROL CHOLESTEROL  . timolol (TIMOPTIC) 0.5 % ophthalmic solution One drop in both eyes once a day in morning for glaucoma   No facility-administered encounter medications on file as of 01/13/2018.     Review of Systems:  Review of Systems  Constitutional: Negative for chills, fever and malaise/fatigue.  HENT: Negative for congestion.   Eyes: Negative for blurred vision.       Glasses, glaucoma  Respiratory: Negative for  shortness of breath.   Cardiovascular: Negative for chest pain, palpitations and leg swelling.  Gastrointestinal: Negative for abdominal pain, blood in stool, constipation and melena.  Genitourinary: Negative for dysuria.  Musculoskeletal: Positive for joint pain. Negative for falls.       H/o left shoulder impingement  Skin: Negative for itching and rash.  Neurological: Negative for dizziness and loss of consciousness.  Endo/Heme/Allergies: Does not bruise/bleed easily.  Psychiatric/Behavioral: Negative for depression and memory loss. The patient is not nervous/anxious and does not have insomnia.     Health Maintenance  Topic Date Due  . INFLUENZA VACCINE  04/15/2018  . COLONOSCOPY  01/23/2019  . TETANUS/TDAP  12/09/2023  . PNA vac  Low Risk Adult  Completed    Physical Exam: Vitals:   01/13/18 1052  BP: 138/80  Pulse: 64  Temp: 98.6 F (37 C)  TempSrc: Oral  SpO2: 98%  Weight: 175 lb (79.4 kg)  Height: 5\' 8"  (1.727 m)   Body mass index is 26.61 kg/m. Physical Exam  Constitutional: He is oriented to person, place, and time. He appears well-developed and well-nourished. No distress.  Eyes:  glasses  Cardiovascular: Normal rate, regular rhythm, normal heart sounds and intact distal pulses.  Pulmonary/Chest: Effort normal and breath sounds normal. No respiratory distress.  Abdominal: Bowel sounds are normal.  Musculoskeletal: Normal range of motion.  Neurological: He is alert and oriented to person, place, and time.  Skin: Skin is warm and dry.  Psychiatric: He has a normal mood and affect.    Labs reviewed: Basic Metabolic Panel: Recent Labs    06/30/17 01/05/18 0600  NA 141 142  K 4.6 4.3  BUN 11 11  CREATININE 1.1 1.1   Liver Function Tests: Recent Labs    06/30/17 0600  AST 21  ALT 16  ALKPHOS 74   No results for input(s): LIPASE, AMYLASE in the last 8760 hours. No results for input(s): AMMONIA in the last 8760 hours. CBC: Recent Labs    06/30/17 01/05/18 0600  WBC 6.9 6.7  HGB 14.5 13.6  HCT 45 42  PLT 264 275   Lipid Panel: Recent Labs    06/30/17 01/05/18 0600  CHOL 168 182  HDL 47 48  LDLCALC 84 104  TRIG 182* 148   Lab Results  Component Value Date   HGBA1C 6.2 01/05/2018    Assessment/Plan 1. Mixed hyperlipidemia - based on guidelines, he should be on a high potency statin rather than pravachol (he reports that back when Dr. Nyoka Cowden was here, he had a genetic test that suggested his body would better tolerate the pravachol--I looked for the test in his records and do not see it to look at) -he is interested in switching to see if we can lower his cholesterol -he is trying to make healthier choices with fewer sweets and less etoh - rosuvastatin (CRESTOR) 5 MG  tablet; Take 1 tablet (5 mg total) by mouth daily.  Dispense: 30 tablet; Refill: 3  2. Hyperglycemia -this is more concerning at this point than his cholesterol values due to the upward trend being more pronounced -he will continue with his walking, weights and cutting down on sweets and alcohol--he thinks the alcohol is a bigger culprit  3. Gastroesophageal reflux disease, esophagitis presence not specified -well-controlled with prilosec therapy   4. Rotator cuff impingement syndrome, unspecified laterality -left in the past -cont weight training to maintain mobility  5. Mild stage glaucoma -cont to follow with Dr. 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 Dehlia Kilner, D.O. [facility] [address], [place] [address] Cell Phone (Mon-Fri 8am-5pm):  [phone] On Call:  [phone] & follow prompts after 5pm & weekends Office Phone:  [phone] Office Fax:  [phone]

## 2018-04-19 DIAGNOSIS — H2513 Age-related nuclear cataract, bilateral: Secondary | ICD-10-CM | POA: Diagnosis not present

## 2018-04-19 DIAGNOSIS — H401132 Primary open-angle glaucoma, bilateral, moderate stage: Secondary | ICD-10-CM | POA: Diagnosis not present

## 2018-05-07 ENCOUNTER — Encounter: Payer: Self-pay | Admitting: Internal Medicine

## 2018-05-25 ENCOUNTER — Other Ambulatory Visit: Payer: Self-pay | Admitting: Internal Medicine

## 2018-05-28 DIAGNOSIS — M25511 Pain in right shoulder: Secondary | ICD-10-CM | POA: Diagnosis not present

## 2018-05-28 DIAGNOSIS — M63822 Disorders of muscle in diseases classified elsewhere, left upper arm: Secondary | ICD-10-CM | POA: Diagnosis not present

## 2018-05-28 DIAGNOSIS — M25512 Pain in left shoulder: Secondary | ICD-10-CM | POA: Diagnosis not present

## 2018-05-28 DIAGNOSIS — M7541 Impingement syndrome of right shoulder: Secondary | ICD-10-CM | POA: Diagnosis not present

## 2018-05-28 DIAGNOSIS — M63821 Disorders of muscle in diseases classified elsewhere, right upper arm: Secondary | ICD-10-CM | POA: Diagnosis not present

## 2018-05-28 DIAGNOSIS — M4046 Postural lordosis, lumbar region: Secondary | ICD-10-CM | POA: Diagnosis not present

## 2018-05-28 DIAGNOSIS — M7542 Impingement syndrome of left shoulder: Secondary | ICD-10-CM | POA: Diagnosis not present

## 2018-05-31 ENCOUNTER — Other Ambulatory Visit: Payer: Self-pay | Admitting: Internal Medicine

## 2018-05-31 DIAGNOSIS — M7541 Impingement syndrome of right shoulder: Secondary | ICD-10-CM | POA: Diagnosis not present

## 2018-05-31 DIAGNOSIS — M63821 Disorders of muscle in diseases classified elsewhere, right upper arm: Secondary | ICD-10-CM | POA: Diagnosis not present

## 2018-05-31 DIAGNOSIS — M63822 Disorders of muscle in diseases classified elsewhere, left upper arm: Secondary | ICD-10-CM | POA: Diagnosis not present

## 2018-05-31 DIAGNOSIS — M4046 Postural lordosis, lumbar region: Secondary | ICD-10-CM | POA: Diagnosis not present

## 2018-05-31 DIAGNOSIS — M25511 Pain in right shoulder: Secondary | ICD-10-CM | POA: Diagnosis not present

## 2018-05-31 DIAGNOSIS — E782 Mixed hyperlipidemia: Secondary | ICD-10-CM

## 2018-05-31 DIAGNOSIS — M25512 Pain in left shoulder: Secondary | ICD-10-CM | POA: Diagnosis not present

## 2018-06-02 DIAGNOSIS — M25512 Pain in left shoulder: Secondary | ICD-10-CM | POA: Diagnosis not present

## 2018-06-02 DIAGNOSIS — M25511 Pain in right shoulder: Secondary | ICD-10-CM | POA: Diagnosis not present

## 2018-06-02 DIAGNOSIS — M63822 Disorders of muscle in diseases classified elsewhere, left upper arm: Secondary | ICD-10-CM | POA: Diagnosis not present

## 2018-06-02 DIAGNOSIS — M7541 Impingement syndrome of right shoulder: Secondary | ICD-10-CM | POA: Diagnosis not present

## 2018-06-02 DIAGNOSIS — M63821 Disorders of muscle in diseases classified elsewhere, right upper arm: Secondary | ICD-10-CM | POA: Diagnosis not present

## 2018-06-02 DIAGNOSIS — M4046 Postural lordosis, lumbar region: Secondary | ICD-10-CM | POA: Diagnosis not present

## 2018-06-07 DIAGNOSIS — M63822 Disorders of muscle in diseases classified elsewhere, left upper arm: Secondary | ICD-10-CM | POA: Diagnosis not present

## 2018-06-07 DIAGNOSIS — M7541 Impingement syndrome of right shoulder: Secondary | ICD-10-CM | POA: Diagnosis not present

## 2018-06-07 DIAGNOSIS — M25512 Pain in left shoulder: Secondary | ICD-10-CM | POA: Diagnosis not present

## 2018-06-07 DIAGNOSIS — M4046 Postural lordosis, lumbar region: Secondary | ICD-10-CM | POA: Diagnosis not present

## 2018-06-07 DIAGNOSIS — M25511 Pain in right shoulder: Secondary | ICD-10-CM | POA: Diagnosis not present

## 2018-06-07 DIAGNOSIS — M63821 Disorders of muscle in diseases classified elsewhere, right upper arm: Secondary | ICD-10-CM | POA: Diagnosis not present

## 2018-06-09 DIAGNOSIS — M25511 Pain in right shoulder: Secondary | ICD-10-CM | POA: Diagnosis not present

## 2018-06-09 DIAGNOSIS — M7541 Impingement syndrome of right shoulder: Secondary | ICD-10-CM | POA: Diagnosis not present

## 2018-06-09 DIAGNOSIS — M63821 Disorders of muscle in diseases classified elsewhere, right upper arm: Secondary | ICD-10-CM | POA: Diagnosis not present

## 2018-06-09 DIAGNOSIS — M25512 Pain in left shoulder: Secondary | ICD-10-CM | POA: Diagnosis not present

## 2018-06-09 DIAGNOSIS — M63822 Disorders of muscle in diseases classified elsewhere, left upper arm: Secondary | ICD-10-CM | POA: Diagnosis not present

## 2018-06-09 DIAGNOSIS — M4046 Postural lordosis, lumbar region: Secondary | ICD-10-CM | POA: Diagnosis not present

## 2018-06-14 DIAGNOSIS — M25511 Pain in right shoulder: Secondary | ICD-10-CM | POA: Diagnosis not present

## 2018-06-14 DIAGNOSIS — M7541 Impingement syndrome of right shoulder: Secondary | ICD-10-CM | POA: Diagnosis not present

## 2018-06-14 DIAGNOSIS — M25512 Pain in left shoulder: Secondary | ICD-10-CM | POA: Diagnosis not present

## 2018-06-14 DIAGNOSIS — M4046 Postural lordosis, lumbar region: Secondary | ICD-10-CM | POA: Diagnosis not present

## 2018-06-14 DIAGNOSIS — M63821 Disorders of muscle in diseases classified elsewhere, right upper arm: Secondary | ICD-10-CM | POA: Diagnosis not present

## 2018-06-14 DIAGNOSIS — M63822 Disorders of muscle in diseases classified elsewhere, left upper arm: Secondary | ICD-10-CM | POA: Diagnosis not present

## 2018-06-18 DIAGNOSIS — M7542 Impingement syndrome of left shoulder: Secondary | ICD-10-CM | POA: Diagnosis not present

## 2018-06-18 DIAGNOSIS — M4046 Postural lordosis, lumbar region: Secondary | ICD-10-CM | POA: Diagnosis not present

## 2018-06-18 DIAGNOSIS — M63821 Disorders of muscle in diseases classified elsewhere, right upper arm: Secondary | ICD-10-CM | POA: Diagnosis not present

## 2018-06-18 DIAGNOSIS — M7541 Impingement syndrome of right shoulder: Secondary | ICD-10-CM | POA: Diagnosis not present

## 2018-06-18 DIAGNOSIS — M25511 Pain in right shoulder: Secondary | ICD-10-CM | POA: Diagnosis not present

## 2018-06-18 DIAGNOSIS — M25512 Pain in left shoulder: Secondary | ICD-10-CM | POA: Diagnosis not present

## 2018-06-18 DIAGNOSIS — M63822 Disorders of muscle in diseases classified elsewhere, left upper arm: Secondary | ICD-10-CM | POA: Diagnosis not present

## 2018-06-21 DIAGNOSIS — M4046 Postural lordosis, lumbar region: Secondary | ICD-10-CM | POA: Diagnosis not present

## 2018-06-21 DIAGNOSIS — M63822 Disorders of muscle in diseases classified elsewhere, left upper arm: Secondary | ICD-10-CM | POA: Diagnosis not present

## 2018-06-21 DIAGNOSIS — M7541 Impingement syndrome of right shoulder: Secondary | ICD-10-CM | POA: Diagnosis not present

## 2018-06-21 DIAGNOSIS — M25511 Pain in right shoulder: Secondary | ICD-10-CM | POA: Diagnosis not present

## 2018-06-21 DIAGNOSIS — M25512 Pain in left shoulder: Secondary | ICD-10-CM | POA: Diagnosis not present

## 2018-06-21 DIAGNOSIS — M63821 Disorders of muscle in diseases classified elsewhere, right upper arm: Secondary | ICD-10-CM | POA: Diagnosis not present

## 2018-06-22 DIAGNOSIS — Z23 Encounter for immunization: Secondary | ICD-10-CM | POA: Diagnosis not present

## 2018-06-23 DIAGNOSIS — M63822 Disorders of muscle in diseases classified elsewhere, left upper arm: Secondary | ICD-10-CM | POA: Diagnosis not present

## 2018-06-23 DIAGNOSIS — M7541 Impingement syndrome of right shoulder: Secondary | ICD-10-CM | POA: Diagnosis not present

## 2018-06-23 DIAGNOSIS — M63821 Disorders of muscle in diseases classified elsewhere, right upper arm: Secondary | ICD-10-CM | POA: Diagnosis not present

## 2018-06-23 DIAGNOSIS — M25511 Pain in right shoulder: Secondary | ICD-10-CM | POA: Diagnosis not present

## 2018-06-23 DIAGNOSIS — M4046 Postural lordosis, lumbar region: Secondary | ICD-10-CM | POA: Diagnosis not present

## 2018-06-23 DIAGNOSIS — M25512 Pain in left shoulder: Secondary | ICD-10-CM | POA: Diagnosis not present

## 2018-06-29 DIAGNOSIS — M4046 Postural lordosis, lumbar region: Secondary | ICD-10-CM | POA: Diagnosis not present

## 2018-06-29 DIAGNOSIS — M25512 Pain in left shoulder: Secondary | ICD-10-CM | POA: Diagnosis not present

## 2018-06-29 DIAGNOSIS — M7541 Impingement syndrome of right shoulder: Secondary | ICD-10-CM | POA: Diagnosis not present

## 2018-06-29 DIAGNOSIS — M63822 Disorders of muscle in diseases classified elsewhere, left upper arm: Secondary | ICD-10-CM | POA: Diagnosis not present

## 2018-06-29 DIAGNOSIS — M25511 Pain in right shoulder: Secondary | ICD-10-CM | POA: Diagnosis not present

## 2018-06-29 DIAGNOSIS — M63821 Disorders of muscle in diseases classified elsewhere, right upper arm: Secondary | ICD-10-CM | POA: Diagnosis not present

## 2018-07-02 DIAGNOSIS — M7541 Impingement syndrome of right shoulder: Secondary | ICD-10-CM | POA: Diagnosis not present

## 2018-07-02 DIAGNOSIS — M63821 Disorders of muscle in diseases classified elsewhere, right upper arm: Secondary | ICD-10-CM | POA: Diagnosis not present

## 2018-07-02 DIAGNOSIS — M4046 Postural lordosis, lumbar region: Secondary | ICD-10-CM | POA: Diagnosis not present

## 2018-07-02 DIAGNOSIS — M25511 Pain in right shoulder: Secondary | ICD-10-CM | POA: Diagnosis not present

## 2018-07-02 DIAGNOSIS — M25512 Pain in left shoulder: Secondary | ICD-10-CM | POA: Diagnosis not present

## 2018-07-02 DIAGNOSIS — M63822 Disorders of muscle in diseases classified elsewhere, left upper arm: Secondary | ICD-10-CM | POA: Diagnosis not present

## 2018-07-05 DIAGNOSIS — M25511 Pain in right shoulder: Secondary | ICD-10-CM | POA: Diagnosis not present

## 2018-07-05 DIAGNOSIS — M25512 Pain in left shoulder: Secondary | ICD-10-CM | POA: Diagnosis not present

## 2018-07-05 DIAGNOSIS — M63821 Disorders of muscle in diseases classified elsewhere, right upper arm: Secondary | ICD-10-CM | POA: Diagnosis not present

## 2018-07-05 DIAGNOSIS — M4046 Postural lordosis, lumbar region: Secondary | ICD-10-CM | POA: Diagnosis not present

## 2018-07-05 DIAGNOSIS — M63822 Disorders of muscle in diseases classified elsewhere, left upper arm: Secondary | ICD-10-CM | POA: Diagnosis not present

## 2018-07-05 DIAGNOSIS — M7541 Impingement syndrome of right shoulder: Secondary | ICD-10-CM | POA: Diagnosis not present

## 2018-07-09 DIAGNOSIS — M25511 Pain in right shoulder: Secondary | ICD-10-CM | POA: Diagnosis not present

## 2018-07-09 DIAGNOSIS — M25512 Pain in left shoulder: Secondary | ICD-10-CM | POA: Diagnosis not present

## 2018-07-09 DIAGNOSIS — M7541 Impingement syndrome of right shoulder: Secondary | ICD-10-CM | POA: Diagnosis not present

## 2018-07-09 DIAGNOSIS — M63821 Disorders of muscle in diseases classified elsewhere, right upper arm: Secondary | ICD-10-CM | POA: Diagnosis not present

## 2018-07-09 DIAGNOSIS — M63822 Disorders of muscle in diseases classified elsewhere, left upper arm: Secondary | ICD-10-CM | POA: Diagnosis not present

## 2018-07-09 DIAGNOSIS — M4046 Postural lordosis, lumbar region: Secondary | ICD-10-CM | POA: Diagnosis not present

## 2018-07-12 DIAGNOSIS — M25512 Pain in left shoulder: Secondary | ICD-10-CM | POA: Diagnosis not present

## 2018-07-12 DIAGNOSIS — M25511 Pain in right shoulder: Secondary | ICD-10-CM | POA: Diagnosis not present

## 2018-07-12 DIAGNOSIS — M63822 Disorders of muscle in diseases classified elsewhere, left upper arm: Secondary | ICD-10-CM | POA: Diagnosis not present

## 2018-07-12 DIAGNOSIS — M7541 Impingement syndrome of right shoulder: Secondary | ICD-10-CM | POA: Diagnosis not present

## 2018-07-12 DIAGNOSIS — M4046 Postural lordosis, lumbar region: Secondary | ICD-10-CM | POA: Diagnosis not present

## 2018-07-12 DIAGNOSIS — M63821 Disorders of muscle in diseases classified elsewhere, right upper arm: Secondary | ICD-10-CM | POA: Diagnosis not present

## 2018-07-13 DIAGNOSIS — E782 Mixed hyperlipidemia: Secondary | ICD-10-CM | POA: Diagnosis not present

## 2018-07-13 DIAGNOSIS — E785 Hyperlipidemia, unspecified: Secondary | ICD-10-CM | POA: Diagnosis not present

## 2018-07-13 DIAGNOSIS — R739 Hyperglycemia, unspecified: Secondary | ICD-10-CM | POA: Diagnosis not present

## 2018-07-13 DIAGNOSIS — R350 Frequency of micturition: Secondary | ICD-10-CM | POA: Diagnosis not present

## 2018-07-13 DIAGNOSIS — E119 Type 2 diabetes mellitus without complications: Secondary | ICD-10-CM | POA: Diagnosis not present

## 2018-07-13 LAB — LIPID PANEL
Cholesterol: 156 (ref 0–200)
HDL: 49 (ref 35–70)
LDL Cholesterol: 80
Triglycerides: 137 (ref 40–160)

## 2018-07-13 LAB — PSA: PSA: 1.14

## 2018-07-13 LAB — HEMOGLOBIN A1C: Hemoglobin A1C: 6.3

## 2018-07-16 ENCOUNTER — Encounter: Payer: Self-pay | Admitting: Internal Medicine

## 2018-07-16 DIAGNOSIS — M63822 Disorders of muscle in diseases classified elsewhere, left upper arm: Secondary | ICD-10-CM | POA: Diagnosis not present

## 2018-07-16 DIAGNOSIS — M7542 Impingement syndrome of left shoulder: Secondary | ICD-10-CM | POA: Diagnosis not present

## 2018-07-16 DIAGNOSIS — M7541 Impingement syndrome of right shoulder: Secondary | ICD-10-CM | POA: Diagnosis not present

## 2018-07-16 DIAGNOSIS — M25512 Pain in left shoulder: Secondary | ICD-10-CM | POA: Diagnosis not present

## 2018-07-16 DIAGNOSIS — M63821 Disorders of muscle in diseases classified elsewhere, right upper arm: Secondary | ICD-10-CM | POA: Diagnosis not present

## 2018-07-16 DIAGNOSIS — M4046 Postural lordosis, lumbar region: Secondary | ICD-10-CM | POA: Diagnosis not present

## 2018-07-16 DIAGNOSIS — M25511 Pain in right shoulder: Secondary | ICD-10-CM | POA: Diagnosis not present

## 2018-07-19 DIAGNOSIS — M63821 Disorders of muscle in diseases classified elsewhere, right upper arm: Secondary | ICD-10-CM | POA: Diagnosis not present

## 2018-07-19 DIAGNOSIS — M7541 Impingement syndrome of right shoulder: Secondary | ICD-10-CM | POA: Diagnosis not present

## 2018-07-19 DIAGNOSIS — M4046 Postural lordosis, lumbar region: Secondary | ICD-10-CM | POA: Diagnosis not present

## 2018-07-19 DIAGNOSIS — M25512 Pain in left shoulder: Secondary | ICD-10-CM | POA: Diagnosis not present

## 2018-07-19 DIAGNOSIS — M25511 Pain in right shoulder: Secondary | ICD-10-CM | POA: Diagnosis not present

## 2018-07-19 DIAGNOSIS — M63822 Disorders of muscle in diseases classified elsewhere, left upper arm: Secondary | ICD-10-CM | POA: Diagnosis not present

## 2018-07-21 ENCOUNTER — Encounter: Payer: Self-pay | Admitting: Internal Medicine

## 2018-07-21 ENCOUNTER — Non-Acute Institutional Stay: Payer: Medicare Other | Admitting: Internal Medicine

## 2018-07-21 VITALS — BP 128/70 | HR 66 | Temp 98.5°F | Ht 68.0 in | Wt 172.0 lb

## 2018-07-21 DIAGNOSIS — M19042 Primary osteoarthritis, left hand: Secondary | ICD-10-CM

## 2018-07-21 DIAGNOSIS — R739 Hyperglycemia, unspecified: Secondary | ICD-10-CM

## 2018-07-21 DIAGNOSIS — K219 Gastro-esophageal reflux disease without esophagitis: Secondary | ICD-10-CM | POA: Diagnosis not present

## 2018-07-21 DIAGNOSIS — M7540 Impingement syndrome of unspecified shoulder: Secondary | ICD-10-CM

## 2018-07-21 DIAGNOSIS — H409 Unspecified glaucoma: Secondary | ICD-10-CM | POA: Diagnosis not present

## 2018-07-21 DIAGNOSIS — M25572 Pain in left ankle and joints of left foot: Secondary | ICD-10-CM | POA: Diagnosis not present

## 2018-07-21 DIAGNOSIS — E782 Mixed hyperlipidemia: Secondary | ICD-10-CM

## 2018-07-21 DIAGNOSIS — N399 Disorder of urinary system, unspecified: Secondary | ICD-10-CM | POA: Insufficient documentation

## 2018-07-21 DIAGNOSIS — M19041 Primary osteoarthritis, right hand: Secondary | ICD-10-CM | POA: Diagnosis not present

## 2018-07-21 NOTE — Progress Notes (Signed)
Location:  Occupational psychologist of Service:  Clinic (12)  Provider: Collen Vincent L. Mariea Clonts, D.O., C.M.D.  Goals of Care:  Advanced Directives 01/13/2018  Does Patient Have a Medical Advance Directive? Yes  Type of Advance Directive [place]  Does patient want to make changes to medical advance directive? No - Patient declined  Copy of [place] in Chart? Yes  Pre-existing out of facility DNR order (yellow form or pink MOST form) -    Chief Complaint  Patient presents with  . Medical Management of Chronic Issues    66mth follow-up, discuss labs    HPI: Patient is a 77 y.o. male seen today for medical management of chronic diseases.    Little things are bothering him that didn't used to bother him.    cholesterol improved with crestor.  He left his omeprazole and crestor at home when he went to [place]--just walked into the store and bought them.  Got them for $10 for two weeks.    His left foot has had something on it for 6 weeks.  There was a spot above his ankle bone.  It was stinging on the top and it has dissipated after the past 2-3 days.  Seems to have healed itself.  It was never an open wound.  He did get new shoes which may have solved it--?pressure.  Several years ago, he had a spider bite on his right heel.  It required a surgical procedure.    His two little fingers seems to have developed arthritis.  Gets a pain in the joints.  He has h/o trigger fingers. They only hurt if he bangs them.    Shoulder impingement--left arm 90% better, right is 70% better.  She's noticed some bad posture. Working with OT.  Main problem is reaching up on the right.    PSA in normal range.  He can go hours w/o using the bathroom.  When he stands up for his physical therapy exercises, he gets a strong urge to urinate even if he went 20 mins before.  He may also have a drop or two leak out sometimes after getting himself all put back  together--this is occasional.  Does not typically have frequency.  Goes all night w/o urinating.  He is trying to make sure he empties fully which does seem to help.   No visual changes.  Takes his eye drops.  Pressure has been stable.  Has mild glaucoma.  Sees Dr. Ellie Lunch.  Eventually he needs cataract surgery.  He admits to not seeing as well at night.    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  He's enjoying that.    Fraser Din is visiting their son in [place].  She'd been antsy, depressed and wanting to get out.  She's been sleeping more.  Cruise went very well.  She's now willing to take another cruise.  Did not need a walker.  He notices it is starting to affect her mentally.  He notices some hearing loss and more anxiety/bothered by more things.    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  He chooses sugar free desserts half the time, he quit jelly beans and stopped putting honey in his tea.  He has lost 5 lbs--not as hungry as he used to be--eats 1/2 a sandwich.  He has not reduced his alcohol intake.  He has his 2 scotches before bed and that helped him sleep.    No  issues with GERD on omeprazole.    Past Medical History:  Diagnosis Date  . Allergy    seasonal  . Arthritis    knees  . GERD (gastroesophageal reflux disease)   . Glaucoma   . Hemorrhoids   . Hyperlipidemia   . RBBB     Past Surgical History:  Procedure Laterality Date  . CARDIAC CATHETERIZATION  1993  . COLONOSCOPY  2002; 2007  . POLYPECTOMY      Allergies  Allergen Reactions  . Black Pepper [[place]] Other (See Comments)    bloating  . Ciprocinonide [Fluocinolone]     bleeding  . Nsaids     History of bleeding    Outpatient Encounter Medications as of 07/21/2018  Medication Sig  . Ascorbic Acid (VITAMIN C) 1000 MG tablet Take 1,000 mg by mouth daily as needed (onset of cold symptoms). Reported on 01/02/2016  . b complex vitamins tablet Take 1 tablet by mouth daily.  . bimatoprost (LUMIGAN) 0.01 % SOLN  Place 1 drop into both eyes at bedtime.   . cetirizine (ZYRTEC) 10 MG tablet Take 10 mg by mouth daily as needed.   . Cholecalciferol (VITAMIN D) 2000 UNITS CAPS Take 1 capsule (2,000 Units total) by mouth daily.  . folic acid (FOLVITE) 790 MCG tablet Take 800 mcg by mouth daily.   . Glucosamine-Chondroit-Vit C-Mn (GLUCOSAMINE CHONDR 1500 COMPLX PO) Take 1,500 mg by mouth daily.   Marland Kitchen omeprazole (PRILOSEC) 40 MG capsule TAKE ONE CAPSULE BY MOUTH DAILY  . rosuvastatin (CRESTOR) 5 MG tablet TAKE ONE TABLET BY MOUTH DAILY  . timolol (TIMOPTIC) 0.5 % ophthalmic solution One drop in both eyes once a day in morning for glaucoma   No facility-administered encounter medications on file as of 07/21/2018.     Review of Systems:  Review of Systems  Constitutional: Positive for weight loss. Negative for chills, fever and malaise/fatigue.  HENT: Negative for congestion.   Eyes: Negative for blurred vision.  Respiratory: Negative for shortness of breath.   Cardiovascular: Negative for chest pain, palpitations and leg swelling.  Gastrointestinal: Negative for abdominal pain, blood in stool, constipation, diarrhea, heartburn and melena.       Controlled gerd with ppi  Genitourinary: Negative for dysuria, flank pain, frequency, hematuria and urgency.       Must urinate when he stands up to do his shoulder exercises even if he went recently   Musculoskeletal: Positive for joint pain and myalgias. Negative for falls.  Skin:       Small red spot and pain over left lateral ankle bone  Neurological: Negative for dizziness, tingling, sensory change and loss of consciousness.  Psychiatric/Behavioral: Negative for depression and memory loss. The patient is not nervous/anxious and does not have insomnia.        Concern about his wife with PD    Health Maintenance  Topic Date Due  . COLONOSCOPY  01/23/2019  . TETANUS/TDAP  12/09/2023  . INFLUENZA VACCINE  Completed  . PNA vac Low Risk Adult  Completed     Physical Exam: Vitals:   07/21/18 1110  BP: 128/70  Pulse: 66  Temp: 98.5 F (36.9 C)  TempSrc: Oral  SpO2: 99%  Weight: 172 lb (78 kg)  Height: 5\' 8"  (1.727 m)   Body mass index is 26.15 kg/m. Physical Exam  Constitutional: He is oriented to person, place, and time. He appears well-developed and well-nourished. No distress.  HENT:  Head: Normocephalic and atraumatic.  Eyes:  glasses  Cardiovascular:  Normal rate, regular rhythm, normal heart sounds and intact distal pulses.  Pulmonary/Chest: Effort normal and breath sounds normal. No respiratory distress.  Abdominal: Bowel sounds are normal.  Musculoskeletal: Normal range of motion. He exhibits no tenderness.  Neurological: He is alert and oriented to person, place, and time.  Skin: Capillary refill takes less than 2 seconds.  Small red area, flat, over lateral malleolus of left ankle, nontender at this point  Psychiatric: He has a normal mood and affect.    Labs reviewed: Basic Metabolic Panel: Recent Labs    01/05/18 0600  NA 142  K 4.3  BUN 11  CREATININE 1.1   Liver Function Tests: No results for input(s): AST, ALT, ALKPHOS, BILITOT, PROT, ALBUMIN in the last 8760 hours. No results for input(s): LIPASE, AMYLASE in the last 8760 hours. No results for input(s): AMMONIA in the last 8760 hours. CBC: Recent Labs    01/05/18 0600  WBC 6.7  HGB 13.6  HCT 42  PLT 275   Lipid Panel: Recent Labs    01/05/18 0600 07/13/18 0500  CHOL 182 156  HDL 48 49  LDLCALC 104 80  TRIG 148 137   Lab Results  Component Value Date   HGBA1C 6.3 07/13/2018   Assessment/Plan 1. Mixed hyperlipidemia -cont crestor which has improved his LDL and TG  2. Hyperglycemia -has trended up and discussed again the need to reduce his scotch intake and he will try to do this  3. Gastroesophageal reflux disease, esophagitis presence not specified -cont omeprazole therapy--has tried and cannot go w/o it  4. Rotator cuff  impingement syndrome, unspecified laterality -cont OT [facility] at [facility]  5. Mild stage glaucoma -cont timoptic drops  6. 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 Urinary problem in male -odd presentation of just having to go when he stands and does his shoulder therapy -does not have other typical LUTS symptoms from BPH and PSA was normal -advised to monitor and let me know if worsens requiring urology referral--suggested Dr. Matilde Sprang if needed  Next appt:  6 mos med mgt  Sicilia Killough L. Devi Hopman, D.O. [facility] [address], [place] [address] Cell Phone (Mon-Fri 8am-5pm):  [phone] On Call:  [phone] & follow prompts after 5pm & weekends Office Phone:  [phone] Office Fax:  [phone]

## 2018-07-23 DIAGNOSIS — M63822 Disorders of muscle in diseases classified elsewhere, left upper arm: Secondary | ICD-10-CM | POA: Diagnosis not present

## 2018-07-23 DIAGNOSIS — M4046 Postural lordosis, lumbar region: Secondary | ICD-10-CM | POA: Diagnosis not present

## 2018-07-23 DIAGNOSIS — M25512 Pain in left shoulder: Secondary | ICD-10-CM | POA: Diagnosis not present

## 2018-07-23 DIAGNOSIS — M25511 Pain in right shoulder: Secondary | ICD-10-CM | POA: Diagnosis not present

## 2018-07-23 DIAGNOSIS — M7541 Impingement syndrome of right shoulder: Secondary | ICD-10-CM | POA: Diagnosis not present

## 2018-07-23 DIAGNOSIS — M63821 Disorders of muscle in diseases classified elsewhere, right upper arm: Secondary | ICD-10-CM | POA: Diagnosis not present

## 2018-07-26 DIAGNOSIS — M7541 Impingement syndrome of right shoulder: Secondary | ICD-10-CM | POA: Diagnosis not present

## 2018-07-26 DIAGNOSIS — M4046 Postural lordosis, lumbar region: Secondary | ICD-10-CM | POA: Diagnosis not present

## 2018-07-26 DIAGNOSIS — M63821 Disorders of muscle in diseases classified elsewhere, right upper arm: Secondary | ICD-10-CM | POA: Diagnosis not present

## 2018-07-26 DIAGNOSIS — M25511 Pain in right shoulder: Secondary | ICD-10-CM | POA: Diagnosis not present

## 2018-07-26 DIAGNOSIS — M63822 Disorders of muscle in diseases classified elsewhere, left upper arm: Secondary | ICD-10-CM | POA: Diagnosis not present

## 2018-07-26 DIAGNOSIS — M25512 Pain in left shoulder: Secondary | ICD-10-CM | POA: Diagnosis not present

## 2018-07-28 DIAGNOSIS — M63822 Disorders of muscle in diseases classified elsewhere, left upper arm: Secondary | ICD-10-CM | POA: Diagnosis not present

## 2018-07-28 DIAGNOSIS — M7541 Impingement syndrome of right shoulder: Secondary | ICD-10-CM | POA: Diagnosis not present

## 2018-07-28 DIAGNOSIS — M25511 Pain in right shoulder: Secondary | ICD-10-CM | POA: Diagnosis not present

## 2018-07-28 DIAGNOSIS — M25512 Pain in left shoulder: Secondary | ICD-10-CM | POA: Diagnosis not present

## 2018-07-28 DIAGNOSIS — M4046 Postural lordosis, lumbar region: Secondary | ICD-10-CM | POA: Diagnosis not present

## 2018-07-28 DIAGNOSIS — M63821 Disorders of muscle in diseases classified elsewhere, right upper arm: Secondary | ICD-10-CM | POA: Diagnosis not present

## 2018-08-02 DIAGNOSIS — M63822 Disorders of muscle in diseases classified elsewhere, left upper arm: Secondary | ICD-10-CM | POA: Diagnosis not present

## 2018-08-02 DIAGNOSIS — M25512 Pain in left shoulder: Secondary | ICD-10-CM | POA: Diagnosis not present

## 2018-08-02 DIAGNOSIS — M4046 Postural lordosis, lumbar region: Secondary | ICD-10-CM | POA: Diagnosis not present

## 2018-08-02 DIAGNOSIS — M63821 Disorders of muscle in diseases classified elsewhere, right upper arm: Secondary | ICD-10-CM | POA: Diagnosis not present

## 2018-08-02 DIAGNOSIS — M7541 Impingement syndrome of right shoulder: Secondary | ICD-10-CM | POA: Diagnosis not present

## 2018-08-02 DIAGNOSIS — M25511 Pain in right shoulder: Secondary | ICD-10-CM | POA: Diagnosis not present

## 2018-08-06 DIAGNOSIS — M63822 Disorders of muscle in diseases classified elsewhere, left upper arm: Secondary | ICD-10-CM | POA: Diagnosis not present

## 2018-08-06 DIAGNOSIS — M25511 Pain in right shoulder: Secondary | ICD-10-CM | POA: Diagnosis not present

## 2018-08-06 DIAGNOSIS — M7541 Impingement syndrome of right shoulder: Secondary | ICD-10-CM | POA: Diagnosis not present

## 2018-08-06 DIAGNOSIS — M25512 Pain in left shoulder: Secondary | ICD-10-CM | POA: Diagnosis not present

## 2018-08-06 DIAGNOSIS — M4046 Postural lordosis, lumbar region: Secondary | ICD-10-CM | POA: Diagnosis not present

## 2018-08-06 DIAGNOSIS — M63821 Disorders of muscle in diseases classified elsewhere, right upper arm: Secondary | ICD-10-CM | POA: Diagnosis not present

## 2018-08-09 DIAGNOSIS — M25511 Pain in right shoulder: Secondary | ICD-10-CM | POA: Diagnosis not present

## 2018-08-09 DIAGNOSIS — M25512 Pain in left shoulder: Secondary | ICD-10-CM | POA: Diagnosis not present

## 2018-08-09 DIAGNOSIS — M7541 Impingement syndrome of right shoulder: Secondary | ICD-10-CM | POA: Diagnosis not present

## 2018-08-09 DIAGNOSIS — M63821 Disorders of muscle in diseases classified elsewhere, right upper arm: Secondary | ICD-10-CM | POA: Diagnosis not present

## 2018-08-09 DIAGNOSIS — M4046 Postural lordosis, lumbar region: Secondary | ICD-10-CM | POA: Diagnosis not present

## 2018-08-09 DIAGNOSIS — M63822 Disorders of muscle in diseases classified elsewhere, left upper arm: Secondary | ICD-10-CM | POA: Diagnosis not present

## 2018-08-11 DIAGNOSIS — M63822 Disorders of muscle in diseases classified elsewhere, left upper arm: Secondary | ICD-10-CM | POA: Diagnosis not present

## 2018-08-11 DIAGNOSIS — M4046 Postural lordosis, lumbar region: Secondary | ICD-10-CM | POA: Diagnosis not present

## 2018-08-11 DIAGNOSIS — M63821 Disorders of muscle in diseases classified elsewhere, right upper arm: Secondary | ICD-10-CM | POA: Diagnosis not present

## 2018-08-11 DIAGNOSIS — M25511 Pain in right shoulder: Secondary | ICD-10-CM | POA: Diagnosis not present

## 2018-08-11 DIAGNOSIS — M7541 Impingement syndrome of right shoulder: Secondary | ICD-10-CM | POA: Diagnosis not present

## 2018-08-11 DIAGNOSIS — M25512 Pain in left shoulder: Secondary | ICD-10-CM | POA: Diagnosis not present

## 2018-08-16 DIAGNOSIS — M63821 Disorders of muscle in diseases classified elsewhere, right upper arm: Secondary | ICD-10-CM | POA: Diagnosis not present

## 2018-08-16 DIAGNOSIS — M7541 Impingement syndrome of right shoulder: Secondary | ICD-10-CM | POA: Diagnosis not present

## 2018-08-16 DIAGNOSIS — M4046 Postural lordosis, lumbar region: Secondary | ICD-10-CM | POA: Diagnosis not present

## 2018-08-16 DIAGNOSIS — M7542 Impingement syndrome of left shoulder: Secondary | ICD-10-CM | POA: Diagnosis not present

## 2018-08-16 DIAGNOSIS — M25512 Pain in left shoulder: Secondary | ICD-10-CM | POA: Diagnosis not present

## 2018-08-16 DIAGNOSIS — M25511 Pain in right shoulder: Secondary | ICD-10-CM | POA: Diagnosis not present

## 2018-08-16 DIAGNOSIS — M63822 Disorders of muscle in diseases classified elsewhere, left upper arm: Secondary | ICD-10-CM | POA: Diagnosis not present

## 2018-08-18 DIAGNOSIS — M63821 Disorders of muscle in diseases classified elsewhere, right upper arm: Secondary | ICD-10-CM | POA: Diagnosis not present

## 2018-08-18 DIAGNOSIS — M25512 Pain in left shoulder: Secondary | ICD-10-CM | POA: Diagnosis not present

## 2018-08-18 DIAGNOSIS — M7541 Impingement syndrome of right shoulder: Secondary | ICD-10-CM | POA: Diagnosis not present

## 2018-08-18 DIAGNOSIS — M25511 Pain in right shoulder: Secondary | ICD-10-CM | POA: Diagnosis not present

## 2018-08-18 DIAGNOSIS — M63822 Disorders of muscle in diseases classified elsewhere, left upper arm: Secondary | ICD-10-CM | POA: Diagnosis not present

## 2018-08-18 DIAGNOSIS — M4046 Postural lordosis, lumbar region: Secondary | ICD-10-CM | POA: Diagnosis not present

## 2018-08-24 DIAGNOSIS — M63821 Disorders of muscle in diseases classified elsewhere, right upper arm: Secondary | ICD-10-CM | POA: Diagnosis not present

## 2018-08-24 DIAGNOSIS — M63822 Disorders of muscle in diseases classified elsewhere, left upper arm: Secondary | ICD-10-CM | POA: Diagnosis not present

## 2018-08-24 DIAGNOSIS — M7541 Impingement syndrome of right shoulder: Secondary | ICD-10-CM | POA: Diagnosis not present

## 2018-08-24 DIAGNOSIS — M25512 Pain in left shoulder: Secondary | ICD-10-CM | POA: Diagnosis not present

## 2018-08-24 DIAGNOSIS — M25511 Pain in right shoulder: Secondary | ICD-10-CM | POA: Diagnosis not present

## 2018-08-24 DIAGNOSIS — M4046 Postural lordosis, lumbar region: Secondary | ICD-10-CM | POA: Diagnosis not present

## 2018-08-27 DIAGNOSIS — M25511 Pain in right shoulder: Secondary | ICD-10-CM | POA: Diagnosis not present

## 2018-08-27 DIAGNOSIS — M63821 Disorders of muscle in diseases classified elsewhere, right upper arm: Secondary | ICD-10-CM | POA: Diagnosis not present

## 2018-08-27 DIAGNOSIS — M7541 Impingement syndrome of right shoulder: Secondary | ICD-10-CM | POA: Diagnosis not present

## 2018-08-27 DIAGNOSIS — M25512 Pain in left shoulder: Secondary | ICD-10-CM | POA: Diagnosis not present

## 2018-08-27 DIAGNOSIS — M63822 Disorders of muscle in diseases classified elsewhere, left upper arm: Secondary | ICD-10-CM | POA: Diagnosis not present

## 2018-08-27 DIAGNOSIS — M4046 Postural lordosis, lumbar region: Secondary | ICD-10-CM | POA: Diagnosis not present

## 2018-08-30 DIAGNOSIS — M25511 Pain in right shoulder: Secondary | ICD-10-CM | POA: Diagnosis not present

## 2018-08-30 DIAGNOSIS — M63821 Disorders of muscle in diseases classified elsewhere, right upper arm: Secondary | ICD-10-CM | POA: Diagnosis not present

## 2018-08-30 DIAGNOSIS — M25512 Pain in left shoulder: Secondary | ICD-10-CM | POA: Diagnosis not present

## 2018-08-30 DIAGNOSIS — M7541 Impingement syndrome of right shoulder: Secondary | ICD-10-CM | POA: Diagnosis not present

## 2018-08-30 DIAGNOSIS — M63822 Disorders of muscle in diseases classified elsewhere, left upper arm: Secondary | ICD-10-CM | POA: Diagnosis not present

## 2018-08-30 DIAGNOSIS — M4046 Postural lordosis, lumbar region: Secondary | ICD-10-CM | POA: Diagnosis not present

## 2018-09-07 DIAGNOSIS — M7541 Impingement syndrome of right shoulder: Secondary | ICD-10-CM | POA: Diagnosis not present

## 2018-09-07 DIAGNOSIS — M25511 Pain in right shoulder: Secondary | ICD-10-CM | POA: Diagnosis not present

## 2018-09-07 DIAGNOSIS — M25512 Pain in left shoulder: Secondary | ICD-10-CM | POA: Diagnosis not present

## 2018-09-07 DIAGNOSIS — M63822 Disorders of muscle in diseases classified elsewhere, left upper arm: Secondary | ICD-10-CM | POA: Diagnosis not present

## 2018-09-07 DIAGNOSIS — M63821 Disorders of muscle in diseases classified elsewhere, right upper arm: Secondary | ICD-10-CM | POA: Diagnosis not present

## 2018-09-07 DIAGNOSIS — M4046 Postural lordosis, lumbar region: Secondary | ICD-10-CM | POA: Diagnosis not present

## 2018-09-09 DIAGNOSIS — M4046 Postural lordosis, lumbar region: Secondary | ICD-10-CM | POA: Diagnosis not present

## 2018-09-09 DIAGNOSIS — M7541 Impingement syndrome of right shoulder: Secondary | ICD-10-CM | POA: Diagnosis not present

## 2018-09-09 DIAGNOSIS — M25511 Pain in right shoulder: Secondary | ICD-10-CM | POA: Diagnosis not present

## 2018-09-09 DIAGNOSIS — M63822 Disorders of muscle in diseases classified elsewhere, left upper arm: Secondary | ICD-10-CM | POA: Diagnosis not present

## 2018-09-09 DIAGNOSIS — M63821 Disorders of muscle in diseases classified elsewhere, right upper arm: Secondary | ICD-10-CM | POA: Diagnosis not present

## 2018-09-09 DIAGNOSIS — M25512 Pain in left shoulder: Secondary | ICD-10-CM | POA: Diagnosis not present

## 2018-09-13 DIAGNOSIS — M4046 Postural lordosis, lumbar region: Secondary | ICD-10-CM | POA: Diagnosis not present

## 2018-09-13 DIAGNOSIS — M25511 Pain in right shoulder: Secondary | ICD-10-CM | POA: Diagnosis not present

## 2018-09-13 DIAGNOSIS — M63822 Disorders of muscle in diseases classified elsewhere, left upper arm: Secondary | ICD-10-CM | POA: Diagnosis not present

## 2018-09-13 DIAGNOSIS — M63821 Disorders of muscle in diseases classified elsewhere, right upper arm: Secondary | ICD-10-CM | POA: Diagnosis not present

## 2018-09-13 DIAGNOSIS — M7541 Impingement syndrome of right shoulder: Secondary | ICD-10-CM | POA: Diagnosis not present

## 2018-09-13 DIAGNOSIS — M25512 Pain in left shoulder: Secondary | ICD-10-CM | POA: Diagnosis not present

## 2018-09-17 DIAGNOSIS — M25512 Pain in left shoulder: Secondary | ICD-10-CM | POA: Diagnosis not present

## 2018-09-17 DIAGNOSIS — M4046 Postural lordosis, lumbar region: Secondary | ICD-10-CM | POA: Diagnosis not present

## 2018-09-17 DIAGNOSIS — M7541 Impingement syndrome of right shoulder: Secondary | ICD-10-CM | POA: Diagnosis not present

## 2018-09-17 DIAGNOSIS — M63822 Disorders of muscle in diseases classified elsewhere, left upper arm: Secondary | ICD-10-CM | POA: Diagnosis not present

## 2018-09-17 DIAGNOSIS — M25511 Pain in right shoulder: Secondary | ICD-10-CM | POA: Diagnosis not present

## 2018-09-17 DIAGNOSIS — M63821 Disorders of muscle in diseases classified elsewhere, right upper arm: Secondary | ICD-10-CM | POA: Diagnosis not present

## 2018-09-17 DIAGNOSIS — M7542 Impingement syndrome of left shoulder: Secondary | ICD-10-CM | POA: Diagnosis not present

## 2018-09-21 DIAGNOSIS — M25511 Pain in right shoulder: Secondary | ICD-10-CM | POA: Diagnosis not present

## 2018-09-21 DIAGNOSIS — M63821 Disorders of muscle in diseases classified elsewhere, right upper arm: Secondary | ICD-10-CM | POA: Diagnosis not present

## 2018-09-21 DIAGNOSIS — M63822 Disorders of muscle in diseases classified elsewhere, left upper arm: Secondary | ICD-10-CM | POA: Diagnosis not present

## 2018-09-21 DIAGNOSIS — M25512 Pain in left shoulder: Secondary | ICD-10-CM | POA: Diagnosis not present

## 2018-09-21 DIAGNOSIS — M4046 Postural lordosis, lumbar region: Secondary | ICD-10-CM | POA: Diagnosis not present

## 2018-09-21 DIAGNOSIS — M7541 Impingement syndrome of right shoulder: Secondary | ICD-10-CM | POA: Diagnosis not present

## 2018-09-22 ENCOUNTER — Encounter: Payer: Self-pay | Admitting: Internal Medicine

## 2018-09-24 DIAGNOSIS — M63822 Disorders of muscle in diseases classified elsewhere, left upper arm: Secondary | ICD-10-CM | POA: Diagnosis not present

## 2018-09-24 DIAGNOSIS — M7541 Impingement syndrome of right shoulder: Secondary | ICD-10-CM | POA: Diagnosis not present

## 2018-09-24 DIAGNOSIS — M63821 Disorders of muscle in diseases classified elsewhere, right upper arm: Secondary | ICD-10-CM | POA: Diagnosis not present

## 2018-09-24 DIAGNOSIS — M25511 Pain in right shoulder: Secondary | ICD-10-CM | POA: Diagnosis not present

## 2018-09-24 DIAGNOSIS — M4046 Postural lordosis, lumbar region: Secondary | ICD-10-CM | POA: Diagnosis not present

## 2018-09-24 DIAGNOSIS — M25512 Pain in left shoulder: Secondary | ICD-10-CM | POA: Diagnosis not present

## 2018-09-28 DIAGNOSIS — M7541 Impingement syndrome of right shoulder: Secondary | ICD-10-CM | POA: Diagnosis not present

## 2018-09-28 DIAGNOSIS — M25511 Pain in right shoulder: Secondary | ICD-10-CM | POA: Diagnosis not present

## 2018-09-28 DIAGNOSIS — M63821 Disorders of muscle in diseases classified elsewhere, right upper arm: Secondary | ICD-10-CM | POA: Diagnosis not present

## 2018-09-28 DIAGNOSIS — M25512 Pain in left shoulder: Secondary | ICD-10-CM | POA: Diagnosis not present

## 2018-09-28 DIAGNOSIS — M63822 Disorders of muscle in diseases classified elsewhere, left upper arm: Secondary | ICD-10-CM | POA: Diagnosis not present

## 2018-09-28 DIAGNOSIS — M4046 Postural lordosis, lumbar region: Secondary | ICD-10-CM | POA: Diagnosis not present

## 2018-10-01 DIAGNOSIS — M7541 Impingement syndrome of right shoulder: Secondary | ICD-10-CM | POA: Diagnosis not present

## 2018-10-01 DIAGNOSIS — M4046 Postural lordosis, lumbar region: Secondary | ICD-10-CM | POA: Diagnosis not present

## 2018-10-01 DIAGNOSIS — M25511 Pain in right shoulder: Secondary | ICD-10-CM | POA: Diagnosis not present

## 2018-10-01 DIAGNOSIS — M63822 Disorders of muscle in diseases classified elsewhere, left upper arm: Secondary | ICD-10-CM | POA: Diagnosis not present

## 2018-10-01 DIAGNOSIS — M25512 Pain in left shoulder: Secondary | ICD-10-CM | POA: Diagnosis not present

## 2018-10-01 DIAGNOSIS — M63821 Disorders of muscle in diseases classified elsewhere, right upper arm: Secondary | ICD-10-CM | POA: Diagnosis not present

## 2018-10-04 DIAGNOSIS — M25512 Pain in left shoulder: Secondary | ICD-10-CM | POA: Diagnosis not present

## 2018-10-04 DIAGNOSIS — M7541 Impingement syndrome of right shoulder: Secondary | ICD-10-CM | POA: Diagnosis not present

## 2018-10-04 DIAGNOSIS — M63822 Disorders of muscle in diseases classified elsewhere, left upper arm: Secondary | ICD-10-CM | POA: Diagnosis not present

## 2018-10-04 DIAGNOSIS — M63821 Disorders of muscle in diseases classified elsewhere, right upper arm: Secondary | ICD-10-CM | POA: Diagnosis not present

## 2018-10-04 DIAGNOSIS — M25511 Pain in right shoulder: Secondary | ICD-10-CM | POA: Diagnosis not present

## 2018-10-04 DIAGNOSIS — M4046 Postural lordosis, lumbar region: Secondary | ICD-10-CM | POA: Diagnosis not present

## 2018-10-08 DIAGNOSIS — M25512 Pain in left shoulder: Secondary | ICD-10-CM | POA: Diagnosis not present

## 2018-10-08 DIAGNOSIS — M63822 Disorders of muscle in diseases classified elsewhere, left upper arm: Secondary | ICD-10-CM | POA: Diagnosis not present

## 2018-10-08 DIAGNOSIS — M4046 Postural lordosis, lumbar region: Secondary | ICD-10-CM | POA: Diagnosis not present

## 2018-10-08 DIAGNOSIS — M63821 Disorders of muscle in diseases classified elsewhere, right upper arm: Secondary | ICD-10-CM | POA: Diagnosis not present

## 2018-10-08 DIAGNOSIS — M7541 Impingement syndrome of right shoulder: Secondary | ICD-10-CM | POA: Diagnosis not present

## 2018-10-08 DIAGNOSIS — M25511 Pain in right shoulder: Secondary | ICD-10-CM | POA: Diagnosis not present

## 2018-10-11 DIAGNOSIS — M4046 Postural lordosis, lumbar region: Secondary | ICD-10-CM | POA: Diagnosis not present

## 2018-10-11 DIAGNOSIS — M63821 Disorders of muscle in diseases classified elsewhere, right upper arm: Secondary | ICD-10-CM | POA: Diagnosis not present

## 2018-10-11 DIAGNOSIS — M63822 Disorders of muscle in diseases classified elsewhere, left upper arm: Secondary | ICD-10-CM | POA: Diagnosis not present

## 2018-10-11 DIAGNOSIS — M25511 Pain in right shoulder: Secondary | ICD-10-CM | POA: Diagnosis not present

## 2018-10-11 DIAGNOSIS — M7541 Impingement syndrome of right shoulder: Secondary | ICD-10-CM | POA: Diagnosis not present

## 2018-10-11 DIAGNOSIS — M25512 Pain in left shoulder: Secondary | ICD-10-CM | POA: Diagnosis not present

## 2018-10-15 DIAGNOSIS — M63821 Disorders of muscle in diseases classified elsewhere, right upper arm: Secondary | ICD-10-CM | POA: Diagnosis not present

## 2018-10-15 DIAGNOSIS — M25512 Pain in left shoulder: Secondary | ICD-10-CM | POA: Diagnosis not present

## 2018-10-15 DIAGNOSIS — M25511 Pain in right shoulder: Secondary | ICD-10-CM | POA: Diagnosis not present

## 2018-10-15 DIAGNOSIS — M63822 Disorders of muscle in diseases classified elsewhere, left upper arm: Secondary | ICD-10-CM | POA: Diagnosis not present

## 2018-10-15 DIAGNOSIS — M4046 Postural lordosis, lumbar region: Secondary | ICD-10-CM | POA: Diagnosis not present

## 2018-10-15 DIAGNOSIS — M7541 Impingement syndrome of right shoulder: Secondary | ICD-10-CM | POA: Diagnosis not present

## 2018-10-18 DIAGNOSIS — M7542 Impingement syndrome of left shoulder: Secondary | ICD-10-CM | POA: Diagnosis not present

## 2018-10-18 DIAGNOSIS — M4046 Postural lordosis, lumbar region: Secondary | ICD-10-CM | POA: Diagnosis not present

## 2018-10-18 DIAGNOSIS — M25512 Pain in left shoulder: Secondary | ICD-10-CM | POA: Diagnosis not present

## 2018-10-18 DIAGNOSIS — M63822 Disorders of muscle in diseases classified elsewhere, left upper arm: Secondary | ICD-10-CM | POA: Diagnosis not present

## 2018-10-18 DIAGNOSIS — M7541 Impingement syndrome of right shoulder: Secondary | ICD-10-CM | POA: Diagnosis not present

## 2018-10-18 DIAGNOSIS — M25511 Pain in right shoulder: Secondary | ICD-10-CM | POA: Diagnosis not present

## 2018-10-18 DIAGNOSIS — M63821 Disorders of muscle in diseases classified elsewhere, right upper arm: Secondary | ICD-10-CM | POA: Diagnosis not present

## 2018-10-22 DIAGNOSIS — M25512 Pain in left shoulder: Secondary | ICD-10-CM | POA: Diagnosis not present

## 2018-10-22 DIAGNOSIS — M63822 Disorders of muscle in diseases classified elsewhere, left upper arm: Secondary | ICD-10-CM | POA: Diagnosis not present

## 2018-10-22 DIAGNOSIS — M7541 Impingement syndrome of right shoulder: Secondary | ICD-10-CM | POA: Diagnosis not present

## 2018-10-22 DIAGNOSIS — M4046 Postural lordosis, lumbar region: Secondary | ICD-10-CM | POA: Diagnosis not present

## 2018-10-22 DIAGNOSIS — M25511 Pain in right shoulder: Secondary | ICD-10-CM | POA: Diagnosis not present

## 2018-10-22 DIAGNOSIS — M63821 Disorders of muscle in diseases classified elsewhere, right upper arm: Secondary | ICD-10-CM | POA: Diagnosis not present

## 2018-10-24 ENCOUNTER — Encounter: Payer: Self-pay | Admitting: Internal Medicine

## 2018-10-25 DIAGNOSIS — M4046 Postural lordosis, lumbar region: Secondary | ICD-10-CM | POA: Diagnosis not present

## 2018-10-25 DIAGNOSIS — M25511 Pain in right shoulder: Secondary | ICD-10-CM | POA: Diagnosis not present

## 2018-10-25 DIAGNOSIS — M25512 Pain in left shoulder: Secondary | ICD-10-CM | POA: Diagnosis not present

## 2018-10-25 DIAGNOSIS — M7541 Impingement syndrome of right shoulder: Secondary | ICD-10-CM | POA: Diagnosis not present

## 2018-10-25 DIAGNOSIS — M63822 Disorders of muscle in diseases classified elsewhere, left upper arm: Secondary | ICD-10-CM | POA: Diagnosis not present

## 2018-10-25 DIAGNOSIS — M63821 Disorders of muscle in diseases classified elsewhere, right upper arm: Secondary | ICD-10-CM | POA: Diagnosis not present

## 2018-10-27 DIAGNOSIS — H2513 Age-related nuclear cataract, bilateral: Secondary | ICD-10-CM | POA: Diagnosis not present

## 2018-10-27 DIAGNOSIS — H401132 Primary open-angle glaucoma, bilateral, moderate stage: Secondary | ICD-10-CM | POA: Diagnosis not present

## 2018-10-27 DIAGNOSIS — H52203 Unspecified astigmatism, bilateral: Secondary | ICD-10-CM | POA: Diagnosis not present

## 2018-11-02 DIAGNOSIS — M63822 Disorders of muscle in diseases classified elsewhere, left upper arm: Secondary | ICD-10-CM | POA: Diagnosis not present

## 2018-11-02 DIAGNOSIS — M25512 Pain in left shoulder: Secondary | ICD-10-CM | POA: Diagnosis not present

## 2018-11-02 DIAGNOSIS — M25511 Pain in right shoulder: Secondary | ICD-10-CM | POA: Diagnosis not present

## 2018-11-02 DIAGNOSIS — M4046 Postural lordosis, lumbar region: Secondary | ICD-10-CM | POA: Diagnosis not present

## 2018-11-02 DIAGNOSIS — M63821 Disorders of muscle in diseases classified elsewhere, right upper arm: Secondary | ICD-10-CM | POA: Diagnosis not present

## 2018-11-02 DIAGNOSIS — M7541 Impingement syndrome of right shoulder: Secondary | ICD-10-CM | POA: Diagnosis not present

## 2018-11-05 DIAGNOSIS — M63821 Disorders of muscle in diseases classified elsewhere, right upper arm: Secondary | ICD-10-CM | POA: Diagnosis not present

## 2018-11-05 DIAGNOSIS — M7541 Impingement syndrome of right shoulder: Secondary | ICD-10-CM | POA: Diagnosis not present

## 2018-11-05 DIAGNOSIS — M63822 Disorders of muscle in diseases classified elsewhere, left upper arm: Secondary | ICD-10-CM | POA: Diagnosis not present

## 2018-11-05 DIAGNOSIS — M25512 Pain in left shoulder: Secondary | ICD-10-CM | POA: Diagnosis not present

## 2018-11-05 DIAGNOSIS — M4046 Postural lordosis, lumbar region: Secondary | ICD-10-CM | POA: Diagnosis not present

## 2018-11-05 DIAGNOSIS — M25511 Pain in right shoulder: Secondary | ICD-10-CM | POA: Diagnosis not present

## 2018-11-09 DIAGNOSIS — M63821 Disorders of muscle in diseases classified elsewhere, right upper arm: Secondary | ICD-10-CM | POA: Diagnosis not present

## 2018-11-09 DIAGNOSIS — M25512 Pain in left shoulder: Secondary | ICD-10-CM | POA: Diagnosis not present

## 2018-11-09 DIAGNOSIS — M4046 Postural lordosis, lumbar region: Secondary | ICD-10-CM | POA: Diagnosis not present

## 2018-11-09 DIAGNOSIS — M63822 Disorders of muscle in diseases classified elsewhere, left upper arm: Secondary | ICD-10-CM | POA: Diagnosis not present

## 2018-11-09 DIAGNOSIS — M25511 Pain in right shoulder: Secondary | ICD-10-CM | POA: Diagnosis not present

## 2018-11-09 DIAGNOSIS — M7541 Impingement syndrome of right shoulder: Secondary | ICD-10-CM | POA: Diagnosis not present

## 2018-11-12 ENCOUNTER — Telehealth: Payer: Self-pay | Admitting: *Deleted

## 2018-11-12 DIAGNOSIS — M25511 Pain in right shoulder: Secondary | ICD-10-CM | POA: Diagnosis not present

## 2018-11-12 DIAGNOSIS — M4046 Postural lordosis, lumbar region: Secondary | ICD-10-CM | POA: Diagnosis not present

## 2018-11-12 DIAGNOSIS — M25512 Pain in left shoulder: Secondary | ICD-10-CM | POA: Diagnosis not present

## 2018-11-12 DIAGNOSIS — M7541 Impingement syndrome of right shoulder: Secondary | ICD-10-CM | POA: Diagnosis not present

## 2018-11-12 DIAGNOSIS — G8929 Other chronic pain: Secondary | ICD-10-CM

## 2018-11-12 DIAGNOSIS — M63822 Disorders of muscle in diseases classified elsewhere, left upper arm: Secondary | ICD-10-CM | POA: Diagnosis not present

## 2018-11-12 DIAGNOSIS — M63821 Disorders of muscle in diseases classified elsewhere, right upper arm: Secondary | ICD-10-CM | POA: Diagnosis not present

## 2018-11-12 NOTE — Telephone Encounter (Signed)
Please see letter in your folder from therapy. Melissa Willeford occupational therapist is asking for a xray of patient's right shoulder.

## 2018-11-15 DIAGNOSIS — M25512 Pain in left shoulder: Secondary | ICD-10-CM | POA: Diagnosis not present

## 2018-11-15 DIAGNOSIS — M63822 Disorders of muscle in diseases classified elsewhere, left upper arm: Secondary | ICD-10-CM | POA: Diagnosis not present

## 2018-11-15 DIAGNOSIS — M63821 Disorders of muscle in diseases classified elsewhere, right upper arm: Secondary | ICD-10-CM | POA: Diagnosis not present

## 2018-11-15 DIAGNOSIS — M25511 Pain in right shoulder: Secondary | ICD-10-CM | POA: Diagnosis not present

## 2018-11-15 DIAGNOSIS — M4046 Postural lordosis, lumbar region: Secondary | ICD-10-CM | POA: Diagnosis not present

## 2018-11-15 DIAGNOSIS — M7541 Impingement syndrome of right shoulder: Secondary | ICD-10-CM | POA: Diagnosis not present

## 2018-11-15 DIAGNOSIS — M7542 Impingement syndrome of left shoulder: Secondary | ICD-10-CM | POA: Diagnosis not present

## 2018-11-15 NOTE — Telephone Encounter (Signed)
Note reviewed.  Melissa's concerns are for supraspinatus tear and impingement.  Xray will not show this adequately and I recommend an MRI of his right shoulder to evaluate more thoroughly.  Please contact Dr. Lyda Kalata and find out if he would like this done?  If he's agreeable, I will place the order.  Also, check if he has any claustrophobia problems or implanted metal?

## 2018-11-17 DIAGNOSIS — M63822 Disorders of muscle in diseases classified elsewhere, left upper arm: Secondary | ICD-10-CM | POA: Diagnosis not present

## 2018-11-17 DIAGNOSIS — M25512 Pain in left shoulder: Secondary | ICD-10-CM | POA: Diagnosis not present

## 2018-11-17 DIAGNOSIS — M25511 Pain in right shoulder: Secondary | ICD-10-CM | POA: Diagnosis not present

## 2018-11-17 DIAGNOSIS — M63821 Disorders of muscle in diseases classified elsewhere, right upper arm: Secondary | ICD-10-CM | POA: Diagnosis not present

## 2018-11-17 DIAGNOSIS — M7541 Impingement syndrome of right shoulder: Secondary | ICD-10-CM | POA: Diagnosis not present

## 2018-11-17 DIAGNOSIS — M4046 Postural lordosis, lumbar region: Secondary | ICD-10-CM | POA: Diagnosis not present

## 2018-11-17 NOTE — Telephone Encounter (Signed)
Spoke with patient and he would like to proceed with the MRI, he states it's no hurry and he is not claustrophobic.

## 2018-11-18 NOTE — Telephone Encounter (Signed)
Order for MRI has been placed.

## 2018-11-22 DIAGNOSIS — M25511 Pain in right shoulder: Secondary | ICD-10-CM | POA: Diagnosis not present

## 2018-11-22 DIAGNOSIS — M63821 Disorders of muscle in diseases classified elsewhere, right upper arm: Secondary | ICD-10-CM | POA: Diagnosis not present

## 2018-11-22 DIAGNOSIS — M25512 Pain in left shoulder: Secondary | ICD-10-CM | POA: Diagnosis not present

## 2018-11-22 DIAGNOSIS — M4046 Postural lordosis, lumbar region: Secondary | ICD-10-CM | POA: Diagnosis not present

## 2018-11-22 DIAGNOSIS — M63822 Disorders of muscle in diseases classified elsewhere, left upper arm: Secondary | ICD-10-CM | POA: Diagnosis not present

## 2018-11-22 DIAGNOSIS — M7541 Impingement syndrome of right shoulder: Secondary | ICD-10-CM | POA: Diagnosis not present

## 2018-11-24 ENCOUNTER — Other Ambulatory Visit: Payer: Self-pay | Admitting: Internal Medicine

## 2018-11-24 DIAGNOSIS — E782 Mixed hyperlipidemia: Secondary | ICD-10-CM

## 2018-11-25 DIAGNOSIS — H25811 Combined forms of age-related cataract, right eye: Secondary | ICD-10-CM | POA: Diagnosis not present

## 2018-11-25 DIAGNOSIS — H2511 Age-related nuclear cataract, right eye: Secondary | ICD-10-CM | POA: Diagnosis not present

## 2018-11-26 ENCOUNTER — Other Ambulatory Visit: Payer: Self-pay

## 2018-11-26 ENCOUNTER — Encounter: Payer: Self-pay | Admitting: Internal Medicine

## 2018-11-26 ENCOUNTER — Encounter: Payer: Self-pay | Admitting: *Deleted

## 2018-11-26 ENCOUNTER — Ambulatory Visit
Admission: RE | Admit: 2018-11-26 | Discharge: 2018-11-26 | Disposition: A | Discharge status: Home / Self Care | Payer: Medicare Other | Source: Ambulatory Visit | Attending: Internal Medicine | Admitting: Internal Medicine

## 2018-11-26 DIAGNOSIS — M25511 Pain in right shoulder: Principal | ICD-10-CM

## 2018-11-26 DIAGNOSIS — M75121 Complete rotator cuff tear or rupture of right shoulder, not specified as traumatic: Secondary | ICD-10-CM | POA: Diagnosis not present

## 2018-11-26 DIAGNOSIS — G8929 Other chronic pain: Secondary | ICD-10-CM

## 2018-12-02 ENCOUNTER — Encounter: Payer: Self-pay | Admitting: Internal Medicine

## 2018-12-03 DIAGNOSIS — Z961 Presence of intraocular lens: Secondary | ICD-10-CM | POA: Diagnosis not present

## 2018-12-06 DIAGNOSIS — M63821 Disorders of muscle in diseases classified elsewhere, right upper arm: Secondary | ICD-10-CM | POA: Diagnosis not present

## 2018-12-06 DIAGNOSIS — M25511 Pain in right shoulder: Secondary | ICD-10-CM | POA: Diagnosis not present

## 2018-12-06 DIAGNOSIS — M25512 Pain in left shoulder: Secondary | ICD-10-CM | POA: Diagnosis not present

## 2018-12-06 DIAGNOSIS — M7541 Impingement syndrome of right shoulder: Secondary | ICD-10-CM | POA: Diagnosis not present

## 2018-12-06 DIAGNOSIS — M4046 Postural lordosis, lumbar region: Secondary | ICD-10-CM | POA: Diagnosis not present

## 2018-12-06 DIAGNOSIS — M63822 Disorders of muscle in diseases classified elsewhere, left upper arm: Secondary | ICD-10-CM | POA: Diagnosis not present

## 2018-12-08 ENCOUNTER — Non-Acute Institutional Stay: Payer: Medicare Other | Admitting: Internal Medicine

## 2018-12-08 ENCOUNTER — Encounter: Payer: Self-pay | Admitting: Internal Medicine

## 2018-12-08 VITALS — BP 120/70 | HR 81 | Temp 98.6°F | Wt 170.0 lb

## 2018-12-08 DIAGNOSIS — M7541 Impingement syndrome of right shoulder: Secondary | ICD-10-CM | POA: Diagnosis not present

## 2018-12-08 DIAGNOSIS — M7540 Impingement syndrome of unspecified shoulder: Secondary | ICD-10-CM

## 2018-12-08 DIAGNOSIS — M7551 Bursitis of right shoulder: Secondary | ICD-10-CM | POA: Diagnosis not present

## 2018-12-08 MED ORDER — METHYLPREDNISOLONE ACETATE 40 MG/ML IJ SUSP
40.0000 mg | Freq: Once | INTRAMUSCULAR | Status: AC
  Administered 2018-12-08: 40 mg via INTRA_ARTICULAR

## 2018-12-08 NOTE — Progress Notes (Signed)
Location:  [place] of Service:  Clinic (12)  Provider: Makayleigh Poliquin L. Mariea Clonts, D.O., C.M.D.  Goals of Care:  Advanced Directives 01/13/2018  Does Patient Have a Medical Advance Directive? Yes  Type of Advance Directive [place]  Does patient want to make changes to medical advance directive? No - Patient declined  Copy of [place] in Chart? Yes  Pre-existing out of facility DNR order (yellow form or pink MOST form) -     Chief Complaint  Patient presents with  . Acute Visit    right shoulder injection    HPI: Patient is a 78 y.o. male seen today for an acute visit for right shoulder pain and plans for injection.  He has been going to OT for his right shoulder since September (now 6 mos).  He's made a lot of progress and is able to abduct his arm, but it's painful during the middle aspect and does better to flex it anteriorly than to directly abduct it.  He can hold it up.  He has tenderness over the biceps tendon insertion and his subacromial bursa.  His MRI results were reviewed and we communicated about them via mychart and I spoke with his OT about them.  He's been using biofreeze on the shoulder.    He mentions that he had one of his cataract surgeries with Dr. Ellie Lunch, but now the surgical center is closed due to covid so the other eye will delayed.  He notes that walls are actually white not tan/cream like he thought they were.  Past Medical History:  Diagnosis Date  . Allergy    seasonal  . Arthritis    knees  . GERD (gastroesophageal reflux disease)   . Glaucoma   . Hemorrhoids   . Hyperlipidemia   . RBBB     Past Surgical History:  Procedure Laterality Date  . CARDIAC CATHETERIZATION  1993  . COLONOSCOPY  2002; 2007  . POLYPECTOMY      Allergies  Allergen Reactions  . Black Pepper [Piper] Other (See Comments)    bloating  . Ciprocinonide [Fluocinolone]     bleeding  . Nsaids     History of bleeding     Outpatient Encounter Medications as of 12/08/2018  Medication Sig  . Ascorbic Acid (VITAMIN C) 1000 MG tablet Take 1,000 mg by mouth daily as needed (onset of cold symptoms). Reported on 01/02/2016  . b complex vitamins tablet Take 1 tablet by mouth daily.  . bimatoprost (LUMIGAN) 0.01 % SOLN Place 1 drop into both eyes at bedtime.   . cetirizine (ZYRTEC) 10 MG tablet Take 10 mg by mouth daily as needed.   . Cholecalciferol (VITAMIN D) 2000 UNITS CAPS Take 1 capsule (2,000 Units total) by mouth daily.  . folic acid (FOLVITE) 703 MCG tablet Take 800 mcg by mouth daily.   . Glucosamine-Chondroit-Vit C-Mn (GLUCOSAMINE CHONDR 1500 COMPLX PO) Take 1,500 mg by mouth daily.   Marland Kitchen omeprazole (PRILOSEC) 40 MG capsule TAKE ONE CAPSULE BY MOUTH DAILY  . rosuvastatin (CRESTOR) 5 MG tablet TAKE ONE TABLET BY MOUTH DAILY  . timolol (TIMOPTIC) 0.5 % ophthalmic solution One drop in both eyes once a day in morning for glaucoma  . [EXPIRED] methylPREDNISolone acetate (DEPO-MEDROL) injection 40 mg    No facility-administered encounter medications on file as of 12/08/2018.     Review of Systems:  Review of Systems  Constitutional: Negative for chills and fever.  Respiratory: Negative for cough.  Musculoskeletal: Positive for joint pain and myalgias.    Health Maintenance  Topic Date Due  . COLONOSCOPY  01/23/2019  . TETANUS/TDAP  12/09/2023  . INFLUENZA VACCINE  Completed  . PNA vac Low Risk Adult  Completed    Physical Exam: Vitals:   12/08/18 1006  BP: 120/70  Pulse: 81  Temp: 98.6 F (37 C)  TempSrc: Oral  SpO2: 97%  Weight: 170 lb (77.1 kg)   Body mass index is 25.85 kg/m. Physical Exam Vitals signs reviewed.  Constitutional:      Appearance: Normal appearance.  HENT:     Head: Normocephalic and atraumatic.  Eyes:     Comments: glasses  Musculoskeletal:        General: Swelling and tenderness present. No deformity.     Comments:  able to abduct his right arm, but it's painful  during the middle aspect and does better to flex it anteriorly than to directly abduct it.  He can hold it up.  He has tenderness over the biceps tendon insertion and his subacromial bursa.   Neurological:     General: No focal deficit present.     Mental Status: He is alert and oriented to person, place, and time.     Labs reviewed: Basic Metabolic Panel: Recent Labs    01/05/18 0600  NA 142  K 4.3  BUN 11  CREATININE 1.1   Liver Function Tests: No results for input(s): AST, ALT, ALKPHOS, BILITOT, PROT, ALBUMIN in the last 8760 hours. No results for input(s): LIPASE, AMYLASE in the last 8760 hours. No results for input(s): AMMONIA in the last 8760 hours. CBC: Recent Labs    01/05/18 0600  WBC 6.7  HGB 13.6  HCT 42  PLT 275   Lipid Panel: Recent Labs    01/05/18 0600 07/13/18  CHOL 182 156  HDL 48 49  LDLCALC 104 80  TRIG 148 137   Lab Results  Component Value Date   HGBA1C 6.3 07/13/2018    Procedures since last visit: Mr Shoulder Right Wo Contrast  Result Date: 11/26/2018 CLINICAL DATA:  Lateral right shoulder pain and limited range of motion for 6 months. No injury. EXAM: MRI OF THE RIGHT SHOULDER WITHOUT CONTRAST TECHNIQUE: Multiplanar, multisequence MR imaging of the shoulder was performed. No intravenous contrast was administered. COMPARISON:  None. FINDINGS: The patient has rotator cuff tendon scratch none. Rotator cuff: The patient has rotator cuff tendinopathy. Partial width, full-thickness tear of the far lateral supraspinatus measures approximately 0.5 cm from front to back and is just posterior to the leading edge of the tendon. No retraction. The rotator cuff is otherwise intact. Muscles: No atrophy. There is a T1 hyperintense lesion in the teres minor which measures approximately 2.4 cm AP x 1.2 cm craniocaudal x at least 6 cm transverse. The lesion is incompletely imaged. Visualized portion of the lesion demonstrates complete signal dropout on T2 weighted  imaging. Biceps long head: Intrasubstance increased T2 signal is seen in both the intra and extra-articular segments consistent with tendinosis. There is some fluid and synovial thickening about the tendon in the bicipital groove. Acromioclavicular Joint: Advanced degenerative change is present with marrow edema about the joint. Type 2 acromion. There is some fluid in the subacromial/subdeltoid bursa. Mild subacromial spurring also noted. Glenohumeral Joint: Negative. Labrum:  Intact.  Buford complex noted. Bones:  No fracture or worrisome lesion. Other: None. IMPRESSION: Rotator cuff tendinopathy with a 0.5 cm from front to back full-thickness tear of the far lateral  supraspinatus. No retraction or atrophy. Tendinopathy of both the intra and extra-articular long head of biceps. Bulky acromioclavicular osteoarthritis with marrow edema about the joint. Subacromial/subdeltoid bursitis. 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 Electronically Signed   By: Inge Rise M.D.   On: 11/26/2018 14:02    Assessment/Plan 1. Rotator cuff impingement syndrome of right shoulder - has failed conservative measures for relief of pain -verbal consent was obtained  - right shoulder was cleansed with betadine swabs on lateral aspect x 2 -it was then sprayed with topical bupivicaine spray until skin turned white -then his subacromial bursa was injected with 1cc (40mg ) of depo-medrol and 2cc of 1% lidocaine - methylPREDNISolone acetate (DEPO-MEDROL) injection 40 mg -pt did not immediate relief of discomfort and abducted his arm w/ minimal pain -advised, if he has some pain from the injection itself, he may take tylenol for relief  2. Subacromial bursitis of right shoulder joint -injection performed as above - methylPREDNISolone acetate (DEPO-MEDROL) injection 40 mg  Labs/tests ordered:  No orders of the defined types were placed in this  encounter.  Next appt:  01/19/2019  Gracia Saggese L. Oskar Cretella, D.O. [facility] [address], [place] [address] Cell Phone (Mon-Fri 8am-5pm):  [phone] On Call:  [phone] & follow prompts after 5pm & weekends Office Phone:  [phone] Office Fax:  [phone]

## 2018-12-16 DIAGNOSIS — M7542 Impingement syndrome of left shoulder: Secondary | ICD-10-CM | POA: Diagnosis not present

## 2018-12-16 DIAGNOSIS — M63822 Disorders of muscle in diseases classified elsewhere, left upper arm: Secondary | ICD-10-CM | POA: Diagnosis not present

## 2018-12-16 DIAGNOSIS — M25512 Pain in left shoulder: Secondary | ICD-10-CM | POA: Diagnosis not present

## 2018-12-16 DIAGNOSIS — M63821 Disorders of muscle in diseases classified elsewhere, right upper arm: Secondary | ICD-10-CM | POA: Diagnosis not present

## 2018-12-16 DIAGNOSIS — M4046 Postural lordosis, lumbar region: Secondary | ICD-10-CM | POA: Diagnosis not present

## 2018-12-16 DIAGNOSIS — M25511 Pain in right shoulder: Secondary | ICD-10-CM | POA: Diagnosis not present

## 2018-12-16 DIAGNOSIS — M7541 Impingement syndrome of right shoulder: Secondary | ICD-10-CM | POA: Diagnosis not present

## 2019-01-11 DIAGNOSIS — D519 Vitamin B12 deficiency anemia, unspecified: Secondary | ICD-10-CM | POA: Diagnosis not present

## 2019-01-11 DIAGNOSIS — E538 Deficiency of other specified B group vitamins: Secondary | ICD-10-CM | POA: Diagnosis not present

## 2019-01-11 DIAGNOSIS — D649 Anemia, unspecified: Secondary | ICD-10-CM | POA: Diagnosis not present

## 2019-01-11 LAB — VITAMIN B12: Vitamin B-12: 2000

## 2019-01-19 ENCOUNTER — Encounter: Payer: Self-pay | Admitting: Internal Medicine

## 2019-01-19 ENCOUNTER — Other Ambulatory Visit: Payer: Self-pay

## 2019-01-19 ENCOUNTER — Non-Acute Institutional Stay: Payer: Medicare Other | Admitting: Internal Medicine

## 2019-01-19 VITALS — BP 140/80 | HR 82 | Temp 98.8°F | Ht 68.0 in | Wt 171.0 lb

## 2019-01-19 DIAGNOSIS — T50905A Adverse effect of unspecified drugs, medicaments and biological substances, initial encounter: Secondary | ICD-10-CM | POA: Diagnosis not present

## 2019-01-19 DIAGNOSIS — Z860100 Personal history of colon polyps, unspecified: Secondary | ICD-10-CM

## 2019-01-19 DIAGNOSIS — H25012 Cortical age-related cataract, left eye: Secondary | ICD-10-CM | POA: Diagnosis not present

## 2019-01-19 DIAGNOSIS — R142 Eructation: Secondary | ICD-10-CM | POA: Diagnosis not present

## 2019-01-19 DIAGNOSIS — E782 Mixed hyperlipidemia: Secondary | ICD-10-CM

## 2019-01-19 DIAGNOSIS — D519 Vitamin B12 deficiency anemia, unspecified: Secondary | ICD-10-CM | POA: Diagnosis not present

## 2019-01-19 DIAGNOSIS — R739 Hyperglycemia, unspecified: Secondary | ICD-10-CM | POA: Diagnosis not present

## 2019-01-19 DIAGNOSIS — M7541 Impingement syndrome of right shoulder: Secondary | ICD-10-CM

## 2019-01-19 DIAGNOSIS — K219 Gastro-esophageal reflux disease without esophagitis: Secondary | ICD-10-CM | POA: Diagnosis not present

## 2019-01-19 NOTE — Progress Notes (Signed)
Location:   [place] of Service:  Clinic (12)  Provider: Yadhira Mckneely L. Mariea Clonts, D.O., C.M.D.  Goals of Care:  Advanced Directives 01/19/2019  Does Patient Have a Medical Advance Directive? Yes  Type of Advance Directive [place]  Does patient want to make changes to medical advance directive? No - Patient declined  Copy of [place] in Chart? Yes - validated most recent copy scanned in chart (See row information)  Pre-existing out of facility DNR order (yellow form or pink MOST form) -     Chief Complaint  Patient presents with  . Medical Management of Chronic Issues    6MTH FOLLOW-UP    HPI: Patient is a 78 y.o. male seen today for medical management of chronic diseases.    Shoulder is doing much better.  There is still some pain, but his ROM is considerably better.  He uses his left arm to do some things.  Not having therapy anymore.  Shot did help a lot.  The bone spurs are an issue.    He is walking more than he had been--he's up to 30 minutes a day--walked here this morning.  He remains down about 5 lbs over several months.  He will have two scotches before bed.  After second scotch, he belches for 10 minutes and then it stops.  It does not happen after breakfast, lunch or dinner.  It's after he watches the news at 11pm and has the scotches.    He's still waiting on his second cataract surgery (left still pending).      He had his B12 test done.  He'd been having the shooting pain in the left leg.  Rarely he has some of the neuropathic discomfort in the right leg.    He was meant to do a cscope this year.  Had prior polyp.  He's actually eating less.  He's not hungry.  They send everything.  They split up the food over meals sometimes.  Past Medical History:  Diagnosis Date  . Allergy    seasonal  . Arthritis    knees  . GERD (gastroesophageal reflux disease)   . Glaucoma   . Hemorrhoids   . Hyperlipidemia   . RBBB     Past Surgical History:  Procedure Laterality Date  . CARDIAC CATHETERIZATION  1993  . COLONOSCOPY  2002; 2007  . POLYPECTOMY      Allergies  Allergen Reactions  . Black Pepper [Piper] Other (See Comments)    bloating  . Ciprocinonide [Fluocinolone]     bleeding  . Nsaids     History of bleeding    Outpatient Encounter Medications as of 01/19/2019  Medication Sig  . Ascorbic Acid (VITAMIN C) 1000 MG tablet Take 1,000 mg by mouth daily as needed (onset of cold symptoms). Reported on 01/02/2016  . b complex vitamins tablet Take 1 tablet by mouth daily.  . bimatoprost (LUMIGAN) 0.01 % SOLN Place 1 drop into both eyes at bedtime.   . cetirizine (ZYRTEC) 10 MG tablet Take 10 mg by mouth daily as needed.   . Cholecalciferol (VITAMIN D) 2000 UNITS CAPS Take 1 capsule (2,000 Units total) by mouth daily.  . cyanocobalamin 2000 MCG tablet Take 2,000 mcg by mouth daily.  . folic acid (FOLVITE) 119 MCG tablet Take 800 mcg by mouth daily.   . Glucosamine-Chondroit-Vit C-Mn (GLUCOSAMINE CHONDR 1500 COMPLX PO) Take 1,500 mg by mouth daily.   Marland Kitchen omeprazole (PRILOSEC) 40 MG  capsule TAKE ONE CAPSULE BY MOUTH DAILY  . rosuvastatin (CRESTOR) 5 MG tablet TAKE ONE TABLET BY MOUTH DAILY  . timolol (TIMOPTIC) 0.5 % ophthalmic solution One drop in both eyes once a day in morning for glaucoma   No facility-administered encounter medications on file as of 01/19/2019.     Review of Systems:  Review of Systems  Constitutional: Negative for chills, fever and malaise/fatigue.  HENT: Negative for congestion and hearing loss.   Eyes: Negative for blurred vision.  Respiratory: Negative for cough and shortness of breath.   Cardiovascular: Negative for chest pain, palpitations and leg swelling.  Gastrointestinal: Negative for abdominal pain, blood in stool, constipation, diarrhea and melena.  Genitourinary: Negative for dysuria.  Musculoskeletal: Positive for joint pain. Negative for falls.  Skin: Negative for  itching and rash.  Neurological: Negative for dizziness and loss of consciousness.  Endo/Heme/Allergies: Bruises/bleeds easily.  Psychiatric/Behavioral: Negative for depression and memory loss. The patient is not nervous/anxious and does not have insomnia.     Health Maintenance  Topic Date Due  . COLONOSCOPY  01/23/2019  . INFLUENZA VACCINE  04/16/2019  . TETANUS/TDAP  12/09/2023  . PNA vac Low Risk Adult  Completed    Physical Exam: Vitals:   01/19/19 0935  BP: 140/80  Pulse: 82  Temp: 98.8 F (37.1 C)  TempSrc: Oral  SpO2: 98%  Weight: 171 lb (77.6 kg)  Height: 5\' 8"  (1.727 m)   Body mass index is 26 kg/m. Physical Exam Vitals signs reviewed.  Constitutional:      Appearance: Normal appearance.  HENT:     Head: Normocephalic and atraumatic.  Eyes:     Comments: glasses  Pulmonary:     Effort: Pulmonary effort is normal.  Musculoskeletal:     Comments: Limited ROM of right shoulder   Neurological:     Mental Status: He is alert.  Psychiatric:        Mood and Affect: Mood normal.        Behavior: Behavior normal.        Thought Content: Thought content normal.        Judgment: Judgment normal.     Labs reviewed: Basic Metabolic Panel: No results for input(s): NA, K, CL, CO2, GLUCOSE, BUN, CREATININE, CALCIUM, MG, PHOS, TSH in the last 8760 hours. Liver Function Tests: No results for input(s): AST, ALT, ALKPHOS, BILITOT, PROT, ALBUMIN in the last 8760 hours. No results for input(s): LIPASE, AMYLASE in the last 8760 hours. No results for input(s): AMMONIA in the last 8760 hours. CBC: No results for input(s): WBC, NEUTROABS, HGB, HCT, MCV, PLT in the last 8760 hours. Lipid Panel: Recent Labs    07/13/18  CHOL 156  HDL 49  LDLCALC 80  TRIG 137   Lab Results  Component Value Date   HGBA1C 6.3 07/13/2018   B12 >2000 on 4/28  Assessment/Plan 1. Belching symptom -seems related to his GERD and some relaxation of muscles after two scotches  -recommended just one scotch  2. Gastroesophageal reflux disease, esophagitis presence not specified -cont PPI  3. Drug-induced vitamin B12 deficiency anemia -decrease B12 supplement to 1/2 tablet of the 2023mcg  4. 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 Personal history of colonic polyps -is due for cscope this year--he's ok with doing it if he's contacted, but also ok not to (will be a while until routine screenings done now with covid pandemic)  6. Hyperglycemia -f/u hba1c before we meet next -cont  exercise and diet  7. Mixed hyperlipidemia -cont statin therapy and f/u labs before we meet -cont exercise and diet  8. Rotator cuff impingement syndrome of right shoulder -much better after therapy and his shot, cont exercises and modified use, can repeat injection if needed at 3-4 mos  Labs/tests ordered:  Cbc with diff, cmp with gfr, flp, hba1c, b12  Next appt:  6 mos with fasting labs before  Annaliza Zia L. Dominiq Fontaine, D.O. [facility] [address], [place] [address] Cell Phone (Mon-Fri 8am-5pm):  [phone] On Call:  [phone] & follow prompts after 5pm & weekends Office Phone:  [phone] Office Fax:  [phone]

## 2019-02-24 DIAGNOSIS — H25812 Combined forms of age-related cataract, left eye: Secondary | ICD-10-CM | POA: Diagnosis not present

## 2019-02-24 DIAGNOSIS — H2512 Age-related nuclear cataract, left eye: Secondary | ICD-10-CM | POA: Diagnosis not present

## 2019-03-04 ENCOUNTER — Encounter: Payer: Self-pay | Admitting: Gastroenterology

## 2019-04-26 NOTE — Progress Notes (Signed)
Prescreened pt for 8-12 appt

## 2019-04-27 ENCOUNTER — Ambulatory Visit (INDEPENDENT_AMBULATORY_CARE_PROVIDER_SITE_OTHER): Payer: Medicare Other | Admitting: Gastroenterology

## 2019-04-27 ENCOUNTER — Encounter: Payer: Self-pay | Admitting: Gastroenterology

## 2019-04-27 VITALS — Ht 67.0 in | Wt 165.0 lb

## 2019-04-27 DIAGNOSIS — K219 Gastro-esophageal reflux disease without esophagitis: Secondary | ICD-10-CM

## 2019-04-27 DIAGNOSIS — Z86010 Personal history of colonic polyps: Secondary | ICD-10-CM | POA: Diagnosis not present

## 2019-04-27 MED ORDER — SUPREP BOWEL PREP KIT 17.5-3.13-1.6 GM/177ML PO SOLN: ORAL | 0 refills | Status: DC

## 2019-04-27 NOTE — Progress Notes (Signed)
Virtual Visit via Video Note  I connected with Ian Rowe on 04/27/19 at  9:20 AM EDT by a video enabled telemedicine application and verified that I am speaking with the correct person using two identifiers.  I discussed the limitations of evaluation and management by telemedicine and the availability of in person appointments. The patient expressed understanding and agreed to proceed.  THIS ENCOUNTER IS A VIRTUAL VISIT DUE TO COVID-19 - PATIENT WAS NOT SEEN IN THE OFFICE. PATIENT HAS CONSENTED TO VIRTUAL VISIT / TELEMEDICINE VISIT   Location of patient: home Location of provider: office Persons participating: myself, patient   HPI :  78 y/o male here for a follow up visit. He has a history of multiple adenomatous polyps, last colonoscopy in May 2017 at which point in time he had 3 polyps removed, largest 83mm tubulovillous adenoma resected. That was the last time I have seen him.  He has not had any problems with his bowels. He has regular stools for the most part, occasional constipation, usually dietary related. No blood in the stools that he notes. No abdominal pains. No FH of colon cancer. No cardiopulmonary symptoms.   He takes omeprazole 40mg . He reports it controls his symptoms pretty well. He takes it PRN, once or twice daily. He previously had nocturnal symptoms. No dysphagia. No FH of esophageal cancer. He does have some belching, he thinks he swallows air and has to belch at times. No prior EGDs that he is aware of. He has been on reflux regimen for years.   Colonoscopy 01/23/2016 - One 4 mm polyp in the cecum, removed with a cold snare. Resected and retrieved. - One diminutive polyp in the ascending colon, removed with a cold biopsy forceps. Resected and retrieved. - One 6 mm polyp in the ascending colon, removed with a hot snare. Resected and retrieved. - One 20 mm polyp in the sigmoid colon, removed with a hot snare. Resected and retrieved. - Non-bleeding internal  hemorrhoids. - The examination was otherwise normal.    Past Medical History:  Diagnosis Date  . Allergy    seasonal  . Arthritis    knees  . GERD (gastroesophageal reflux disease)   . Glaucoma   . Hemorrhoids   . Hyperlipidemia   . RBBB      Past Surgical History:  Procedure Laterality Date  . CARDIAC CATHETERIZATION  1993  . COLONOSCOPY  2002; 2007  . POLYPECTOMY     Family History  Problem Relation Age of Onset  . Alzheimer's disease Mother   . Heart disease Father   . Colon cancer Neg Hx   . Colon polyps Neg Hx    Social History   Tobacco Use  . Smoking status: Never Smoker  . Smokeless tobacco: Never Used  Substance Use Topics  . Alcohol use: Yes    Alcohol/week: 10.0 standard drinks    Types: 7 Shots of liquor, 3 Glasses of wine per week    Comment: 2 scotch at bedtime nightly, occ glass of wine   . Drug use: No   Current Outpatient Medications  Medication Sig Dispense Refill  . Ascorbic Acid (VITAMIN C) 1000 MG tablet Take 1,000 mg by mouth daily as needed (onset of cold symptoms). Reported on 01/02/2016    . b complex vitamins tablet Take 1 tablet by mouth daily.    . bimatoprost (LUMIGAN) 0.01 % SOLN Place 1 drop into both eyes at bedtime.     . cetirizine (ZYRTEC) 10 MG tablet  Take 10 mg by mouth daily as needed.     . Cholecalciferol (VITAMIN D) 2000 UNITS CAPS Take 1 capsule (2,000 Units total) by mouth daily. 30 capsule 3  . cyanocobalamin 2000 MCG tablet Take 1,000 mcg by mouth daily.     . folic acid (FOLVITE) 169 MCG tablet Take 800 mcg by mouth daily.     . Glucosamine-Chondroit-Vit C-Mn (GLUCOSAMINE CHONDR 1500 COMPLX PO) Take 1,500 mg by mouth daily.     Marland Kitchen omeprazole (PRILOSEC) 40 MG capsule TAKE ONE CAPSULE BY MOUTH DAILY 90 capsule 3  . rosuvastatin (CRESTOR) 5 MG tablet TAKE ONE TABLET BY MOUTH DAILY 90 tablet 3  . timolol (TIMOPTIC) 0.5 % ophthalmic solution One drop in both eyes once a day in morning for glaucoma     No current  facility-administered medications for this visit.    Allergies  Allergen Reactions  . Black Pepper [Piper] Other (See Comments)    bloating  . Ciprocinonide [Fluocinolone]     bleeding  . Nsaids     History of bleeding     Review of Systems: All systems reviewed and negative except where noted in HPI.   Lab Results  Component Value Date   WBC 6.7 01/05/2018   HGB 13.6 01/05/2018   HCT 42 01/05/2018   MCV 88.8 12/08/2013   PLT 275 01/05/2018   Lab Results  Component Value Date   CREATININE 1.1 01/05/2018   BUN 11 01/05/2018   NA 142 01/05/2018   K 4.3 01/05/2018   CL 98 12/08/2013   CO2 27 12/08/2013    Lab Results  Component Value Date   ALT 16 06/30/2017   AST 21 06/30/2017   ALKPHOS 74 06/30/2017     Physical Exam: Ht 5\' 7"  (1.702 m)   Wt 165 lb (74.8 kg)   BMI 25.84 kg/m  BP 145/70, temp 98.2 Constitutional: Pleasant,well-developed, male in no acute distress. Psychiatric: Normal mood and affect. Behavior is normal.   ASSESSMENT AND PLAN: 78 year old male here for reassessment of the following:  History of colon polyps -patient had an advanced tubulovillous adenoma removed over 3 years ago.  We discussed whether or not he wants any additional surveillance colonoscopies given his age.  He is currently very healthy for his age, he does not have any significant comorbidities.  A reasonable life expectancy could be another 10 years.  I discussed risks and benefits of colonoscopy with him, versus the risks/benefits of no further colonoscopy exams.  We had a lengthy discussion about this, given he is feeling well for his age he wanted to continue with surveillance and do this one last colonoscopy prior to stopping surveillance.  I think that is reasonable given his last exam showed a high risk polyp.  He was referred to the scheduler, further recommendations pending results.  GERD - longstanding symptoms of pyrosis and belching.  His symptoms are fairly well  controlled on a chronic PPI although does have some periodic belching.  He has never had a prior EGD for Barrett's screening.  I discussed what Barrett's is with him and risk/ benefits of upper endoscopy.  He has no alarm symptoms.  Following discussion of risks and benefits of EGD he declined given he is continuing to feel well on his present regimen.  I did discuss the long-term risks and benefits of chronic PPI use and recommend the lowest daily dose needed to control symptoms.  He agreed  Bier Cellar, MD [facility]

## 2019-04-27 NOTE — Patient Instructions (Addendum)
If you are age 80 or older, your body mass index should be between 23-30. Your Body mass index is 25.84 kg/m. If this is out of the aforementioned range listed, please consider follow up with your Primary Care Provider.  If you are age 8 or younger, your body mass index should be between 19-25. Your Body mass index is 25.84 kg/m. If this is out of the aformentioned range listed, please consider follow up with your Primary Care Provider.   To help prevent the possible spread of infection to our patients, communities, and staff; we will be implementing the following measures:  As of now we are not allowing any visitors/family members to accompany you to any upcoming appointments with [facility]. If you have any concerns about this please contact our office to discuss prior to the appointment.    You have been scheduled for a colonoscopy. Please follow written instructions given to you at your visit today.  Please pick up your prep supplies at the pharmacy within the next 1-3 days. If you use inhalers (even only as needed), please bring them with you on the day of your procedure. Your physician has requested that you go to www.startemmi.com and enter the access code given to you at your visit today. This web site gives a general overview about your procedure. However, you should still follow specific instructions given to you by our office regarding your preparation for the procedure.   Thank you for entrusting me with your care and for choosing [facility], Dr. Miltona Cellar

## 2019-05-19 ENCOUNTER — Telehealth: Payer: Self-pay

## 2019-05-19 NOTE — Telephone Encounter (Signed)
Covid-19 screening questions   Do you now or have you had a fever in the last 14 days? NO   Do you have any respiratory symptoms of shortness of breath or cough now or in the last 14 days? NO  Do you have any family members or close contacts with diagnosed or suspected Covid-19 in the past 14 days? NO  Have you been tested for Covid-19 and found to be positive? NO        

## 2019-05-20 ENCOUNTER — Ambulatory Visit (AMBULATORY_SURGERY_CENTER): Payer: Medicare Other | Admitting: Gastroenterology

## 2019-05-20 ENCOUNTER — Encounter: Payer: Self-pay | Admitting: Gastroenterology

## 2019-05-20 ENCOUNTER — Other Ambulatory Visit: Payer: Self-pay

## 2019-05-20 VITALS — BP 145/69 | HR 61 | Temp 98.3°F | Resp 21 | Ht 67.0 in | Wt 165.0 lb

## 2019-05-20 DIAGNOSIS — Z86010 Personal history of colonic polyps: Secondary | ICD-10-CM | POA: Diagnosis not present

## 2019-05-20 DIAGNOSIS — D120 Benign neoplasm of cecum: Secondary | ICD-10-CM | POA: Diagnosis not present

## 2019-05-20 DIAGNOSIS — D123 Benign neoplasm of transverse colon: Secondary | ICD-10-CM

## 2019-05-20 MED ORDER — SODIUM CHLORIDE 0.9 % IV SOLN: 500.0000 mL | Freq: Once | INTRAVENOUS | Status: DC

## 2019-05-20 NOTE — Progress Notes (Signed)
Called to room to assist during endoscopic procedure.  Patient ID and intended procedure confirmed with present staff. Received instructions for my participation in the procedure from the performing physician.  

## 2019-05-20 NOTE — Op Note (Signed)
[facility] Patient Name: Ian Rowe Procedure Date: 05/20/2019 12:17 PM MRN: [id number] Endoscopist: Remo Lipps P. 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 The patient's tolerance of                            previous anesthesia was also reviewed. The risks                            and benefits of the procedure and the sedation                            options and risks were discussed with the patient.                            All questions were answered, and informed consent                            was obtained. Prior Anticoagulants: The patient has                            taken no previous anticoagulant or antiplatelet                            agents. ASA Grade Assessment: II - A patient with                            mild systemic disease. After reviewing the risks                            and benefits, the patient was deemed in  satisfactory condition to undergo the procedure.                           After obtaining informed consent, the colonoscope                            was passed under direct vision. Throughout the                            procedure, the patient's blood pressure, pulse, and                            oxygen saturations were monitored continuously. The                            Colonoscope was introduced through the  anus and                            advanced to the the cecum, identified by                            appendiceal orifice and ileocecal valve. The                            colonoscopy was performed without difficulty. The                            patient tolerated the procedure well. The quality                            of the bowel preparation was good. The ileocecal                            valve, appendiceal orifice, and rectum were                            photographed. Scope In: 12:22:12 PM Scope Out: 12:37:52 PM Scope Withdrawal Time: 0 hours 12 minutes 47 seconds  Total Procedure Duration: 0 hours 15 minutes 40 seconds  Findings:                 The perianal and digital rectal examinations were                            normal.                           A diminutive polyp was found in the cecum. The                            polyp was sessile. The polyp was removed with a                            cold snare. Resection and retrieval were complete.  A diminutive polyp was found in the transverse                            colon. The polyp was sessile. The polyp was removed                            with a cold snare. Resection and retrieval were                            complete.                           Internal hemorrhoids were found during                            retroflexion. The hemorrhoids were small.                           The exam was otherwise without abnormality. Complications:            No immediate complications. Estimated blood loss:                            Minimal. Estimated Blood Loss:     Estimated blood loss was minimal. Impression:               - One diminutive polyp in the cecum, removed with a                            cold snare. Resected and retrieved.                           - One diminutive polyp in the transverse colon,                            removed with a cold snare. Resected and retrieved.                            - Internal hemorrhoids.                           - The examination was otherwise normal. Recommendation:           - Patient has a contact number available for                            emergencies. The signs and symptoms of potential                            delayed complications were discussed with the                            patient. Return to normal activities tomorrow.                            Written discharge instructions were provided to  the                            patient.                           - Resume previous diet.                           - Continue present medications.                           - Await pathology results.                           - No further colonoscopies for surveillance are                            needed due to age Carlota Raspberry. Iyonnah Ferrante, MD 05/20/2019 12:41:24 PM This report has been signed electronically.

## 2019-05-20 NOTE — Patient Instructions (Signed)
Handouts given : Polyps and hemorrhoids    YOU HAD AN ENDOSCOPIC PROCEDURE TODAY AT [facility]:   Refer to the procedure report that was given to you for any specific questions about what was found during the examination.  If the procedure report does not answer your questions, please call your gastroenterologist to clarify.  If you requested that your care partner not be given the details of your procedure findings, then the procedure report has been included in a sealed envelope for you to review at your convenience later.  YOU SHOULD EXPECT: Some feelings of bloating in the abdomen. Passage of more gas than usual.  Walking can help get rid of the air that was put into your GI tract during the procedure and reduce the bloating. If you had a lower endoscopy (such as a colonoscopy or flexible sigmoidoscopy) you may notice spotting of blood in your stool or on the toilet paper. If you underwent a bowel prep for your procedure, you may not have a normal bowel movement for a few days.  Please Note:  You might notice some irritation and congestion in your nose or some drainage.  This is from the oxygen used during your procedure.  There is no need for concern and it should clear up in a day or so.  SYMPTOMS TO REPORT IMMEDIATELY:   Following lower endoscopy (colonoscopy or flexible sigmoidoscopy):  Excessive amounts of blood in the stool  Significant tenderness or worsening of abdominal pains  Swelling of the abdomen that is new, acute  Fever of 100F or higher   For urgent or emergent issues, a gastroenterologist can be reached at any hour by calling [phone].   DIET:  We do recommend a small meal at first, but then you may proceed to your regular diet.  Drink plenty of fluids but you should avoid alcoholic beverages for 24 hours.  ACTIVITY:  You should plan to take it easy for the rest of today and you should NOT DRIVE or use heavy machinery until tomorrow (because of  the sedation medicines used during the test).    FOLLOW UP: Our staff will call the number listed on your records 48-72 hours following your procedure to check on you and address any questions or concerns that you may have regarding the information given to you following your procedure. If we do not reach you, we will leave a message.  We will attempt to reach you two times.  During this call, we will ask if you have developed any symptoms of COVID 19. If you develop any symptoms (ie: fever, flu-like symptoms, shortness of breath, cough etc.) before then, please call [phone].  If you test positive for Covid 19 in the 2 weeks post procedure, please call and report this information to [place].    If any biopsies were taken you will be contacted by phone or by letter within the next 1-3 weeks.  Please call us at [phone] if you have not heard about the biopsies in 3 weeks.    SIGNATURES/CONFIDENTIALITY: You and/or your care partner have signed paperwork which will be entered into your electronic medical record.  These signatures attest to the fact that that the information above on your After Visit Summary has been reviewed and is understood.  Full responsibility of the confidentiality of this discharge information lies with you and/or your care-partner.

## 2019-05-20 NOTE — Progress Notes (Signed)
Temperature taken by J.B., VS taken by C.W. 

## 2019-05-20 NOTE — Progress Notes (Signed)
PT taken to PACU. Monitors in place. VSS. Report given to RN. 

## 2019-05-20 NOTE — Progress Notes (Signed)
Pt's states no medical or surgical changes since previsit or office visit. 

## 2019-05-25 ENCOUNTER — Telehealth: Payer: Self-pay

## 2019-05-25 ENCOUNTER — Telehealth: Payer: Self-pay | Admitting: *Deleted

## 2019-05-25 NOTE — Telephone Encounter (Signed)
  Follow up Call-  Call back number 05/20/2019  Post procedure Call Back phone  # [phone]  Permission to leave phone message Yes  Some recent data might be hidden    LMOM to call back with any questions or concerns or if he has developed any symptoms of the COVID 19

## 2019-05-25 NOTE — Telephone Encounter (Signed)
Pt returned call and said he is doing good since his procedure

## 2019-05-25 NOTE — Telephone Encounter (Signed)
No answer, left message to call back later today, B.Jonquil Stubbe RN. 

## 2019-06-16 DIAGNOSIS — Z23 Encounter for immunization: Secondary | ICD-10-CM | POA: Diagnosis not present

## 2019-07-07 ENCOUNTER — Ambulatory Visit (INDEPENDENT_AMBULATORY_CARE_PROVIDER_SITE_OTHER): Payer: Medicare Other | Admitting: Nurse Practitioner

## 2019-07-07 ENCOUNTER — Other Ambulatory Visit: Payer: Self-pay

## 2019-07-07 VITALS — BP 136/92 | HR 76

## 2019-07-07 DIAGNOSIS — Z0000 Encounter for general adult medical examination without abnormal findings: Secondary | ICD-10-CM | POA: Diagnosis not present

## 2019-07-07 NOTE — Patient Instructions (Addendum)
Ian Rowe , Thank you for taking time to come for your Medicare Wellness Visit. I appreciate your ongoing commitment to your health goals. Please review the following plan we discussed and let me know if I can assist you in the future.   Screening recommendations/referrals: Colonoscopy up to date Recommended yearly ophthalmology/optometry visit for glaucoma screening and checkup Recommended yearly dental visit for hygiene and checkup  Vaccinations: Influenza vaccine up to date Pneumococcal vaccine up to date Tdap vaccine up to date Shingles vaccine up to date    Advanced directives: please bring to next visit with Dr Mariea Clonts so they can be on file   Conditions/risks identified: cardiovascular risk due to family hx, keep up walking and cholesterol reducing medication.   Next appointment: 1 year  Preventive Care 76 Years and Older, Male Preventive care refers to lifestyle choices and visits with your health care provider that can promote health and wellness. What does preventive care include?  A yearly physical exam. This is also called an annual well check.  Dental exams once or twice a year.  Routine eye exams. Ask your health care provider how often you should have your eyes checked.  Personal lifestyle choices, including:  Daily care of your teeth and gums.  Regular physical activity.  Eating a healthy diet.  Avoiding tobacco and drug use.  Limiting alcohol use.  Practicing safe sex.  Taking low doses of aspirin every day.  Taking vitamin and mineral supplements as recommended by your health care provider. What happens during an annual well check? The services and screenings done by your health care provider during your annual well check will depend on your age, overall health, lifestyle risk factors, and family history of disease. Counseling  Your health care provider may ask you questions about your:  Alcohol use.  Tobacco use.  Drug use.  Emotional  well-being.  Home and relationship well-being.  Sexual activity.  Eating habits.  History of falls.  Memory and ability to understand (cognition).  Work and work Statistician. Screening  You may have the following tests or measurements:  Height, weight, and BMI.  Blood pressure.  Lipid and cholesterol levels. These may be checked every 5 years, or more frequently if you are over 27 years old.  Skin check.  Lung cancer screening. You may have this screening every year starting at age 83 if you have a 30-pack-year history of smoking and currently smoke or have quit within the past 15 years.  Fecal occult blood test (FOBT) of the stool. You may have this test every year starting at age 77.  Flexible sigmoidoscopy or colonoscopy. You may have a sigmoidoscopy every 5 years or a colonoscopy every 10 years starting at age 61.  Prostate cancer screening. Recommendations will vary depending on your family history and other risks.  Hepatitis C blood test.  Hepatitis B blood test.  Sexually transmitted disease (STD) testing.  Diabetes screening. This is done by checking your blood sugar (glucose) after you have not eaten for a while (fasting). You may have this done every 1-3 years.  Abdominal aortic aneurysm (AAA) screening. You may need this if you are a current or former smoker.  Osteoporosis. You may be screened starting at age 25 if you are at high risk. Talk with your health care provider about your test results, treatment options, and if necessary, the need for more tests. Vaccines  Your health care provider may recommend certain vaccines, such as:  Influenza vaccine. This is recommended  every year.  Tetanus, diphtheria, and acellular pertussis (Tdap, Td) vaccine. You may need a Td booster every 10 years.  Zoster vaccine. You may need this after age 22.  Pneumococcal 13-valent conjugate (PCV13) vaccine. One dose is recommended after age 56.  Pneumococcal  polysaccharide (PPSV23) vaccine. One dose is recommended after age 61. Talk to your health care provider about which screenings and vaccines you need and how often you need them. This information is not intended to replace advice given to you by your health care provider. Make sure you discuss any questions you have with your health care provider. Document Released: 09/28/2015 Document Revised: 05/21/2016 Document Reviewed: 07/03/2015 Elsevier Interactive Patient Education  2017 [place] Prevention in the Home Falls can cause injuries. They can happen to people of all ages. There are many things you can do to make your home safe and to help prevent falls. What can I do on the outside of my home?  Regularly fix the edges of walkways and driveways and fix any cracks.  Remove anything that might make you trip as you walk through a door, such as a raised step or threshold.  Trim any bushes or trees on the path to your home.  Use bright outdoor lighting.  Clear any walking paths of anything that might make someone trip, such as rocks or tools.  Regularly check to see if handrails are loose or broken. Make sure that both sides of any steps have handrails.  Any raised decks and porches should have guardrails on the edges.  Have any leaves, snow, or ice cleared regularly.  Use sand or salt on walking paths during winter.  Clean up any spills in your garage right away. This includes oil or grease spills. What can I do in the bathroom?  Use night lights.  Install grab bars by the toilet and in the tub and shower. Do not use towel bars as grab bars.  Use non-skid mats or decals in the tub or shower.  If you need to sit down in the shower, use a plastic, non-slip stool.  Keep the floor dry. Clean up any water that spills on the floor as soon as it happens.  Remove soap buildup in the tub or shower regularly.  Attach bath mats securely with double-sided non-slip rug tape.   Do not have throw rugs and other things on the floor that can make you trip. What can I do in the bedroom?  Use night lights.  Make sure that you have a light by your bed that is easy to reach.  Do not use any sheets or blankets that are too big for your bed. They should not hang down onto the floor.  Have a firm chair that has side arms. You can use this for support while you get dressed.  Do not have throw rugs and other things on the floor that can make you trip. What can I do in the kitchen?  Clean up any spills right away.  Avoid walking on wet floors.  Keep items that you use a lot in easy-to-reach places.  If you need to reach something above you, use a strong step stool that has a grab bar.  Keep electrical cords out of the way.  Do not use floor polish or wax that makes floors slippery. If you must use wax, use non-skid floor wax.  Do not have throw rugs and other things on the floor that can make you trip. What  can I do with my stairs?  Do not leave any items on the stairs.  Make sure that there are handrails on both sides of the stairs and use them. Fix handrails that are broken or loose. Make sure that handrails are as long as the stairways.  Check any carpeting to make sure that it is firmly attached to the stairs. Fix any carpet that is loose or worn.  Avoid having throw rugs at the top or bottom of the stairs. If you do have throw rugs, attach them to the floor with carpet tape.  Make sure that you have a light switch at the top of the stairs and the bottom of the stairs. If you do not have them, ask someone to add them for you. What else can I do to help prevent falls?  Wear shoes that:  Do not have high heels.  Have rubber bottoms.  Are comfortable and fit you well.  Are closed at the toe. Do not wear sandals.  If you use a stepladder:  Make sure that it is fully opened. Do not climb a closed stepladder.  Make sure that both sides of the  stepladder are locked into place.  Ask someone to hold it for you, if possible.  Clearly mark and make sure that you can see:  Any grab bars or handrails.  First and last steps.  Where the edge of each step is.  Use tools that help you move around (mobility aids) if they are needed. These include:  Canes.  Walkers.  Scooters.  Crutches.  Turn on the lights when you go into a dark area. Replace any light bulbs as soon as they burn out.  Set up your furniture so you have a clear path. Avoid moving your furniture around.  If any of your floors are uneven, fix them.  If there are any pets around you, be aware of where they are.  Review your medicines with your doctor. Some medicines can make you feel dizzy. This can increase your chance of falling. Ask your doctor what other things that you can do to help prevent falls. This information is not intended to replace advice given to you by your health care provider. Make sure you discuss any questions you have with your health care provider. Document Released: 06/28/2009 Document Revised: 02/07/2016 Document Reviewed: 10/06/2014 Elsevier Interactive Patient Education  2017 [employer].

## 2019-07-07 NOTE — Progress Notes (Signed)
Subjective:   Ian Rowe is a 78 y.o. male who presents for Medicare Annual/Subsequent preventive examination.  Review of Systems:   Cardiac Risk Factors include: advanced age (>44men, >66 women);male gender;dyslipidemia;family history of premature cardiovascular disease     Objective:    Vitals: BP (!) 136/92   Pulse 76   There is no height or weight on file to calculate BMI.  Advanced Directives 07/07/2019 07/07/2019 01/19/2019 01/13/2018 01/12/2018 07/08/2017 12/31/2016  Does Patient Have a Medical Advance Directive? Yes Yes Yes Yes - Yes Yes  Type of Advance Directive [place];Living will [place];Living will Healthcare Power of [place] of [place] of Attorney  Does patient want to make changes to medical advance directive? No - Patient declined No - Patient declined No - Patient declined No - Patient declined No - Patient declined - -  Copy of [place] in Chart? No - copy requested No - copy requested Yes - validated most recent copy scanned in chart (See row information) Yes Yes Yes Yes  Pre-existing out of facility DNR order (yellow form or pink MOST form) - - - - - - -    Tobacco Social History   Tobacco Use  Smoking Status Never Smoker  Smokeless Tobacco Never Used     Counseling given: Not Answered   Clinical Intake:  Pre-visit preparation completed: Yes  Pain : No/denies pain     BMI - recorded: 25.84 Nutritional Risks: None Diabetes: No  How often do you need to have someone help you when you read instructions, pamphlets, or other written materials from your doctor or pharmacy?: 1 - Never What is the last grade level you completed in school?: phD  Interpreter Needed?: No     Past Medical History:  Diagnosis Date  . Allergy    seasonal  . Arthritis    knees  . GERD (gastroesophageal reflux disease)    . Glaucoma   . Hemorrhoids   . Hyperlipidemia   . RBBB    Past Surgical History:  Procedure Laterality Date  . CARDIAC CATHETERIZATION  1993  . COLONOSCOPY  2002; 2007  . POLYPECTOMY     Family History  Problem Relation Age of Onset  . Alzheimer's disease Mother   . Heart disease Father   . Colon cancer Neg Hx   . Colon polyps Neg Hx   . Esophageal cancer Neg Hx   . Stomach cancer Neg Hx   . Rectal cancer Neg Hx    Social History   Socioeconomic History  . Marital status: Married    Spouse name: Mardene Celeste  . Number of children: 2  . Years of education: Not on file  . Highest education level: Not on file  Occupational History  . Occupation: Retired Clinical biochemist  Social Needs  . Financial resource strain: Not hard at all  . Food insecurity    Worry: Never true    Inability: Never true  . Transportation needs    Medical: No    Non-medical: No  Tobacco Use  . Smoking status: Never Smoker  . Smokeless tobacco: Never Used  Substance and Sexual Activity  . Alcohol use: Yes    Alcohol/week: 10.0 standard drinks    Types: 7 Shots of liquor, 3 Glasses of wine per week    Comment: 2 scotch at bedtime nightly, occ glass of wine   . Drug use: No  .  Sexual activity: Not on file  Lifestyle  . Physical activity    Days per week: 5 days    Minutes per session: 30 min  . Stress: Only a little  Relationships  . Social connections    Talks on phone: More than three times a week    Gets together: More than three times a week    Attends religious service: Never    Active member of club or organization: Yes    Attends meetings of clubs or organizations: More than 4 times per year    Relationship status: Married  Other Topics Concern  . Not on file  Social History Narrative   Moved to [place] 2014.   Married   Never smoked   Alcohol - scotch or wine nightly   Walks 150 minutes per week.    POA    Outpatient Encounter Medications as of  07/07/2019  Medication Sig  . Ascorbic Acid (VITAMIN C) 1000 MG tablet Take 1,000 mg by mouth daily as needed (onset of cold symptoms). Reported on 01/02/2016  . b complex vitamins tablet Take 1 tablet by mouth daily.  . bimatoprost (LUMIGAN) 0.01 % SOLN Place 1 drop into both eyes at bedtime.   . cetirizine (ZYRTEC) 10 MG tablet Take 10 mg by mouth daily as needed.   . Cholecalciferol (VITAMIN D) 2000 UNITS CAPS Take 1 capsule (2,000 Units total) by mouth daily.  . cyanocobalamin 2000 MCG tablet Take 1,000 mcg by mouth daily.   . folic acid (FOLVITE) Q000111Q MCG tablet Take 800 mcg by mouth daily.   . Glucosamine-Chondroit-Vit C-Mn (GLUCOSAMINE CHONDR 1500 COMPLX PO) Take 1,500 mg by mouth daily.   Marland Kitchen omeprazole (PRILOSEC) 40 MG capsule TAKE ONE CAPSULE BY MOUTH DAILY  . rosuvastatin (CRESTOR) 5 MG tablet TAKE ONE TABLET BY MOUTH DAILY  . timolol (TIMOPTIC) 0.5 % ophthalmic solution One drop in both eyes once a day in morning for glaucoma   No facility-administered encounter medications on file as of 07/07/2019.     Activities of Daily Living In your present state of health, do you have any difficulty performing the following activities: 07/07/2019  Hearing? N  Vision? N  Difficulty concentrating or making decisions? N  Walking or climbing stairs? N  Dressing or bathing? N  Doing errands, shopping? N  Preparing Food and eating ? N  Using the Toilet? N  In the past six months, have you accidently leaked urine? N  Do you have problems with loss of bowel control? N  Managing your Medications? N  Managing your Finances? N  Housekeeping or managing your Housekeeping? N  Some recent data might be hidden    Patient Care Team: Gayland Curry, DO as PCP - General (Geriatric Medicine) Community, [facility]   Assessment:   This is a routine wellness examination for Ian Rowe.  Exercise Activities and Dietary recommendations Current Exercise Habits: Home exercise routine, Type of  exercise: walking, Time (Minutes): 30, Frequency (Times/Week): 7, Weekly Exercise (Minutes/Week): 210, Intensity: Moderate  Goals    . Exercise 210 min/wk Moderate Activity     30 mins/ 7 days a week of walkin       Fall Risk Fall Risk  07/07/2019 01/19/2019 12/08/2018 07/21/2018 01/13/2018  Falls in the past year? 0 0 0 0 No  Number falls in past yr: - 0 0 0 -  Injury with Fall? - 0 0 0 -   Is the patient's home free of loose throw rugs in  walkways, pet beds, electrical cords, etc?   yes      Grab bars in the bathroom? yes      Handrails on the stairs?   no stairs      Adequate lighting?   yes  Timed Get Up and Go Performed: na  Depression Screen PHQ 2/9 Scores 07/07/2019 01/19/2019 12/08/2018 07/21/2018  PHQ - 2 Score 0 0 0 0    Cognitive Function MMSE - Mini Mental State Exam 01/12/2018 12/31/2016 06/27/2015  Orientation to time 5 5 5   Orientation to Place 5 5 5   Registration 3 3 3   Attention/ Calculation 5 5 5   Recall 3 3 3   Language- name 2 objects 2 2 2   Language- repeat 1 1 1   Language- follow 3 step command 3 3 3   Language- read & follow direction 1 1 1   Write a sentence 1 1 1   Copy design 1 1 1   Total score 30 30 30      6CIT Screen 07/07/2019  What Year? 0 points  What month? 0 points  What time? 0 points  Count back from 20 0 points  Months in reverse 0 points  Repeat phrase 0 points  Total Score 0    Immunization History  Administered Date(s) Administered  . Influenza Whole 06/15/2012, 06/15/2013  . Influenza, High Dose Seasonal PF 06/19/2016  . Influenza, Quadrivalent, Recombinant, Inj, Pf 06/16/2019  . Influenza,inj,Quad PF,6+ Mos 06/15/2018  . Influenza-Unspecified 06/14/2014, 06/21/2015, 06/30/2017  . Pneumococcal Conjugate-13 10/31/2014  . Pneumococcal Polysaccharide-23 09/16/2011  . Tdap 12/08/2013  . Zoster Recombinat (Shingrix) 08/25/2017, 11/02/2017    Qualifies for Shingles Vaccine? Yes- up to date   Screening Tests Health Maintenance   Topic Date Due  . TETANUS/TDAP  12/09/2023  . INFLUENZA VACCINE  Completed  . PNA vac Low Risk Adult  Completed   Cancer Screenings: Lung: Low Dose CT Chest recommended if Age 4-80 years, 30 pack-year currently smoking OR have quit w/in 15years. Patient does not qualify. Colorectal: up to date- last in July   Additional Screenings:  Hepatitis C Screening: na      Plan:     I have personally reviewed and noted the following in the patient's chart:   . Medical and social history . Use of alcohol, tobacco or illicit drugs  . Current medications and supplements . Functional ability and status . Nutritional status . Physical activity . Advanced directives . List of other physicians . Hospitalizations, surgeries, and ER visits in previous 12 months . Vitals . Screenings to include cognitive, depression, and falls . Referrals and appointments  In addition, I have reviewed and discussed with patient certain preventive protocols, quality metrics, and best practice recommendations. A written personalized care plan for preventive services as well as general preventive health recommendations were provided to patient.     Lauree Chandler, NP  07/07/2019

## 2019-07-07 NOTE — Progress Notes (Signed)
This service is provided via telemedicine  No vital signs collected/recorded due to the encounter was a telemedicine visit.   Location of patient (ex: home, work):  Home  Patient consents to a telephone visit:  Yes  Location of the provider (ex: office, home):  [facility]  Name of any referring provider:  Hollace Kinnier, DO  Names of all persons participating in the telemedicine service and their role in the encounter:  Bonney Leitz, [place]; Sherrie Mustache, NP; patient  Time spent on call:  8.32 minutes, CMA time only

## 2019-07-08 DIAGNOSIS — H401132 Primary open-angle glaucoma, bilateral, moderate stage: Secondary | ICD-10-CM | POA: Diagnosis not present

## 2019-07-21 ENCOUNTER — Encounter: Payer: Self-pay | Admitting: Internal Medicine

## 2019-07-21 DIAGNOSIS — D519 Vitamin B12 deficiency anemia, unspecified: Secondary | ICD-10-CM | POA: Diagnosis not present

## 2019-07-21 DIAGNOSIS — E782 Mixed hyperlipidemia: Secondary | ICD-10-CM | POA: Diagnosis not present

## 2019-07-21 DIAGNOSIS — E119 Type 2 diabetes mellitus without complications: Secondary | ICD-10-CM | POA: Diagnosis not present

## 2019-07-21 DIAGNOSIS — D649 Anemia, unspecified: Secondary | ICD-10-CM | POA: Diagnosis not present

## 2019-07-21 DIAGNOSIS — R739 Hyperglycemia, unspecified: Secondary | ICD-10-CM | POA: Diagnosis not present

## 2019-07-21 DIAGNOSIS — R748 Abnormal levels of other serum enzymes: Secondary | ICD-10-CM | POA: Diagnosis not present

## 2019-07-21 DIAGNOSIS — E785 Hyperlipidemia, unspecified: Secondary | ICD-10-CM | POA: Diagnosis not present

## 2019-07-21 LAB — LIPID PANEL
Cholesterol: 162 (ref 0–200)
HDL: 53 (ref 35–70)
LDL Cholesterol: 75
Triglycerides: 170 — AB (ref 40–160)

## 2019-07-21 LAB — VITAMIN B12: Vitamin B-12: 831

## 2019-07-21 LAB — HEMOGLOBIN A1C: Hemoglobin A1C: 6.2

## 2019-07-21 LAB — BASIC METABOLIC PANEL
BUN: 13 (ref 4–21)
Creatinine: 1.1 (ref 0.6–1.3)
Glucose: 114
Potassium: 4.9 (ref 3.4–5.3)
Sodium: 141 (ref 137–147)

## 2019-07-21 LAB — CBC AND DIFFERENTIAL
HCT: 39 — AB (ref 41–53)
Hemoglobin: 13 — AB (ref 13.5–17.5)
WBC: 8.3

## 2019-07-21 LAB — HEPATIC FUNCTION PANEL
ALT: 15 (ref 10–40)
AST: 25 (ref 14–40)
Alkaline Phosphatase: 77 (ref 25–125)

## 2019-07-27 ENCOUNTER — Encounter: Payer: Self-pay | Admitting: *Deleted

## 2019-07-27 ENCOUNTER — Encounter: Payer: Self-pay | Admitting: Internal Medicine

## 2019-07-27 ENCOUNTER — Other Ambulatory Visit: Payer: Self-pay

## 2019-07-27 ENCOUNTER — Non-Acute Institutional Stay: Payer: Medicare Other | Admitting: Internal Medicine

## 2019-07-27 VITALS — BP 138/86 | HR 85 | Temp 98.5°F | Ht 67.0 in | Wt 175.6 lb

## 2019-07-27 DIAGNOSIS — Z789 Other specified health status: Secondary | ICD-10-CM | POA: Diagnosis not present

## 2019-07-27 DIAGNOSIS — G8929 Other chronic pain: Secondary | ICD-10-CM

## 2019-07-27 DIAGNOSIS — E782 Mixed hyperlipidemia: Secondary | ICD-10-CM

## 2019-07-27 DIAGNOSIS — T50905A Adverse effect of unspecified drugs, medicaments and biological substances, initial encounter: Secondary | ICD-10-CM

## 2019-07-27 DIAGNOSIS — R739 Hyperglycemia, unspecified: Secondary | ICD-10-CM | POA: Diagnosis not present

## 2019-07-27 DIAGNOSIS — D519 Vitamin B12 deficiency anemia, unspecified: Secondary | ICD-10-CM

## 2019-07-27 DIAGNOSIS — R142 Eructation: Secondary | ICD-10-CM | POA: Diagnosis not present

## 2019-07-27 DIAGNOSIS — M25511 Pain in right shoulder: Secondary | ICD-10-CM

## 2019-07-27 NOTE — Progress Notes (Signed)
Location:  Occupational psychologist of Service:  Clinic (12)  Provider: Martell Mcfadyen L. Mariea Clonts, D.O., C.M.D.  Code Status: FULL CODE Goals of Care:  Advanced Directives 07/27/2019  Does Patient Have a Medical Advance Directive? Yes  Type of Advance Directive [place]  Does patient want to make changes to medical advance directive? No - Patient declined  Copy of [place] in Chart? Yes - validated most recent copy scanned in chart (See row information)  Pre-existing out of facility DNR order (yellow form or pink MOST form) -     Chief Complaint  Patient presents with  . Medical Management of Chronic Issues    6 Month Follow up    HPI: Patient is a 78 y.o. male seen today for medical management of chronic diseases.    He has brought his HCPOA.  He wishes to be resuscitated at this stage of his life if he were to have a cardiopulmonary arrest.    He is now 65 and doing fairly well.  He remains active.    He continues to belch at night after he has his scotch.  It goes on for 10-15 mins.  Then it stops.  He did discuss it with Dr. Havery Moros who did not express concern.  His wife sleeps through it. Not in distress.  No pain.  It's annoying.  Has two to two and a half scotch drinks nightly.  Occasional aches or pains here or there.  He limits stair climbing due to his knee pain.  Has maintenance exercises for his shoulders--has a touch of pain in the right shoulder but can do everything he needs to do and plans to avoid taking up golf.    Gets sugar-free desserts two to three times per week.  Eats regular desserts the remaining nights.  Sugar average is stable in low 6s.    Rare tingle for 30s in feet but no other neuropathic symptoms with normal b12 levels.  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    Past Medical History:  Diagnosis Date  . Allergy    seasonal  . Arthritis    knees  . GERD (gastroesophageal reflux disease)   . Glaucoma   . Hemorrhoids   . Hyperlipidemia   . RBBB     Past Surgical History:  Procedure Laterality Date  . CARDIAC CATHETERIZATION  1993  . COLONOSCOPY  2002; 2007  . POLYPECTOMY      Allergies  Allergen Reactions  . Black Pepper [[place]] Other (See Comments)    bloating  . Ciprocinonide [Fluocinolone]     bleeding  . Nsaids     History of bleeding    Outpatient Encounter Medications as of 07/27/2019  Medication Sig  . Ascorbic Acid (VITAMIN C) 1000 MG tablet Take 1,000 mg by mouth daily as needed (onset of cold symptoms). Reported on 01/02/2016  . b complex vitamins tablet Take 1 tablet by mouth daily.  . bimatoprost (LUMIGAN) 0.01 % SOLN Place 1 drop into both eyes at bedtime.   . cetirizine (ZYRTEC) 10 MG tablet Take 10 mg by mouth daily as needed.   . Cholecalciferol (VITAMIN D) 2000 UNITS CAPS Take 1 capsule (2,000 Units total) by mouth daily.  . cyanocobalamin 2000 MCG tablet Take 1,000 mcg by mouth daily.   . folic acid (FOLVITE) Q000111Q MCG tablet Take 800 mcg by mouth daily.   Marland Kitchen  Glucosamine-Chondroit-Vit C-Mn (GLUCOSAMINE CHONDR 1500 COMPLX PO) Take 1,500 mg by mouth daily.   Marland Kitchen omeprazole (PRILOSEC) 40 MG capsule TAKE ONE CAPSULE BY MOUTH DAILY  . rosuvastatin (CRESTOR) 5 MG tablet TAKE ONE TABLET BY MOUTH DAILY  . timolol (TIMOPTIC) 0.5 % ophthalmic solution One drop in both eyes once a day in morning for glaucoma   No facility-administered encounter medications on file as of 07/27/2019.     Review of Systems:  Review of Systems  Constitutional: Negative for chills, fever and malaise/fatigue.  HENT: Negative for hearing loss.   Eyes: Negative for blurred vision.       Glasses  Respiratory: Negative for cough and shortness of breath.   Cardiovascular: Negative for chest pain, palpitations and leg swelling.  Gastrointestinal: Negative for abdominal pain, constipation, diarrhea, nausea  and vomiting.       Belching at night after scotch, then resolves and not an issue otherwise  Genitourinary: Negative for dysuria.  Musculoskeletal: Positive for joint pain. Negative for falls and myalgias.  Skin: Negative for itching and rash.  Neurological: Negative for dizziness and loss of consciousness.  Endo/Heme/Allergies: Does not bruise/bleed easily.  Psychiatric/Behavioral: Negative for depression and memory loss. The patient is not nervous/anxious and does not have insomnia.        Worries about his wife; so glad they are at [place] Date Due  . Samul Dada  12/09/2023  . INFLUENZA VACCINE  Completed  . PNA vac Low Risk Adult  Completed    Physical Exam: Vitals:   07/27/19 0927 07/27/19 1027  BP: (!) 154/82 138/86  Pulse: 85   Temp: 98.5 F (36.9 C)   TempSrc: Oral   SpO2: 97%   Weight: 175 lb 9.6 oz (79.7 kg)   Height: 5\' 7"  (1.702 m)    Body mass index is 27.5 kg/m. Physical Exam Vitals signs reviewed.  Constitutional:      General: He is not in acute distress.    Appearance: Normal appearance. He is diaphoretic. He is not toxic-appearing.  HENT:     Head: Normocephalic and atraumatic.  Eyes:     Comments: glasses  Cardiovascular:     Rate and Rhythm: Normal rate and regular rhythm.     Pulses: Normal pulses.     Heart sounds: Normal heart sounds.  Pulmonary:     Effort: Pulmonary effort is normal.     Breath sounds: Normal breath sounds. No wheezing, rhonchi or rales.  Musculoskeletal: Normal range of motion.     Right lower leg: No edema.     Left lower leg: No edema.  Skin:    General: Skin is warm.  Neurological:     General: No focal deficit present.     Mental Status: He is alert and oriented to person, place, and time.  Psychiatric:        Mood and Affect: Mood normal.        Behavior: Behavior normal.        Thought Content: Thought content normal.        Judgment: Judgment normal.     Labs  reviewed: Basic Metabolic Panel: Recent Labs    07/21/19  NA 141  K 4.9  BUN 13  CREATININE 1.1   Liver Function Tests: Recent Labs    07/21/19  AST 25  ALT 15  ALKPHOS 77   No results for input(s): LIPASE, AMYLASE in the last 8760 hours. No results for input(s): AMMONIA in  the last 8760 hours. CBC: Recent Labs    07/21/19  WBC 8.3  HGB 13.0*  HCT 39*   Lipid Panel: Recent Labs    07/21/19  CHOL 162  HDL 53  LDLCALC 75  TRIG 170*   Lab Results  Component Value Date   HGBA1C 6.2 07/21/2019    Assessment/Plan 1. Drug-induced vitamin B12 deficiency anemia -has very mild anemia -b12 level satisfactory with 1069mcg daily--cont this  2. Belching symptom -related to reflux, but odd that it's short-lived nightly and resolves itself  -has seen GI and they were not concerned about this and it's not painful for him, just annoying  3. Hyperglycemia -hba1c stable in lower 6 range for a long time, avoid excessive sweets and starches, says he can cut down on scotch, but has not really done so for any prolonged time  4. Mixed hyperlipidemia -TG slightly high, but LDL satisfactory, does like his sweets and scotch which affect this   5. Chronic right shoulder pain -rarely bothersome, good ROM now -he's able to do what he needs to do  6. Full code status - Full code--reviewed today with him -he brought his HCPOA which we will get scanned in vynca   Labs/tests ordered:  * No order type specified * Next appt:  Visit date not found  Harley Fitzwater L. Kadian Barcellos, D.O. [facility] [address], [place] [address] Cell Phone (Mon-Fri 8am-5pm):  [phone] On Call:  [phone] & follow prompts after 5pm & weekends Office Phone:  [phone] Office Fax:  [phone]

## 2019-09-16 HISTORY — PX: CATARACT EXTRACTION W/ INTRAOCULAR LENS  IMPLANT, BILATERAL: SHX1307

## 2019-09-29 DIAGNOSIS — Z23 Encounter for immunization: Secondary | ICD-10-CM | POA: Diagnosis not present

## 2019-10-06 ENCOUNTER — Encounter: Payer: Self-pay | Admitting: Internal Medicine

## 2019-10-26 DIAGNOSIS — Z23 Encounter for immunization: Secondary | ICD-10-CM | POA: Diagnosis not present

## 2019-10-27 ENCOUNTER — Encounter: Payer: Self-pay | Admitting: Internal Medicine

## 2019-10-31 ENCOUNTER — Other Ambulatory Visit: Payer: Self-pay | Admitting: Internal Medicine

## 2019-10-31 MED ORDER — OMEPRAZOLE 40 MG PO CPDR: 40.0000 mg | DELAYED_RELEASE_CAPSULE | Freq: Two times a day (BID) | ORAL | 3 refills | Status: DC

## 2019-11-08 ENCOUNTER — Other Ambulatory Visit: Payer: Self-pay | Admitting: Internal Medicine

## 2019-11-08 NOTE — Telephone Encounter (Signed)
prescription was filled on 10/31/19

## 2019-11-09 ENCOUNTER — Telehealth: Payer: Self-pay | Admitting: Internal Medicine

## 2019-11-09 DIAGNOSIS — K219 Gastro-esophageal reflux disease without esophagitis: Secondary | ICD-10-CM

## 2019-11-09 DIAGNOSIS — R142 Eructation: Secondary | ICD-10-CM

## 2019-11-09 NOTE — Telephone Encounter (Signed)
Will place GI referral at patient request.

## 2019-11-10 ENCOUNTER — Other Ambulatory Visit: Payer: Self-pay | Admitting: Internal Medicine

## 2019-11-10 ENCOUNTER — Encounter: Payer: Self-pay | Admitting: Internal Medicine

## 2019-11-15 ENCOUNTER — Other Ambulatory Visit: Payer: Self-pay | Admitting: Internal Medicine

## 2019-11-15 DIAGNOSIS — E782 Mixed hyperlipidemia: Secondary | ICD-10-CM

## 2019-11-17 ENCOUNTER — Encounter: Payer: Self-pay | Admitting: Physician Assistant

## 2019-11-17 ENCOUNTER — Ambulatory Visit (INDEPENDENT_AMBULATORY_CARE_PROVIDER_SITE_OTHER): Payer: Medicare Other | Admitting: Physician Assistant

## 2019-11-17 VITALS — BP 142/68 | HR 79 | Temp 97.2°F | Ht 67.0 in | Wt 176.0 lb

## 2019-11-17 DIAGNOSIS — R142 Eructation: Secondary | ICD-10-CM | POA: Diagnosis not present

## 2019-11-17 DIAGNOSIS — K219 Gastro-esophageal reflux disease without esophagitis: Secondary | ICD-10-CM | POA: Diagnosis not present

## 2019-11-17 NOTE — Patient Instructions (Signed)
If you are age 81 or older, your body mass index should be between 23-30. Your Body mass index is 27.57 kg/m. If this is out of the aforementioned range listed, please consider follow up with your Primary Care Provider.  If you are age 22 or younger, your body mass index should be between 19-25. Your Body mass index is 27.57 kg/m. If this is out of the aformentioned range listed, please consider follow up with your Primary Care Provider.   You have been scheduled for an endoscopy. Please follow written instructions given to you at your visit today. If you use inhalers (even only as needed), please bring them with you on the day of your procedure.  Reduce omeprazole to 40 mg by mouth in the morning

## 2019-11-17 NOTE — Progress Notes (Signed)
Subjective:    Patient ID: Ian Rowe, male    DOB: 1941-03-25, 79 y.o.   MRN: [id number]  HPI Ian Rowe is a pleasant 79 year old white male, established with Dr. Havery Rowe. He has history of chronic GERD, has never had previous EGD.  He comes in today with 6-month history of very frequent loud belching. He has been maintained on PPI therapy long-term, omeprazole 40 mg daily.  When he was seen by his PCP recently omeprazole was increased to twice daily but he has not noticed any change in his symptoms.  He was also given a trial of Gas-X without any improvement. Patient says symptoms are generally worse in the evenings especially if he drinks any alcohol and usually after eating.  He is also experiencing frequent belching when he is walking.  He denies any real heartburn or indigestion, no dysphagia or odynophagia.  He describes a recurrent pressure type sensation in his epigastrium/is a xiphoid area which is relieved by belching.  He says sometimes he can belch for 20 minutes straight. He has not had any recent medication changes, no changes in his diet.  He does feel that acidic foods make his symptoms worse.  He does not consume any carbonated beverages, no artificial sweeteners. He also complains of frequent abdominal bloating.  He does have some lactose intolerance which he developed at a late age and uses Lactaid milk.  Appetite has been fine, weight has been stable.  He does feel some upper abdominal fullness at times.  Colonoscopy had been done in September 2020 with removal of 2 diminutive polyps which were adenomatous and one hyperplastic.  Review of Systems Pertinent positive and negative review of systems were noted in the above HPI section.  All other review of systems was otherwise negative.  Outpatient Encounter Medications as of 11/17/2019  Medication Sig  . Ascorbic Acid (VITAMIN C) 1000 MG tablet Take 1,000 mg by mouth daily as needed (onset of cold symptoms). Reported on  01/02/2016  . b complex vitamins tablet Take 1 tablet by mouth daily.  . bimatoprost (LUMIGAN) 0.01 % SOLN Place 1 drop into both eyes at bedtime.   . cetirizine (ZYRTEC) 10 MG tablet Take 10 mg by mouth daily as needed.   . Cholecalciferol (VITAMIN D) 2000 UNITS CAPS Take 1 capsule (2,000 Units total) by mouth daily.  . cyanocobalamin 2000 MCG tablet Take 1,000 mcg by mouth daily.   . folic acid (FOLVITE) [id number] MCG tablet Take 800 mcg by mouth daily.   . Glucosamine-Chondroit-Vit C-Mn (GLUCOSAMINE CHONDR 1500 COMPLX PO) Take 1,500 mg by mouth daily.   Marland Kitchen omeprazole (PRILOSEC) 40 MG capsule TAKE ONE CAPSULE BY MOUTH DAILY  . rosuvastatin (CRESTOR) 5 MG tablet TAKE ONE TABLET BY MOUTH DAILY  . timolol (TIMOPTIC) 0.5 % ophthalmic solution One drop in both eyes once a day in morning for glaucoma   No facility-administered encounter medications on file as of 11/17/2019.   Allergies  Allergen Reactions  . Black Pepper [Piper] Other (See Comments)    bloating  . Ciprocinonide [Fluocinolone]     bleeding  . Ciprofloxacin Other (See Comments)    Bleeding-blood in stool   . Nsaids     History of bleeding   Patient Active Problem List   Diagnosis Date Noted  . Subacromial bursitis of right shoulder joint 12/08/2018  . Mild stage glaucoma 07/21/2018  . Acute left ankle pain 07/21/2018  . Osteoarthritis of fingers of both hands 07/21/2018  . Urinary problem in male  07/21/2018  . Hyperglycemia 12/31/2016  . Bilateral primary osteoarthritis of knee 12/31/2016  . Acute gout involving toe of left foot 12/31/2016  . Rotator cuff impingement syndrome 06/25/2016  . Osteoarthritis of left thumb 12/26/2015  . Special screening for malignant neoplasms, colon 12/26/2015  . Gastroesophageal reflux disease 10/31/2014  . Vitamin D deficiency 10/31/2014  . Laceration of forehead without complication [id number]  . Vasovagal syncope 12/19/2013  . RBBB 12/19/2013  . Sinusitis, acute 12/10/2013  . Personal  history of colonic polyps 04/10/2013  . GERD (gastroesophageal reflux disease)   . Mixed hyperlipidemia   . Hemorrhoids    Social History   Socioeconomic History  . Marital status: Married    Spouse name: Ian Rowe  . Number of children: 2  . Years of education: Not on file  . Highest education level: Not on file  Occupational History  . Occupation: Retired Clinical biochemist  Tobacco Use  . Smoking status: Never Smoker  . Smokeless tobacco: Never Used  Substance and Sexual Activity  . Alcohol use: Yes    Alcohol/week: 10.0 standard drinks    Types: 7 Shots of liquor, 3 Glasses of wine per week    Comment: 2 scotch at bedtime nightly, occ glass of wine   . Drug use: No  . Sexual activity: Not on file  Other Topics Concern  . Not on file  Social History Narrative   Moved to [place] 2014.   Married   Never smoked   Alcohol - scotch or wine nightly   Walks 150 minutes per week.    POA   Social Determinants of Health   Financial Resource Strain:   . Difficulty of Paying Living Expenses: Not on file  Food Insecurity:   . Worried About Charity fundraiser in the Last Year: Not on file  . Ran Out of Food in the Last Year: Not on file  Transportation Needs:   . Lack of Transportation (Medical): Not on file  . Lack of Transportation (Non-Medical): Not on file  Physical Activity:   . Days of Exercise per Week: Not on file  . Minutes of Exercise per Session: Not on file  Stress:   . Feeling of Stress : Not on file  Social Connections:   . Frequency of Communication with Friends and Family: Not on file  . Frequency of Social Gatherings with Friends and Family: Not on file  . Attends Religious Services: Not on file  . Active Member of Clubs or Organizations: Not on file  . Attends Archivist Meetings: Not on file  . Marital Status: Not on file  Intimate Partner Violence:   . Fear of Current or Ex-Partner: Not on file  . Emotionally Abused:  Not on file  . Physically Abused: Not on file  . Sexually Abused: Not on file    [name] family history includes Alzheimer's disease in his mother; Heart disease in his father.      Objective:    Vitals:   11/17/19 1334  BP: (!) 142/68  Pulse: 79  Temp: (!) 97.2 F (36.2 C)  SpO2: 99%    Physical Exam Well-developed well-nourished elderly white male in no acute distress.  Height, Weight, 176 BMI 27.5 belching frequently during interview  HEENT; nontraumatic normocephalic, EOMI, PE RR LA, sclera anicteric. Oropharynx; not examined Neck; supple, no JVD Cardiovascular; regular rate and rhythm with S1-S2, no murmur rub or gallop Pulmonary; Clear bilaterally Abdomen; soft, protuberant, somewhat hyperactive bowel sounds, no palpable  mass or hepatosplenomegaly,  Rectal; not done today Skin; benign exam, no jaundice rash or appreciable lesions Extremities; no clubbing cyanosis or edema skin warm and dry Neuro/Psych; alert and oriented x4, grossly nonfocal mood and affect appropriate      Assessment & Plan:   #69 79 year old white male with long history of chronic GERD maintained on PPI therapy who presents now with 39-month history of persistent frequent belching aggravated postprandially and by EtOH, generally occurring throughout the day. He describes a pressure type sensation in the epigastrium/subxiphoid relieved by belching. No abdominal pain though does feel bloated and full at times, no weight loss and no dysphagia.  Etiology is not clear, rule out poorly controlled GERD, large hiatal hernia, component of esophagitis, rule out gastric lesion, rule out gas bloat type picture, rule out aerophagia  #2 history of adenomatous and hyperplastic polyps-up-to-date with recent colonoscopy September 2020  Plan; as he has had no improvement in symptoms on twice daily PPI will go back to omeprazole 40 mg p.o. every morning Patient will be scheduled for upper endoscopy with Dr.  Havery Rowe.  Procedure was discussed in detail with the patient including indications risks and benefits, and he is agreeable to proceed. Patient has received Covid vaccine and will therefore not require testing. If EGD is unremarkable will need further abdominal imaging.  Athalene Kolle S Kyleigha Markert PA-C 11/17/2019   Cc: Gayland Curry, DO

## 2019-11-18 NOTE — Progress Notes (Signed)
Agree with assessment and plan as outlined.  

## 2019-11-23 ENCOUNTER — Ambulatory Visit (AMBULATORY_SURGERY_CENTER): Payer: Medicare Other | Admitting: Gastroenterology

## 2019-11-23 ENCOUNTER — Other Ambulatory Visit: Payer: Self-pay

## 2019-11-23 ENCOUNTER — Other Ambulatory Visit (INDEPENDENT_AMBULATORY_CARE_PROVIDER_SITE_OTHER): Payer: Medicare Other

## 2019-11-23 ENCOUNTER — Other Ambulatory Visit: Payer: Self-pay | Admitting: General Surgery

## 2019-11-23 ENCOUNTER — Encounter: Payer: Self-pay | Admitting: Gastroenterology

## 2019-11-23 ENCOUNTER — Other Ambulatory Visit: Payer: Self-pay | Admitting: Gastroenterology

## 2019-11-23 VITALS — BP 124/68 | HR 74 | Temp 95.9°F | Resp 19 | Ht 67.0 in | Wt 176.0 lb

## 2019-11-23 DIAGNOSIS — R142 Eructation: Secondary | ICD-10-CM | POA: Diagnosis not present

## 2019-11-23 DIAGNOSIS — K317 Polyp of stomach and duodenum: Secondary | ICD-10-CM

## 2019-11-23 DIAGNOSIS — K219 Gastro-esophageal reflux disease without esophagitis: Secondary | ICD-10-CM

## 2019-11-23 DIAGNOSIS — K2270 Barrett's esophagus without dysplasia: Secondary | ICD-10-CM

## 2019-11-23 DIAGNOSIS — K2950 Unspecified chronic gastritis without bleeding: Secondary | ICD-10-CM

## 2019-11-23 DIAGNOSIS — K449 Diaphragmatic hernia without obstruction or gangrene: Secondary | ICD-10-CM

## 2019-11-23 DIAGNOSIS — E7800 Pure hypercholesterolemia, unspecified: Secondary | ICD-10-CM | POA: Diagnosis not present

## 2019-11-23 DIAGNOSIS — K22710 Barrett's esophagus with low grade dysplasia: Secondary | ICD-10-CM | POA: Diagnosis not present

## 2019-11-23 LAB — CBC
HCT: 37.3 % — ABNORMAL LOW (ref 39.0–52.0)
Hemoglobin: 12.3 g/dL — ABNORMAL LOW (ref 13.0–17.0)
MCHC: 32.9 g/dL (ref 30.0–36.0)
MCV: 83.4 fl (ref 78.0–100.0)
Platelets: 262 10*3/uL (ref 150.0–400.0)
RBC: 4.47 Mil/uL (ref 4.22–5.81)
RDW: 16.3 % — ABNORMAL HIGH (ref 11.5–15.5)
WBC: 6.9 10*3/uL (ref 4.0–10.5)

## 2019-11-23 MED ORDER — SODIUM CHLORIDE 0.9 % IV SOLN: 500.0000 mL | Freq: Once | INTRAVENOUS | Status: DC

## 2019-11-23 NOTE — Op Note (Signed)
[facility] Patient Name: Ian Rowe Procedure Date: 11/23/2019 9:49 AM MRN: [id number] Endoscopist: Remo Lipps P. Havery Moros , MD Age: 79 Referring MD:  Date of Birth: 06-Jul-1941 Gender: Male Account #: [id number] Procedure:                Upper GI endoscopy Indications:              Follow-up of gastro-esophageal reflux disease,                            belching - on omeprazole with good control of                            heartburn but belching persists Medicines:                Monitored Anesthesia Care Procedure:                Pre-Anesthesia Assessment:                           - Prior to the procedure, a History and Physical                            was performed, and patient medications and                            allergies were reviewed. The patient's tolerance of                            previous anesthesia was also reviewed. The risks                            and benefits of the procedure and the sedation                            options and risks were discussed with the patient.                            All questions were answered, and informed consent                            was obtained. Prior Anticoagulants: The patient has                            taken no previous anticoagulant or antiplatelet                            agents. ASA Grade Assessment: II - A patient with                            mild systemic disease. After reviewing the risks                            and benefits, the patient was deemed in  satisfactory condition to undergo the procedure.                           After obtaining informed consent, the endoscope was                            passed under direct vision. Throughout the                            procedure, the patient's blood pressure, pulse, and                            oxygen saturations were monitored continuously. The                            Endoscope was introduced  through the mouth, and                            advanced to the second part of duodenum. The upper                            GI endoscopy was accomplished without difficulty.                            The patient tolerated the procedure well. Scope In: Scope Out: Findings:                 Esophagogastric landmarks were identified: the                            Z-line was found at 32 cm, the gastroesophageal                            junction was found at 33 cm and the upper extent of                            the gastric folds was found at 37 cm from the                            incisors.                           A 4 cm hiatal hernia was present.                           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 The maximum  longitudinal extent of these mucosal changes was 1                            cm in length. Biopsies were taken with a cold                            forceps for histology.                           The exam of the esophagus was otherwise normal.                           Multiple pedunculated and sessile polyps were found                            in the gastric fundus and in the gastric body. The                            polyps ranged from a few mm to upwards of 2-3 cm in                            size (2 large polyps in particular were a few cms                            in size - one in the fundus another in the distal                            gastric body), all erythematous, suspect                            inflammatory / fundic gland polyps. Biopsies were                            taken with a cold forceps for histology.                           Patchy mildly erythematous mucosa was found in the                            gastric body.                           The  exam of the stomach was otherwise normal.                           Biopsies were taken with a cold forceps in the                            gastric body, at the incisura and in the gastric                            antrum for Helicobacter pylori testing.  Prolapsing mucosa from the pylorus was noted in the                            duodenal bulb, suspect benign ectopic gastric                            mucosa. Biopsies were taken with a cold forceps for                            histology.                           A single small angiodysplastic lesion was found in                            the second portion of the duodenum.                           The exam of the duodenum was otherwise normal. Complications:            No immediate complications. Estimated blood loss:                            Minimal. Estimated Blood Loss:     Estimated blood loss was minimal. Impression:               - Esophagogastric landmarks identified.                           - 4 cm hiatal hernia.                           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 Biopsied.                           - Normal esophagus otherwise                           - Multiple gastric polyps. Some large and inflamed,                            suspect benign fundic gland / inflammatory polyps.                            Biopsied.                           - Erythematous mucosa in the gastric body.                           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                            Biopsied.                           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                           - Normal duodenum otherwise.                           Patient had a normal Hgb in November  (13), but                            given these large inflamed polyps, at risk for                            anemia. Will await pathology results, would check                            CBC to ensure stable Hgb, prior to consideration                            for resecting polyps which are at risk for bleeding                            with removal. Recommendation:           - Patient has a contact number available for                            emergencies. The signs and symptoms of potential                            delayed complications were discussed with the                            patient. Return to normal activities tomorrow.                            Written discharge instructions were provided to the                            patient.                           - Resume previous diet.                           - Continue present medications.                           -  Await pathology results                           - Check CBC                           - Further recommendations pending the results Remo Lipps P. Allure Greaser, MD 11/23/2019 10:22:06 AM This report has been signed electronically.

## 2019-11-23 NOTE — Progress Notes (Signed)
TEMP-LC  V/S-KA

## 2019-11-23 NOTE — Progress Notes (Signed)
Report given to PACU, vss 

## 2019-11-23 NOTE — Progress Notes (Signed)
Called to room to assist during endoscopic procedure.  Patient ID and intended procedure confirmed with present staff. Received instructions for my participation in the procedure from the performing physician.  

## 2019-11-23 NOTE — Patient Instructions (Signed)
Handouts on Barrett's esophagus and hiatal hernia given to you today  Await pathology result    YOU HAD AN ENDOSCOPIC PROCEDURE TODAY AT [facility]:   Refer to the procedure report that was given to you for any specific questions about what was found during the examination.  If the procedure report does not answer your questions, please call your gastroenterologist to clarify.  If you requested that your care partner not be given the details of your procedure findings, then the procedure report has been included in a sealed envelope for you to review at your convenience later.  YOU SHOULD EXPECT: Some feelings of bloating in the abdomen. Passage of more gas than usual.  Walking can help get rid of the air that was put into your GI tract during the procedure and reduce the bloating. If you had a lower endoscopy (such as a colonoscopy or flexible sigmoidoscopy) you may notice spotting of blood in your stool or on the toilet paper. If you underwent a bowel prep for your procedure, you may not have a normal bowel movement for a few days.  Please Note:  You might notice some irritation and congestion in your nose or some drainage.  This is from the oxygen used during your procedure.  There is no need for concern and it should clear up in a day or so.  SYMPTOMS TO REPORT IMMEDIATELY:   Following upper endoscopy (EGD)  Vomiting of blood or coffee ground material  New chest pain or pain under the shoulder blades  Painful or persistently difficult swallowing  New shortness of breath  Fever of 100F or higher  Black, tarry-looking stools  For urgent or emergent issues, a gastroenterologist can be reached at any hour by calling [phone]. Do not use MyChart messaging for urgent concerns.    DIET:  We do recommend a small meal at first, but then you may proceed to your regular diet.  Drink plenty of fluids but you should avoid alcoholic beverages for 24 hours.  ACTIVITY:  You  should plan to take it easy for the rest of today and you should NOT DRIVE or use heavy machinery until tomorrow (because of the sedation medicines used during the test).    FOLLOW UP: Our staff will call the number listed on your records 48-72 hours following your procedure to check on you and address any questions or concerns that you may have regarding the information given to you following your procedure. If we do not reach you, we will leave a message.  We will attempt to reach you two times.  During this call, we will ask if you have developed any symptoms of COVID 19. If you develop any symptoms (ie: fever, flu-like symptoms, shortness of breath, cough etc.) before then, please call [phone].  If you test positive for Covid 19 in the 2 weeks post procedure, please call and report this information to [place].    If any biopsies were taken you will be contacted by phone or by letter within the next 1-3 weeks.  Please call us at [phone] if you have not heard about the biopsies in 3 weeks.    SIGNATURES/CONFIDENTIALITY: You and/or your care partner have signed paperwork which will be entered into your electronic medical record.  These signatures attest to the fact that that the information above on your After Visit Summary has been reviewed and is understood.  Full responsibility of the confidentiality of this discharge information lies with  you and/or your care-partner.

## 2019-11-25 ENCOUNTER — Telehealth: Payer: Self-pay | Admitting: *Deleted

## 2019-11-25 NOTE — Telephone Encounter (Signed)
Message left

## 2019-11-25 NOTE — Telephone Encounter (Signed)
  Follow up Call-  Call back number 11/23/2019 05/20/2019  Post procedure Call Back phone  # [phone]  Permission to leave phone message Yes Yes  Some recent data might be hidden     Patient questions:  Message left to call us if necessary.

## 2019-12-12 ENCOUNTER — Telehealth: Payer: Self-pay

## 2019-12-12 NOTE — Telephone Encounter (Signed)
-----   Message from Irving Copas., MD sent at 12/06/2019  1:14 PM EDT ----- Regarding: RE: BE with LGD, large gastric polyps Asjah Rauda or Covering RN, Please work on scheduling clinic visit +/- EGD with EMR (90 minute slot) in hospital after clinic visit. Thanks. GM ----- Message ----- From: Yetta Flock, MD Sent: 12/06/2019   1:12 PM EDT To: [name], RN, Irving Copas., MD Subject: RE: BE with LGD, large gastric polyps          Thanks Gabe, appreciate the follow up.  Yes, patient is on board with seeing you and booking a procedure. I spoke with him about the possibility already. Thank you.  Teryl Mcconaghy, please see Dr. Donneta Romberg note below. Can you help coordinate office visit and hospital EGD with polypectomy with him? Thanks  [name] ----- Message ----- From: Irving Copas., MD Sent: 12/06/2019   3:01 AM EDT To: Yetta Flock, MD Subject: RE: BE with LGD, large gastric polyps          Steve,Interesting case.Saw he has been relatively controlled with his heartburn symptoms for a while on once daily dosing in his endoscopy was really just to evaluate for the belching.I do think it is reasonable for [place] to proceed with attempt at removal of the larger polyps.  I do think it is reasonable to remove these if they could be a source for potential developing anemia or chronic blood loss anemia.I would favor repeat biopsy at the time of my endoscopy rather than proceeding with radiofrequency ablation for this short segment Barrett's unless he has evidence of high-grade dysplasia on repeat biopsies.If the patient is amenable to talking in clinic and then proceeding with scheduling a clinic visit, I am happy to move forward with getting him on the books and getting him on for a hospital procedure.Please let me know and then we can let Ameilia Rattan or the covering nurse for Yusuke Beza to move forward with scheduling the procedure and clinic visit.Thanks.GM ----- Message  ----- From: Yetta Flock, MD Sent: 11/30/2019   8:09 AM EDT To: Irving Copas., MD Subject: BE with LGD, large gastric polyps              Sloan Leiter, Was wondering if you could help me out with this patient.  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  Thanks  [name]

## 2019-12-14 ENCOUNTER — Other Ambulatory Visit: Payer: Self-pay

## 2019-12-14 DIAGNOSIS — K317 Polyp of stomach and duodenum: Secondary | ICD-10-CM

## 2019-12-14 NOTE — Telephone Encounter (Signed)
The pt was contacted and scheduled .  All questions answered

## 2019-12-14 NOTE — Telephone Encounter (Signed)
Left message on machine to call back  

## 2019-12-14 NOTE — Telephone Encounter (Signed)
EGD EMR scheduled for 5/3 [facility] with Dr Jerilynn Mages COVID scheduled for 4/29 1010

## 2019-12-14 NOTE — Telephone Encounter (Signed)
EGD EMR scheduled, pt instructed and medications reviewed.  Patient instructions available in My Chart. Confirmed pt can view My Chart.  Patient to call with any questions or concerns.

## 2020-01-10 DIAGNOSIS — H401132 Primary open-angle glaucoma, bilateral, moderate stage: Secondary | ICD-10-CM | POA: Diagnosis not present

## 2020-01-10 DIAGNOSIS — Z961 Presence of intraocular lens: Secondary | ICD-10-CM | POA: Diagnosis not present

## 2020-01-10 DIAGNOSIS — H52203 Unspecified astigmatism, bilateral: Secondary | ICD-10-CM | POA: Diagnosis not present

## 2020-01-12 ENCOUNTER — Other Ambulatory Visit (HOSPITAL_COMMUNITY)
Admission: RE | Admit: 2020-01-12 | Discharge: 2020-01-12 | Disposition: A | Discharge status: Home / Self Care | Payer: Medicare Other | Source: Ambulatory Visit | Attending: Gastroenterology | Admitting: Gastroenterology

## 2020-01-12 DIAGNOSIS — Z20822 Contact with and (suspected) exposure to covid-19: Secondary | ICD-10-CM | POA: Diagnosis not present

## 2020-01-12 DIAGNOSIS — Z01812 Encounter for preprocedural laboratory examination: Secondary | ICD-10-CM | POA: Insufficient documentation

## 2020-01-12 LAB — SARS CORONAVIRUS 2 (TAT 6-24 HRS): SARS Coronavirus 2: NEGATIVE

## 2020-01-13 ENCOUNTER — Telehealth: Payer: Self-pay

## 2020-01-13 ENCOUNTER — Other Ambulatory Visit: Payer: Self-pay

## 2020-01-13 ENCOUNTER — Encounter (HOSPITAL_COMMUNITY): Payer: Self-pay | Admitting: Gastroenterology

## 2020-01-13 ENCOUNTER — Telehealth (INDEPENDENT_AMBULATORY_CARE_PROVIDER_SITE_OTHER): Payer: Medicare Other | Admitting: Gastroenterology

## 2020-01-13 DIAGNOSIS — K317 Polyp of stomach and duodenum: Secondary | ICD-10-CM

## 2020-01-13 DIAGNOSIS — K22710 Barrett's esophagus with low grade dysplasia: Secondary | ICD-10-CM

## 2020-01-13 DIAGNOSIS — D649 Anemia, unspecified: Secondary | ICD-10-CM

## 2020-01-13 DIAGNOSIS — R933 Abnormal findings on diagnostic imaging of other parts of digestive tract: Secondary | ICD-10-CM

## 2020-01-13 NOTE — Progress Notes (Signed)
Pt denies SOB, chest pain, and being under the care of a cardiologist. Pt stated that PCP is Dr. Hollace Kinnier. Pt denies having an echo but sated that a stress test was performed > 10 years ago. Pt denies having an EKG and chest x ray in the last year. Pt denies recent labs. Pt made aware to stop taking Aspirin (unless otherwise advised by surgeon), Glucosamine, vitamins, fish oil and herbal medications. Do not take any NSAIDs (allergic) ie: Ibuprofen, Advil, Naproxen (Aleve), Motrin, BC and Goody Powder. Pt reminded to quarantine. Pt verbalized understanding of all pre-op instructions.

## 2020-01-13 NOTE — Telephone Encounter (Signed)
-----   Message from Irving Copas., MD sent at 01/13/2020  3:33 AM EDT ----- Regarding: Upcoming EGD with EMR Quashawn Jewkes,This patient has an upcoming EGD with EMR scheduled for Monday. I'm not sure I asked you to set up a clinic visit for the patient, so apologies on my end.Want to be able to go over planned procedure and expectations with risks discussed in greater detail.Can you please try to set up a video visit or Telemedicine visit with me at 1150 AM or 1:10 PM or at 4:10 PM to just go over and discuss things for Monday in detail? Thanks. GM

## 2020-01-13 NOTE — Telephone Encounter (Signed)
I spoke with the pt and he is aware that Dr Rush Landmark will be calling to discuss procedure on Monday

## 2020-01-14 ENCOUNTER — Encounter: Payer: Self-pay | Admitting: Gastroenterology

## 2020-01-14 NOTE — Progress Notes (Signed)
[facility] VISIT   Primary Care Provider Gayland Curry, DO [place] [address] [phone]  Referring Provider Dr. Havery Moros  Patient Profile: Ian Rowe is a 79 y.o. male with a pmh significant for allergies, osteoarthritis, gout, GERD, recent Barrett's esophagus diagnosis.  Hyperplastic gastric polyps, colon polyps, hemorrhoids, hyperlipidemia.  The patient presents to the [facility] for an evaluation and management of problem(s) noted below:  Problem List 1. Gastric polyps   2. Anemia, unspecified type   3. Barrett's esophagus with low grade dysplasia   4. Abnormal endoscopy of upper gastrointestinal tract     I connected with  Bernerd Limbo on 01/13/20. I verified that I was speaking with the correct person using two identifiers. Due to the COVID-19 Pandemic, this service was provided via telemedicine using Audio/Visual Media. The patient was located at home. The provider was located in the office. The patient did consent to this visit and is aware of charges through their insurance as well as the limitations of evaluation and management by telemedicine. The patient was referred by Dr. Havery Moros. Other persons participating in this telemedicine service were the patient's wife. Time spent on visit was a total of 30 minutes as noted below.  History of Present Illness Please see prior consultation and progress notes by Dr. Havery Moros and PA [facility] for full details of HPI.  Interval History The patient recently underwent an upper endoscopy for further evaluation of belching in the setting of well-controlled GERD.  He was found to have Barrett's esophagus with at least low-grade dysplasia concerning for potential near high-grade dysplasia in a short segment.  Patient had multiple hyperplastic appearing polyps that showed evidence of inflammation and oozing.  Patient had new anemia noted as well.  Patient is  referred for this reason to consider endoscopic resection of the gastric lesions and further evaluate the Barrett's esophagus.  Patient continues to take PPI once daily.  No significant changes in regards to his symptoms from a belching perspective.  GERD symptoms remain well controlled.  He has never been on twice daily PPI.  Patient does not take significant nonsteroidals or BC/Goody powders.  Patient denies any significant dark stools or melena.  He has not had any iron indices checked.  He is scheduled for EGD with EMR on Monday this coming week.  GI Review of Systems Positive as above Negative for dysphagia, odynophagia, nausea, vomiting, hematochezia  Review of Systems General: Denies fevers/chills/weight loss HEENT: Denies oral lesions Cardiovascular: Denies chest pain/palpitations Pulmonary: Denies shortness of breath Gastroenterological: See HPI Genitourinary: Denies darkened urine or hematuria Hematological: Denies easy bruising/bleeding Dermatological: Denies jaundice Psychological: Mood is stable   Medications Current Outpatient Medications  Medication Sig Dispense Refill  . b complex vitamins tablet Take 1 tablet by mouth daily.    . bimatoprost (LUMIGAN) 0.01 % SOLN Place 1 drop into both eyes at bedtime.     . cetirizine (ZYRTEC) 10 MG tablet Take 10 mg by mouth daily as needed for allergies.     . Cholecalciferol (VITAMIN D) 2000 UNITS CAPS Take 1 capsule (2,000 Units total) by mouth daily. 30 capsule 3  . folic acid (FOLVITE) 670 MCG tablet Take 800 mcg by mouth daily.     . Glucosamine-Chondroit-Vit C-Mn (GLUCOSAMINE CHONDR 1500 COMPLX PO) Take 2 tablets by mouth daily.     . hydrocortisone cream 1 % Apply 1 application topically daily as needed for itching.    Marland Kitchen omeprazole (PRILOSEC) 40 MG  capsule TAKE ONE CAPSULE BY MOUTH DAILY (Patient taking differently: Take 40 mg by mouth daily. ) 90 capsule 1  . rosuvastatin (CRESTOR) 5 MG tablet TAKE ONE TABLET BY MOUTH DAILY  (Patient taking differently: Take 5 mg by mouth every evening. ) 90 tablet 2  . timolol (TIMOPTIC) 0.5 % ophthalmic solution Place 1 drop into both eyes daily.     Marland Kitchen triamcinolone (NASACORT ALLERGY 24HR) 55 MCG/ACT AERO nasal inhaler Place 2 sprays into the nose daily as needed (congestion).    . vitamin B-12 (CYANOCOBALAMIN) 1000 MCG tablet Take 1,000 mcg by mouth daily.     No current facility-administered medications for this visit.    Allergies Allergies  Allergen Reactions  . Black Pepper [Piper] Other (See Comments)    bloating  . Ciprofloxacin Other (See Comments)    Bleeding-blood in stool   . Nsaids     History of bleeding    Histories Past Medical History:  Diagnosis Date  . Allergy    seasonal  . Arthritis    knees  . Cataract    BILATERAL REMOVED  . Gastric polyp   . GERD (gastroesophageal reflux disease)   . Glaucoma   . Hemorrhoids   . Hyperlipidemia   . Pneumonia    as teenager  . RBBB   . RBBB   . Vasovagal syncope    last episode was 2015-16; none since  . Wears glasses    Past Surgical History:  Procedure Laterality Date  . CARDIAC CATHETERIZATION  1993  . CATARACT EXTRACTION W/ INTRAOCULAR LENS  IMPLANT, BILATERAL    . COLONOSCOPY  2002; 2007  . POLYPECTOMY    . WISDOM TOOTH EXTRACTION     Social History   Socioeconomic History  . Marital status: Married    Spouse name: Mardene Celeste  . Number of children: 2  . Years of education: Not on file  . Highest education level: Not on file  Occupational History  . Occupation: Retired Clinical biochemist  Tobacco Use  . Smoking status: Never Smoker  . Smokeless tobacco: Never Used  Substance and Sexual Activity  . Alcohol use: Yes    Alcohol/week: 10.0 standard drinks    Types: 7 Shots of liquor, 3 Glasses of wine per week    Comment: 2 scotch at bedtime nightly, occ glass of wine   . Drug use: No  . Sexual activity: Not on file  Other Topics Concern  . Not on file  Social  History Narrative   Moved to [place] 2014.   Married   Never smoked   Alcohol - scotch or wine nightly   Walks 150 minutes per week.    POA   Social Determinants of Health   Financial Resource Strain:   . Difficulty of Paying Living Expenses:   Food Insecurity:   . Worried About Charity fundraiser in the Last Year:   . Arboriculturist in the Last Year:   Transportation Needs:   . Film/video editor (Medical):   Marland Kitchen Lack of Transportation (Non-Medical):   Physical Activity:   . Days of Exercise per Week:   . Minutes of Exercise per Session:   Stress:   . Feeling of Stress :   Social Connections:   . Frequency of Communication with Friends and Family:   . Frequency of Social Gatherings with Friends and Family:   . Attends Religious Services:   . Active Member of Clubs or Organizations:   . Attends Club  or Organization Meetings:   Marland Kitchen Marital Status:   Intimate Partner Violence:   . Fear of Current or Ex-Partner:   . Emotionally Abused:   Marland Kitchen Physically Abused:   . Sexually Abused:    Family History  Problem Relation Age of Onset  . Alzheimer's disease Mother   . Heart disease Father   . Colon cancer Neg Hx   . Colon polyps Neg Hx   . Esophageal cancer Neg Hx   . Stomach cancer Neg Hx   . Rectal cancer Neg Hx   . Inflammatory bowel disease Neg Hx   . Liver disease Neg Hx   . Pancreatic cancer Neg Hx    I have reviewed his medical, social, and family history in detail and updated the electronic medical record as necessary.    PHYSICAL EXAMINATION  Telemedicine visit   REVIEW OF DATA  I reviewed the following data at the time of this encounter:  GI Procedures and Studies  March 2021 EGD - Esophagogastric landmarks identified. - 4 cm hiatal hernia. - Esophageal mucosal changes classified as Barrett's stage C0-M1 per [place] criteria. Biopsied. - Normal esophagus otherwise - Multiple gastric polyps. Some large and inflamed, suspect benign fundic gland /  inflammatory polyps. Biopsied. - Erythematous mucosa in the gastric body. - Normal stomach otherwise - biopsies taken to rule out H pylori - Suspected ectopic mucosa in the duodenal bulb. Biopsied. 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 - Normal duodenum otherwise. Pathology Diagnosis 1. Surgical [P], duodenal bulb - GASTRIC TYPE MUCOSA WITH REACTIVE CHANGES AND CHRONIC INFLAMMATION. - NO INTESTINAL METAPLASIA, DYSPLASIA, OR MALIGNANCY. 2. Surgical [P], gastric polyps - HYPERPLASTIC POLYP WITH ULCERATION. - NO INTESTINAL METAPLASIA, DYSPLASIA, OR MALIGNANCY. 3. Surgical [P], gastric antrum and gastric body - REACTIVE GASTROPATHY WITH CHRONIC INFLAMMATION. Ian Rowe IS NEGATIVE FOR HELICOBACTER PYLORI. - NO INTESTINAL METAPLASIA, DYSPLASIA, OR MALIGNANCY. 4. Surgical [P], lower esophagus - INTESTINAL METAPLASIA (BARRETT'S ESOPHAGUS) WITH AT LEAST LOW GRADE DYSPLASIA, SEE COMMENT. 4. There is focal dysplasia which is at least low grade, but approaching high grade in nature. p53 supports the diagnosis of dysplasia.  Laboratory Studies  Reviewed that in epic  Imaging Studies  No relevant imaging to review   ASSESSMENT  Mr. Sparr is a 79 y.o. male with a pmh significant for allergies, osteoarthritis, gout, GERD, recent Barrett's esophagus diagnosis.  Hyperplastic gastric polyps, colon polyps, hemorrhoids, hyperlipidemia.  The patient is seen today for evaluation and management of:  1. Gastric polyps   2. Anemia, unspecified type   3. Barrett's esophagus with low grade dysplasia   4. Abnormal endoscopy of upper gastrointestinal tract    The patient seems to be hemodynamically stable.  Clinically, he has had new anemia with findings on upper endoscopy of multiple hyperplastic appearing polyps as well as large polyps.  He also has a new diagnosis of Barrett's esophagus.  Today we discussed the definition of Barrett's esophagus and the role of endoscopic  surveillance that is required.  The patient has at this time low-grade dysplasia.  At endoscopy we will reevaluate the area ensure there is no nodularity and consider endoscopic approaches in the future depending on what we find and probably we biopsied the region.  If there is significant nodularity we may need to consider in endoscopic mucosal resection of that area.  That was not previously seen just within the last month so I suspect it will be unlikely but we will see.  Hyperplastic polyps are large and may  require additional endoscopic therapy and epinephrine as well as potentially a polyloop so we will see how things look and decide based on the area and location about how to we approach the resection of these.  May consider some Band resections as well.  Based upon the description and endoscopic pictures I do feel that it is reasonable to pursue an Advanced Polypectomy attempt of the polyps/lesions.  We discussed some of the techniques of advanced polypectomy which include Endoscopic Mucosal Resection and Tissue Ablation via Fulguration.  The risks and benefits of endoscopic evaluation were discussed with the patient; these include but are not limited to the risk of perforation, infection, bleeding, missed lesions, lack of diagnosis, severe illness requiring hospitalization, as well as anesthesia and sedation related illnesses.  During attempts at advanced resection, the risks of bleeding and perforation/leak are increased as opposed to diagnostic and screening procedures, and that was discussed with the patient as well.  If, after attempt at removal of the polyp/lesion, it is found that the patient has a complication or that an invasive lesion or malignant lesion is found, or that the polyp/lesion continues to recur, the patient is aware and understands that surgery may still be indicated/required.  All patient questions were answered, to the best of my ability, and the patient agrees to the aforementioned  plan of action with follow-up as indicated.   PLAN  Proceed to schedule EGD with EMR next week We will obtain a CBC/iron/TIBC/ferritin when he comes in for his procedure next week so that we can follow this in the future we will evaluate the Barrett's esophagus and likely repeat biopsies will consider endoscopic resection if any nodularity is noted After resection, hopefully of the gastric polyp safely, we will likely increase his PPI therapy twice daily   No orders of the defined types were placed in this encounter.   New Prescriptions   No medications on file   Modified Medications   No medications on file    Planned Follow Up No follow-ups on file.   Total Time in Face-to-Face and in Coordination of Care for patient including independent/personal interpretation/review of prior testing, medical history, examination, medication adjustment, communicating results with the patient directly, and documentation with the EHR is 30 minutes.   Justice Britain, MD [facility] Advanced [facility]oscopy Office # [phone]

## 2020-01-14 NOTE — H&P (View-Only) (Signed)
[place] VISIT   Primary Care Provider Gayland Curry, DO [place] [address] [phone]  Referring Provider Dr. Havery Moros  Patient Profile: Ian Rowe is a 79 y.o. male with a pmh significant for allergies, osteoarthritis, gout, GERD, recent Barrett's esophagus diagnosis.  Hyperplastic gastric polyps, colon polyps, hemorrhoids, hyperlipidemia.  The patient presents to the [facility] for an evaluation and management of problem(s) noted below:  Problem List 1. Gastric polyps   2. Anemia, unspecified type   3. Barrett's esophagus with low grade dysplasia   4. Abnormal endoscopy of upper gastrointestinal tract     I connected with  Bernerd Limbo on 01/13/20. I verified that I was speaking with the correct person using two identifiers. Due to the COVID-19 Pandemic, this service was provided via telemedicine using Audio/Visual Media. The patient was located at home. The provider was located in the office. The patient did consent to this visit and is aware of charges through their insurance as well as the limitations of evaluation and management by telemedicine. The patient was referred by Dr. Havery Moros. Other persons participating in this telemedicine service were the patient's wife. Time spent on visit was a total of 30 minutes as noted below.  History of Present Illness Please see prior consultation and progress notes by Dr. Havery Moros and PA [facility] for full details of HPI.  Interval History The patient recently underwent an upper endoscopy for further evaluation of belching in the setting of well-controlled GERD.  He was found to have Barrett's esophagus with at least low-grade dysplasia concerning for potential near high-grade dysplasia in a short segment.  Patient had multiple hyperplastic appearing polyps that showed evidence of inflammation and oozing.  Patient had new anemia noted as well.  Patient is  referred for this reason to consider endoscopic resection of the gastric lesions and further evaluate the Barrett's esophagus.  Patient continues to take PPI once daily.  No significant changes in regards to his symptoms from a belching perspective.  GERD symptoms remain well controlled.  He has never been on twice daily PPI.  Patient does not take significant nonsteroidals or BC/Goody powders.  Patient denies any significant dark stools or melena.  He has not had any iron indices checked.  He is scheduled for EGD with EMR on Monday this coming week.  GI Review of Systems Positive as above Negative for dysphagia, odynophagia, nausea, vomiting, hematochezia  Review of Systems General: Denies fevers/chills/weight loss HEENT: Denies oral lesions Cardiovascular: Denies chest pain/palpitations Pulmonary: Denies shortness of breath Gastroenterological: See HPI Genitourinary: Denies darkened urine or hematuria Hematological: Denies easy bruising/bleeding Dermatological: Denies jaundice Psychological: Mood is stable   Medications Current Outpatient Medications  Medication Sig Dispense Refill  . b complex vitamins tablet Take 1 tablet by mouth daily.    . bimatoprost (LUMIGAN) 0.01 % SOLN Place 1 drop into both eyes at bedtime.     . cetirizine (ZYRTEC) 10 MG tablet Take 10 mg by mouth daily as needed for allergies.     . Cholecalciferol (VITAMIN D) 2000 UNITS CAPS Take 1 capsule (2,000 Units total) by mouth daily. 30 capsule 3  . folic acid (FOLVITE) 341 MCG tablet Take 800 mcg by mouth daily.     . Glucosamine-Chondroit-Vit C-Mn (GLUCOSAMINE CHONDR 1500 COMPLX PO) Take 2 tablets by mouth daily.     . hydrocortisone cream 1 % Apply 1 application topically daily as needed for itching.    Marland Kitchen omeprazole (PRILOSEC) 40 MG  capsule TAKE ONE CAPSULE BY MOUTH DAILY (Patient taking differently: Take 40 mg by mouth daily. ) 90 capsule 1  . rosuvastatin (CRESTOR) 5 MG tablet TAKE ONE TABLET BY MOUTH DAILY  (Patient taking differently: Take 5 mg by mouth every evening. ) 90 tablet 2  . timolol (TIMOPTIC) 0.5 % ophthalmic solution Place 1 drop into both eyes daily.     Marland Kitchen triamcinolone (NASACORT ALLERGY 24HR) 55 MCG/ACT AERO nasal inhaler Place 2 sprays into the nose daily as needed (congestion).    . vitamin B-12 (CYANOCOBALAMIN) 1000 MCG tablet Take 1,000 mcg by mouth daily.     No current facility-administered medications for this visit.    Allergies Allergies  Allergen Reactions  . Black Pepper [[place]] Other (See Comments)    bloating  . Ciprofloxacin Other (See Comments)    Bleeding-blood in stool   . Nsaids     History of bleeding    Histories Past Medical History:  Diagnosis Date  . Allergy    seasonal  . Arthritis    knees  . Cataract    BILATERAL REMOVED  . Gastric polyp   . GERD (gastroesophageal reflux disease)   . Glaucoma   . Hemorrhoids   . Hyperlipidemia   . Pneumonia    as teenager  . RBBB   . RBBB   . Vasovagal syncope    last episode was 2015-16; none since  . Wears glasses    Past Surgical History:  Procedure Laterality Date  . CARDIAC CATHETERIZATION  1993  . CATARACT EXTRACTION W/ INTRAOCULAR LENS  IMPLANT, BILATERAL    . COLONOSCOPY  2002; 2007  . POLYPECTOMY    . WISDOM TOOTH EXTRACTION     Social History   Socioeconomic History  . Marital status: Married    Spouse name: Mardene Celeste  . Number of children: 2  . Years of education: Not on file  . Highest education level: Not on file  Occupational History  . Occupation: Retired Clinical biochemist  Tobacco Use  . Smoking status: Never Smoker  . Smokeless tobacco: Never Used  Substance and Sexual Activity  . Alcohol use: Yes    Alcohol/week: 10.0 standard drinks    Types: 7 Shots of liquor, 3 Glasses of wine per week    Comment: 2 scotch at bedtime nightly, occ glass of wine   . Drug use: No  . Sexual activity: Not on file  Other Topics Concern  . Not on file  Social  History Narrative   Moved to [place] 2014.   Married   Never smoked   Alcohol - scotch or wine nightly   Walks 150 minutes per week.    POA   Social Determinants of Health   Financial Resource Strain:   . Difficulty of Paying Living Expenses:   Food Insecurity:   . Worried About Charity fundraiser in the Last Year:   . Arboriculturist in the Last Year:   Transportation Needs:   . Film/video editor (Medical):   Marland Kitchen Lack of Transportation (Non-Medical):   Physical Activity:   . Days of Exercise per Week:   . Minutes of Exercise per Session:   Stress:   . Feeling of Stress :   Social Connections:   . Frequency of Communication with Friends and Family:   . Frequency of Social Gatherings with Friends and Family:   . Attends Religious Services:   . Active Member of Clubs or Organizations:   . Attends Club  or Organization Meetings:   Marland Kitchen Marital Status:   Intimate Partner Violence:   . Fear of Current or Ex-Partner:   . Emotionally Abused:   Marland Kitchen Physically Abused:   . Sexually Abused:    Family History  Problem Relation Age of Onset  . Alzheimer's disease Mother   . Heart disease Father   . Colon cancer Neg Hx   . Colon polyps Neg Hx   . Esophageal cancer Neg Hx   . Stomach cancer Neg Hx   . Rectal cancer Neg Hx   . Inflammatory bowel disease Neg Hx   . Liver disease Neg Hx   . Pancreatic cancer Neg Hx    I have reviewed his medical, social, and family history in detail and updated the electronic medical record as necessary.    PHYSICAL EXAMINATION  Telemedicine visit   REVIEW OF DATA  I reviewed the following data at the time of this encounter:  GI Procedures and Studies  March 2021 EGD - Esophagogastric landmarks identified. - 4 cm hiatal hernia. - Esophageal mucosal changes classified as Barrett's stage C0-M1 per [place] criteria. Biopsied. - Normal esophagus otherwise - Multiple gastric polyps. Some large and inflamed, suspect benign fundic gland /  inflammatory polyps. Biopsied. - Erythematous mucosa in the gastric body. - Normal stomach otherwise - biopsies taken to rule out H pylori - Suspected ectopic mucosa in the duodenal bulb. Biopsied. 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 - Normal duodenum otherwise. Pathology Diagnosis 1. Surgical [P], duodenal bulb - GASTRIC TYPE MUCOSA WITH REACTIVE CHANGES AND CHRONIC INFLAMMATION. - NO INTESTINAL METAPLASIA, DYSPLASIA, OR MALIGNANCY. 2. Surgical [P], gastric polyps - HYPERPLASTIC POLYP WITH ULCERATION. - NO INTESTINAL METAPLASIA, DYSPLASIA, OR MALIGNANCY. 3. Surgical [P], gastric antrum and gastric body - REACTIVE GASTROPATHY WITH CHRONIC INFLAMMATION. Hinton Dyer IS NEGATIVE FOR HELICOBACTER PYLORI. - NO INTESTINAL METAPLASIA, DYSPLASIA, OR MALIGNANCY. 4. Surgical [P], lower esophagus - INTESTINAL METAPLASIA (BARRETT'S ESOPHAGUS) WITH AT LEAST LOW GRADE DYSPLASIA, SEE COMMENT. 4. There is focal dysplasia which is at least low grade, but approaching high grade in nature. p53 supports the diagnosis of dysplasia.  Laboratory Studies  Reviewed that in epic  Imaging Studies  No relevant imaging to review   ASSESSMENT  Mr. Heffern is a 79 y.o. male with a pmh significant for allergies, osteoarthritis, gout, GERD, recent Barrett's esophagus diagnosis.  Hyperplastic gastric polyps, colon polyps, hemorrhoids, hyperlipidemia.  The patient is seen today for evaluation and management of:  1. Gastric polyps   2. Anemia, unspecified type   3. Barrett's esophagus with low grade dysplasia   4. Abnormal endoscopy of upper gastrointestinal tract    The patient seems to be hemodynamically stable.  Clinically, he has had new anemia with findings on upper endoscopy of multiple hyperplastic appearing polyps as well as large polyps.  He also has a new diagnosis of Barrett's esophagus.  Today we discussed the definition of Barrett's esophagus and the role of endoscopic  surveillance that is required.  The patient has at this time low-grade dysplasia.  At endoscopy we will reevaluate the area ensure there is no nodularity and consider endoscopic approaches in the future depending on what we find and probably we biopsied the region.  If there is significant nodularity we may need to consider in endoscopic mucosal resection of that area.  That was not previously seen just within the last month so I suspect it will be unlikely but we will see.  Hyperplastic polyps are large and may  require additional endoscopic therapy and epinephrine as well as potentially a polyloop so we will see how things look and decide based on the area and location about how to we approach the resection of these.  May consider some Band resections as well.  Based upon the description and endoscopic pictures I do feel that it is reasonable to pursue an Advanced Polypectomy attempt of the polyps/lesions.  We discussed some of the techniques of advanced polypectomy which include Endoscopic Mucosal Resection and Tissue Ablation via Fulguration.  The risks and benefits of endoscopic evaluation were discussed with the patient; these include but are not limited to the risk of perforation, infection, bleeding, missed lesions, lack of diagnosis, severe illness requiring hospitalization, as well as anesthesia and sedation related illnesses.  During attempts at advanced resection, the risks of bleeding and perforation/leak are increased as opposed to diagnostic and screening procedures, and that was discussed with the patient as well.  If, after attempt at removal of the polyp/lesion, it is found that the patient has a complication or that an invasive lesion or malignant lesion is found, or that the polyp/lesion continues to recur, the patient is aware and understands that surgery may still be indicated/required.  All patient questions were answered, to the best of my ability, and the patient agrees to the aforementioned  plan of action with follow-up as indicated.   PLAN  Proceed to schedule EGD with EMR next week We will obtain a CBC/iron/TIBC/ferritin when he comes in for his procedure next week so that we can follow this in the future we will evaluate the Barrett's esophagus and likely repeat biopsies will consider endoscopic resection if any nodularity is noted After resection, hopefully of the gastric polyp safely, we will likely increase his PPI therapy twice daily   No orders of the defined types were placed in this encounter.   New Prescriptions   No medications on file   Modified Medications   No medications on file    Planned Follow Up No follow-ups on file.   Total Time in Face-to-Face and in Coordination of Care for patient including independent/personal interpretation/review of prior testing, medical history, examination, medication adjustment, communicating results with the patient directly, and documentation with the EHR is 30 minutes.   Justice Britain, MD [facility] Advanced Endoscopy Office # [phone]

## 2020-01-16 ENCOUNTER — Encounter (HOSPITAL_COMMUNITY)
Admission: RE | Disposition: A | Discharge status: Home / Self Care | Payer: Self-pay | Source: Ambulatory Visit | Attending: Gastroenterology

## 2020-01-16 ENCOUNTER — Encounter (HOSPITAL_COMMUNITY): Payer: Self-pay | Admitting: Gastroenterology

## 2020-01-16 ENCOUNTER — Other Ambulatory Visit: Payer: Self-pay

## 2020-01-16 ENCOUNTER — Ambulatory Visit (HOSPITAL_COMMUNITY)
Admission: RE | Admit: 2020-01-16 | Discharge: 2020-01-16 | Disposition: A | Discharge status: Home / Self Care | Payer: Medicare Other | Source: Ambulatory Visit | Attending: Gastroenterology | Admitting: Gastroenterology

## 2020-01-16 ENCOUNTER — Ambulatory Visit (HOSPITAL_COMMUNITY): Payer: Medicare Other | Admitting: Anesthesiology

## 2020-01-16 ENCOUNTER — Encounter: Payer: Self-pay | Admitting: Internal Medicine

## 2020-01-16 DIAGNOSIS — K22710 Barrett's esophagus with low grade dysplasia: Secondary | ICD-10-CM | POA: Insufficient documentation

## 2020-01-16 DIAGNOSIS — K219 Gastro-esophageal reflux disease without esophagitis: Secondary | ICD-10-CM | POA: Diagnosis not present

## 2020-01-16 DIAGNOSIS — K317 Polyp of stomach and duodenum: Secondary | ICD-10-CM | POA: Insufficient documentation

## 2020-01-16 DIAGNOSIS — E785 Hyperlipidemia, unspecified: Secondary | ICD-10-CM | POA: Insufficient documentation

## 2020-01-16 DIAGNOSIS — K259 Gastric ulcer, unspecified as acute or chronic, without hemorrhage or perforation: Secondary | ICD-10-CM | POA: Diagnosis not present

## 2020-01-16 DIAGNOSIS — K2270 Barrett's esophagus without dysplasia: Secondary | ICD-10-CM | POA: Diagnosis not present

## 2020-01-16 DIAGNOSIS — Z79899 Other long term (current) drug therapy: Secondary | ICD-10-CM | POA: Diagnosis not present

## 2020-01-16 DIAGNOSIS — M1990 Unspecified osteoarthritis, unspecified site: Secondary | ICD-10-CM | POA: Insufficient documentation

## 2020-01-16 DIAGNOSIS — K449 Diaphragmatic hernia without obstruction or gangrene: Secondary | ICD-10-CM | POA: Diagnosis not present

## 2020-01-16 DIAGNOSIS — D500 Iron deficiency anemia secondary to blood loss (chronic): Secondary | ICD-10-CM | POA: Insufficient documentation

## 2020-01-16 DIAGNOSIS — Z881 Allergy status to other antibiotic agents status: Secondary | ICD-10-CM | POA: Diagnosis not present

## 2020-01-16 DIAGNOSIS — Z886 Allergy status to analgesic agent status: Secondary | ICD-10-CM | POA: Diagnosis not present

## 2020-01-16 DIAGNOSIS — D649 Anemia, unspecified: Secondary | ICD-10-CM | POA: Insufficient documentation

## 2020-01-16 DIAGNOSIS — R933 Abnormal findings on diagnostic imaging of other parts of digestive tract: Secondary | ICD-10-CM | POA: Insufficient documentation

## 2020-01-16 HISTORY — PX: HEMOSTASIS CLIP PLACEMENT: SHX6857

## 2020-01-16 HISTORY — DX: Syncope and collapse: R55

## 2020-01-16 HISTORY — PX: HEMOSTASIS CONTROL: SHX6838

## 2020-01-16 HISTORY — PX: ENDOSCOPIC MUCOSAL RESECTION: SHX6839

## 2020-01-16 HISTORY — DX: Presence of spectacles and contact lenses: Z97.3

## 2020-01-16 HISTORY — PX: ESOPHAGOGASTRODUODENOSCOPY (EGD) WITH PROPOFOL: SHX5813

## 2020-01-16 HISTORY — PX: SUBMUCOSAL LIFTING INJECTION: SHX6855

## 2020-01-16 HISTORY — PX: BIOPSY: SHX5522

## 2020-01-16 HISTORY — DX: Polyp of stomach and duodenum: K31.7

## 2020-01-16 HISTORY — DX: Pneumonia, unspecified organism: J18.9

## 2020-01-16 LAB — IRON AND TIBC
Iron: 37 ug/dL — ABNORMAL LOW (ref 45–182)
Saturation Ratios: 7 % — ABNORMAL LOW (ref 17.9–39.5)
TIBC: 511 ug/dL — ABNORMAL HIGH (ref 250–450)
UIBC: 474 ug/dL

## 2020-01-16 LAB — CBC
HCT: 38.7 % — ABNORMAL LOW (ref 39.0–52.0)
Hemoglobin: 12.1 g/dL — ABNORMAL LOW (ref 13.0–17.0)
MCH: 27 pg (ref 26.0–34.0)
MCHC: 31.3 g/dL (ref 30.0–36.0)
MCV: 86.4 fL (ref 80.0–100.0)
Platelets: 260 10*3/uL (ref 150–400)
RBC: 4.48 MIL/uL (ref 4.22–5.81)
RDW: 15.4 % (ref 11.5–15.5)
WBC: 6.6 10*3/uL (ref 4.0–10.5)
nRBC: 0 % (ref 0.0–0.2)

## 2020-01-16 LAB — FERRITIN: Ferritin: 6 ng/mL — ABNORMAL LOW (ref 24–336)

## 2020-01-16 SURGERY — ESOPHAGOGASTRODUODENOSCOPY (EGD) WITH PROPOFOL
Anesthesia: Monitor Anesthesia Care

## 2020-01-16 MED ORDER — OMEPRAZOLE 40 MG PO CPDR: 40.0000 mg | DELAYED_RELEASE_CAPSULE | Freq: Two times a day (BID) | ORAL | 0 refills | Status: DC

## 2020-01-16 MED ORDER — SODIUM CHLORIDE 0.9 % IV SOLN: INTRAVENOUS | Status: DC

## 2020-01-16 MED ORDER — PROPOFOL 500 MG/50ML IV EMUL
INTRAVENOUS | Status: DC | PRN
  Administered 2020-01-16: 75 ug/kg/min via INTRAVENOUS

## 2020-01-16 MED ORDER — LACTATED RINGERS IV SOLN: INTRAVENOUS | Status: DC | PRN

## 2020-01-16 MED ORDER — PROPOFOL 10 MG/ML IV BOLUS
INTRAVENOUS | Status: DC | PRN
  Administered 2020-01-16: 20 mg via INTRAVENOUS
  Administered 2020-01-16: 30 mg via INTRAVENOUS
  Administered 2020-01-16 (×2): 50 mg via INTRAVENOUS
  Administered 2020-01-16: 30 mg via INTRAVENOUS

## 2020-01-16 MED ORDER — EPINEPHRINE 1 MG/10ML IJ SOSY
PREFILLED_SYRINGE | INTRAMUSCULAR | Status: AC
  Filled 2020-01-16: qty 10

## 2020-01-16 MED ORDER — SODIUM CHLORIDE (PF) 0.9 % IJ SOLN
PREFILLED_SYRINGE | INTRAMUSCULAR | Status: DC | PRN
  Administered 2020-01-16: 13:00:00 7.5 mL

## 2020-01-16 MED ORDER — LIDOCAINE HCL (CARDIAC) PF 100 MG/5ML IV SOSY
PREFILLED_SYRINGE | INTRAVENOUS | Status: DC | PRN
  Administered 2020-01-16: 40 mg via INTRATRACHEAL

## 2020-01-16 SURGICAL SUPPLY — 15 items

## 2020-01-16 NOTE — Discharge Instructions (Signed)
YOU HAD AN ENDOSCOPIC PROCEDURE TODAY: Refer to the procedure report and other information in the discharge instructions given to you for any specific questions about what was found during the examination. If this information does not answer your questions, please call [place] office at [phone] to clarify.   YOU SHOULD EXPECT: Some feelings of bloating in the abdomen. Passage of more gas than usual. Walking can help get rid of the air that was put into your GI tract during the procedure and reduce the bloating. If you had a lower endoscopy (such as a colonoscopy or flexible sigmoidoscopy) you may notice spotting of blood in your stool or on the toilet paper. Some abdominal soreness may be present for a day or two, also.  DIET: Your first meal following the procedure should be a light meal and then it is ok to progress to your normal diet. A half-sandwich or bowl of soup is an example of a good first meal. Heavy or fried foods are harder to digest and may make you feel nauseous or bloated. Drink plenty of fluids but you should avoid alcoholic beverages for 24 hours. If you had a esophageal dilation, please see attached instructions for diet.    ACTIVITY: Your care partner should take you home directly after the procedure. You should plan to take it easy, moving slowly for the rest of the day. You can resume normal activity the day after the procedure however YOU SHOULD NOT DRIVE, use power tools, machinery or perform tasks that involve climbing or major physical exertion for 24 hours (because of the sedation medicines used during the test).   SYMPTOMS TO REPORT IMMEDIATELY: A gastroenterologist can be reached at any hour. Please call [phone]  for any of the following symptoms:   Following upper endoscopy (EGD, EUS, ERCP, esophageal dilation) Vomiting of blood or coffee ground material  New, significant abdominal pain  New, significant chest pain or pain under the shoulder blades  Painful or  persistently difficult swallowing  New shortness of breath  Black, tarry-looking or red, bloody stools  FOLLOW UP:  If any biopsies were taken you will be contacted by phone or by letter within the next 1-3 weeks. Call [phone]  if you have not heard about the biopsies in 3 weeks.  Please also call with any specific questions about appointments or follow up tests.  

## 2020-01-16 NOTE — Anesthesia Postprocedure Evaluation (Signed)
Anesthesia Post Note  Patient: Ian Rowe  Procedure(s) Performed: ESOPHAGOGASTRODUODENOSCOPY (EGD) WITH PROPOFOL (N/A ) ENDOSCOPIC MUCOSAL RESECTION (N/A ) SUBMUCOSAL LIFTING INJECTION HEMOSTASIS CONTROL HEMOSTASIS CLIP PLACEMENT FOREIGN BODY REMOVAL BIOPSY     Anesthesia Type: MAC Level of consciousness: patient cooperative and awake Pain management: pain level controlled Vital Signs Assessment: post-procedure vital signs reviewed and stable Respiratory status: spontaneous breathing, nonlabored ventilation, respiratory function stable and patient connected to nasal cannula oxygen Cardiovascular status: stable and blood pressure returned to baseline Postop Assessment: no apparent nausea or vomiting Anesthetic complications: no    Last Vitals:  Vitals:   01/16/20 1350 01/16/20 1400  BP: (!) 128/107 (!) 179/87  Pulse: (!) 59 60  Resp: 20 18  Temp:    SpO2: 100% 100%    Last Pain:  Vitals:   01/16/20 1400  TempSrc:   PainSc: 0-No pain                 Nazirah Tri

## 2020-01-16 NOTE — Telephone Encounter (Signed)
Ms.Dominico,Dr.Reed out of the  office today.will have look at your Endoscopy lab results when she comes back. Thank You  Jotham Ahn  FNP-C

## 2020-01-16 NOTE — Anesthesia Preprocedure Evaluation (Signed)
Anesthesia Evaluation  Patient identified by MRN, date of birth, ID band Patient awake    Reviewed: Allergy & Precautions, H&P , NPO status , Patient's Chart, lab work & pertinent test results  Airway Mallampati: II   Neck ROM: full    Dental   Pulmonary neg pulmonary ROS,    breath sounds clear to auscultation       Cardiovascular  Rhythm:regular Rate:Normal  RBBB   Neuro/Psych  Neuromuscular disease    GI/Hepatic GERD  ,  Endo/Other    Renal/GU      Musculoskeletal  (+) Arthritis ,   Abdominal   Peds  Hematology   Anesthesia Other Findings   Reproductive/Obstetrics                             Anesthesia Physical Anesthesia Plan  ASA: II  Anesthesia Plan: MAC   Post-op Pain Management:    Induction: Intravenous  PONV Risk Score and Plan: 1 and Propofol infusion and Treatment may vary due to age or medical condition  Airway Management Planned: Simple Face Mask  Additional Equipment:   Intra-op Plan:   Post-operative Plan:   Informed Consent: I have reviewed the patients History and Physical, chart, labs and discussed the procedure including the risks, benefits and alternatives for the proposed anesthesia with the patient or authorized representative who has indicated his/her understanding and acceptance.       Plan Discussed with: CRNA and Anesthesiologist  Anesthesia Plan Comments:         Anesthesia Quick Evaluation

## 2020-01-16 NOTE — Op Note (Signed)
[facility] Patient Name: Ian Rowe Procedure Date : 01/16/2020 MRN: [id number] Attending MD: Justice Britain , MD Date of Birth: Jul 31, 1941 CSN: [id number] Age: 79 Admit Type: Outpatient Procedure:                Upper GI endoscopy Indications:              Iron deficiency anemia secondary to chronic blood                            loss, Barrett's low grade dysplasia, Gastric                            polyps, For therapy of gastric polyps Providers:                Justice Britain, MD, Carlyn Reichert, RN, Neldon Newport CRNA, CRNA, Elspeth Cho Tech.,                            Technician, William Dalton, Technician Referring MD:             Carlota Raspberry. Havery Moros, MD, Chillum Reed Medicines:                Monitored Anesthesia Care Complications:            No immediate complications. Estimated Blood Loss:     Estimated blood loss was minimal. Procedure:                Pre-Anesthesia Assessment:                           - Prior to the procedure, a History and Physical                            was performed, and patient medications and                            allergies were reviewed. The patient's tolerance of                            previous anesthesia was also reviewed. The risks                            and benefits of the procedure and the sedation                            options and risks were discussed with the patient.                            All questions were answered, and informed consent                            was obtained. Prior Anticoagulants: The patient has  taken no previous anticoagulant or antiplatelet                            agents. ASA Grade Assessment: III - A patient with                            severe systemic disease. After reviewing the risks                            and benefits, the patient was deemed in                            satisfactory  condition to undergo the procedure.                           After obtaining informed consent, the endoscope was                            passed under direct vision. Throughout the                            procedure, the patient's blood pressure, pulse, and                            oxygen saturations were monitored continuously. The                            [id number]-[id number] ([id number]) Olympus therapeutic                            gastroscope was introduced through the mouth, and                            advanced to the second part of duodenum. The upper                            GI endoscopy was accomplished without difficulty.                            The patient tolerated the procedure. Scope In: Scope Out: Findings:      No gross lesions were noted in the proximal esophagus and in the mid       esophagus.      The esophagus and gastroesophageal junction were examined with white       light and narrow band imaging (NBI) from a forward view and retroflexed       position. There were esophageal mucosal changes consistent with       short-segment Barrett's esophagus. These changes involved the mucosa       along an irregular Z-line (34 cm from the incisors). Circumferential       salmon-colored mucosa was present from 33 to 34 cm, three tongues of       salmon-colored mucosa were present from 32 to 34 cm and scattered       islands of salmon-colored mucosa were present at 32 cm. The  maximum       longitudinal extent of these esophageal mucosal changes was 2 cm in       length. Mucosa was biopsied with a cold forceps for histology in a       targeted and 4 quadrants manner at intervals of 1 cm from 32 to 34 cm       from the incisors. A total of 3 specimen bottles were sent to pathology.      A medium-sized hiatal hernia was found. The proximal extent of the       gastric folds (end of tubular esophagus) was 34 cm from the incisors.       The hiatal narrowing was 38 cm from the  incisors.      Three 15 to 20 mm pedunculated polyps with no bleeding and stigmata of       recent bleeding were found in the gastric body. Preparations were made       for mucosal resection. A 1:100,000 solution of epinephrine was injected       to raise the lesions. Snare mucosal resection was performed. Resection       and retrieval were complete. To prevent bleeding post-intervention,       eight hemostatic clips were successfully placed (MR conditional) - 2 on       Distal Body #1, 3 on Distal body #2, 3 on Proximal Body #1. There was no       bleeding at the end of the procedure.      Four 8 to 12 mm semi-sessile polyps with no bleeding and stigmata of       recent bleeding were found in the gastric body. Preparations were made       for mucosal resection. Saline was injected to raise the lesions. Snare       mucosal resection was performed. Resection and retrieval were complete.       To prevent bleeding post-intervention, ten hemostatic clips were       successfully placed (MR conditional) - 2 on Middle Body #1, 1 on Middle       Body #2, 3 on Distal Body #3, 4 on Proximal Body #2. There was no       bleeding at the end of the procedure.      A single 30 mm pedunculated polyp with no bleeding was found in the       gastric body. Polypectomy was not attempted due to extensive resections       today. This can be entertained and may need a polyloop.      Multiple 3 to 6 mm semi-sessile polyps with no bleeding and stigmata of       recent bleeding were found in the cardia, in the gastric fundus and in       the gastric body.      No gross lesions were noted in the duodenal bulb, in the first portion       of the duodenum and in the second portion of the duodenum. Impression:               - No gross lesions in esophagus proximally.                           - Esophageal mucosal changes consistent with  short-segment Barrett's esophagus. Biopsied.                            - Medium-sized hiatal hernia.                           - Three pedunculated gastric polyps. Resected and                            retrieved. Clips (MR conditional) were placed.                           - Four semi-sessile gastric polyps. Resected and                            retrieved. Clips (MR conditional) were placed.                           - A single large pedunculated gastric polyp.                            Resection not attempted - query Polyloop in future..                           - Multiple gastric polyps noted but not removed.                           - No gross lesions in the duodenal bulb, in the                            first portion of the duodenum and in the second                            portion of the duodenum. Recommendation:           - The patient will be observed post-procedure,                            until all discharge criteria are met.                           - Discharge patient to home.                           - Observe patient's clinical course.                           - Full liquids + Soups today is reasonable. Then                            advance to soft diet tomorrow for 24 hours and then                            back to normal diet.                           -  Increase PPI to twice daily dosing for next                            45-month.                           - Await pathology results.                           - Will discuss with Dr. AHavery Moros patient has                            evidence of IDA likely from these hyperplastic                            oozing polyps. We certainly and consider another                            repeat EGD with removal of further polyps but he                            may be able to maintain his hemoglobin with Oral                            Iron and IV Iron infusions. We will update patient                            in follow up.                           - No  aspirin, ibuprofen, naproxen, or other                            non-steroidal anti-inflammatory drugs for 2 weeks.                           - Repeat upper endoscopy PRN for retreatment.                           - The findings and recommendations were discussed                            with the patient.                           - The findings and recommendations were discussed                            with the patient's family. Procedure Code(s):        --- Professional ---                           [phone] Esophagogastroduodenoscopy, flexible,  transoral; with endoscopic mucosal resection Diagnosis Code(s):        --- Professional ---                           K44.9, Diaphragmatic hernia without obstruction or                            gangrene                           K31.7, Polyp of stomach and duodenum                           D50.0, Iron deficiency anemia secondary to blood                            loss (chronic)                           K22.710, Barrett's esophagus with low grade                            dysplasia CPT copyright 2019 American Medical Association. All rights reserved. The codes documented in this report are preliminary and upon coder review may  be revised to meet current compliance requirements. Justice Britain, MD 01/16/2020 2:01:42 PM Number of Addenda: 0

## 2020-01-16 NOTE — Interval H&P Note (Signed)
History and Physical Interval Note:  01/16/2020 8:28 AM  Ian Rowe  has presented today for surgery, with the diagnosis of gastric polyp.  The various methods of treatment have been discussed with the patient and family. After consideration of risks, benefits and other options for treatment, the patient has consented to  Procedure(s): ESOPHAGOGASTRODUODENOSCOPY (EGD) WITH PROPOFOL (N/A) ENDOSCOPIC MUCOSAL RESECTION (N/A) as a surgical intervention.  The patient's history has been reviewed, patient examined, no change in status, stable for surgery.  I have reviewed the patient's chart and labs.  Questions were answered to the patient's satisfaction.     [employer]

## 2020-01-16 NOTE — Telephone Encounter (Signed)
Message routed to Ian Sax, NP due to PCP Hollace Kinnier L, DO being out of office. Please Advise.

## 2020-01-16 NOTE — Transfer of Care (Signed)
Immediate Anesthesia Transfer of Care Note  Patient: Ian Rowe  Procedure(s) Performed: ESOPHAGOGASTRODUODENOSCOPY (EGD) WITH PROPOFOL (N/A ) ENDOSCOPIC MUCOSAL RESECTION (N/A ) SUBMUCOSAL LIFTING INJECTION HEMOSTASIS CONTROL HEMOSTASIS CLIP PLACEMENT FOREIGN BODY REMOVAL  Patient Location: Endoscopy Unit  Anesthesia Type:MAC  Level of Consciousness: awake, alert  and oriented  Airway & Oxygen Therapy: Patient Spontanous Breathing and Patient connected to nasal cannula oxygen  Post-op Assessment: Report given to RN, Post -op Vital signs reviewed and stable and Patient moving all extremities X 4  Post vital signs: Reviewed and stable  Last Vitals:  Vitals Value Taken Time  BP 149/100 01/16/20 1341  Temp    Pulse 64 01/16/20 1343  Resp 13 01/16/20 1343  SpO2 100 % 01/16/20 1343  Vitals shown include unvalidated device data.  Last Pain:  Vitals:   01/16/20 1341  TempSrc:   PainSc: 0-No pain         Complications: No apparent anesthesia complications

## 2020-01-18 ENCOUNTER — Encounter: Payer: Self-pay | Admitting: Gastroenterology

## 2020-01-18 ENCOUNTER — Telehealth: Payer: Self-pay

## 2020-01-18 DIAGNOSIS — D649 Anemia, unspecified: Secondary | ICD-10-CM

## 2020-01-18 LAB — SURGICAL PATHOLOGY

## 2020-01-18 MED ORDER — FERROUS SULFATE 325 (65 FE) MG PO TABS: 325.0000 mg | ORAL_TABLET | Freq: Two times a day (BID) | ORAL | 3 refills | Status: DC

## 2020-01-18 NOTE — Telephone Encounter (Signed)
-----   Message from Yetta Flock, MD sent at 01/16/2020  4:24 PM EDT ----- Thanks [name], appreciate the help, yes he had quite a few polyps, thanks for taking care of it.  Given stable Hgb I think okay to wait a week and then place on oral iron if he wasn't already on it. We will coordinate all that and will see how he does. Thanks again.   Jan, can you call the patient and place order for ferrous sulfate 325mg  BID and ask him to start it in about one week. Please let him know this will make his stools dark when he takes it. I would like to repeat a CBC in about 3 weeks or so to ensure stable. Thanks   ----- Message ----- From: Irving Copas., MD Sent: 01/16/2020   3:12 PM EDT To: Yetta Flock, MD  [name], EGD completed today. Quite a stomach full of hyperplastic erosive polyps. I removed the largest ones as well as those that had evidence of recent bleeding or erosion.  There is still one large 1 in the proximal stomach that we can certainly consider in the future going after. I obtained a CBC as well as iron indices. These show evidence of a stable hemoglobin from 2 months ago but he has profound iron deficiency. I wanted you to have an opportunity to review this and decide upon how you want to approach oral iron as well as IV iron. I would hold on oral iron for at least a week so we can see and make sure that he does not have evidence of bleeding from my procedure today. We can certainly see what the final pathology shows and determine need for follow-up or repeat endoscopy with further resection based on how the patient responds to iron. Please let me know what I can be of assistance with. I will update you when the pathology returns. Thanks for the referral. [name]

## 2020-01-18 NOTE — Telephone Encounter (Signed)
Called and left a detailed message for pt that Dr. Havery Moros would like him to start ferrous sulfate 325 mg BID next week around 01-25-20. He will need CBC in 3 weeks. Order is in and pt should go to the lab around 5-25. Asked pt to call me back to discuss.

## 2020-01-19 NOTE — Telephone Encounter (Signed)
Patient called back.  Relayed recommendations.  He expressed understanding.

## 2020-01-25 ENCOUNTER — Non-Acute Institutional Stay: Payer: Medicare Other | Admitting: Internal Medicine

## 2020-01-25 ENCOUNTER — Encounter: Payer: Self-pay | Admitting: Internal Medicine

## 2020-01-25 ENCOUNTER — Telehealth: Payer: Self-pay

## 2020-01-25 ENCOUNTER — Telehealth: Payer: Self-pay | Admitting: Gastroenterology

## 2020-01-25 ENCOUNTER — Other Ambulatory Visit: Payer: Self-pay

## 2020-01-25 VITALS — BP 130/82 | HR 80 | Temp 97.1°F | Ht 67.0 in | Wt 174.1 lb

## 2020-01-25 DIAGNOSIS — R142 Eructation: Secondary | ICD-10-CM

## 2020-01-25 DIAGNOSIS — K2100 Gastro-esophageal reflux disease with esophagitis, without bleeding: Secondary | ICD-10-CM

## 2020-01-25 DIAGNOSIS — K22710 Barrett's esophagus with low grade dysplasia: Secondary | ICD-10-CM | POA: Diagnosis not present

## 2020-01-25 DIAGNOSIS — R739 Hyperglycemia, unspecified: Secondary | ICD-10-CM

## 2020-01-25 DIAGNOSIS — E782 Mixed hyperlipidemia: Secondary | ICD-10-CM | POA: Diagnosis not present

## 2020-01-25 DIAGNOSIS — D500 Iron deficiency anemia secondary to blood loss (chronic): Secondary | ICD-10-CM

## 2020-01-25 DIAGNOSIS — K317 Polyp of stomach and duodenum: Secondary | ICD-10-CM | POA: Diagnosis not present

## 2020-01-25 NOTE — Telephone Encounter (Signed)
Dr Havery Moros pt Dr Vernie Murders only did EMR

## 2020-01-25 NOTE — Telephone Encounter (Signed)
Message given to patient.

## 2020-01-25 NOTE — Telephone Encounter (Signed)
Conti, Pt was pt on Ferrous sulfate today and is having symptoms listed.

## 2020-01-25 NOTE — Telephone Encounter (Signed)
Dr Havery Moros the pt started ferrous sulfate today and states very shortly after he developed abd pain, belching and bloating.  He would like to know if she should continue taking the iron ?

## 2020-01-25 NOTE — Progress Notes (Signed)
Location:  Occupational psychologist of Service:  Clinic (12)  Provider: Janann Boeve L. Mariea Clonts, D.O., C.M.D.  Code Status: FULL CODE Goals of Care:  Advanced Directives 01/25/2020  Does Patient Have a Medical Advance Directive? -  Type of Advance Directive [place]  Does patient want to make changes to medical advance directive? -  Copy of [place] in Chart? Yes - validated most recent copy scanned in chart (See row information)  Pre-existing out of facility DNR order (yellow form or pink MOST form) -     Chief Complaint  Patient presents with  . Medical Management of Chronic Issues    6 month follow up    HPI: Patient is a 79 y.o. male seen today for medical management of chronic diseases.    Dr. Havery Moros did the endoscopy initially.  Then Dr. Rush Landmark did another to remove the polyps and biopsy the Barrett's esophagus.  Results were indeterminate. He was switched two twice a day omeprazole.   Continues with the belching and gas.  Happens two to three times a week after his second scotch--occurs for 2-3 mins continuously.   Dr. Havery Moros gave him iron to take with meals twice a day--to begin today (one week after the endoscopy).  He had no problems with bleeding after the endoscopy.    He thinks he had anemia as a young man and could not give blood.  His hgb was 12.1/38.7.    He's been taking zyrtec daily for a year--got 365 pills.  He thinks it is helping his allergies.  He's never been to an allergies, but he knows his eyes run, he has increased phlegm.  He takes it at bedtime.  He had his cataract surgery during the pandemic--one in march, second in may.  Everything is brighter.  Dr. Ellie Lunch did that surgery.  Last pressures were stable last week.    Discussed his wife's PD progressing a bit--she's been in a bad mood and not hearing as well.  She's not cycling now.    He is walking 30 mins per day.  Does sugar free  desserts 3-4 times per week, but has regular tiramisu.    Past Medical History:  Diagnosis Date  . Allergy    seasonal  . Arthritis    knees  . Cataract    BILATERAL REMOVED  . Gastric polyp   . GERD (gastroesophageal reflux disease)   . Glaucoma   . Hemorrhoids   . Hyperlipidemia   . Pneumonia    as teenager  . RBBB   . RBBB   . Vasovagal syncope    last episode was 2015-16; none since  . Wears glasses     Past Surgical History:  Procedure Laterality Date  . BIOPSY  01/16/2020   Procedure: BIOPSY;  Surgeon: Rush Landmark Telford Nab., MD;  Location: [place];  Service: Gastroenterology;;  . [place]  . CATARACT EXTRACTION W/ INTRAOCULAR LENS  IMPLANT, BILATERAL    . COLONOSCOPY  2002; 2007  . ENDOSCOPIC MUCOSAL RESECTION N/A 01/16/2020   Procedure: ENDOSCOPIC MUCOSAL RESECTION;  Surgeon: Rush Landmark Telford Nab., MD;  Location: [place];  Service: Gastroenterology;  Laterality: N/A;  . ESOPHAGOGASTRODUODENOSCOPY (EGD) WITH PROPOFOL N/A 01/16/2020   Procedure: ESOPHAGOGASTRODUODENOSCOPY (EGD) WITH PROPOFOL;  Surgeon: Rush Landmark Telford Nab., MD;  Location: [place];  Service: Gastroenterology;  Laterality: N/A;  . HEMOSTASIS CLIP PLACEMENT  01/16/2020   Procedure: HEMOSTASIS CLIP PLACEMENT;  Surgeon: Irving Copas.,  MD;  Location: [place];  Service: Gastroenterology;;  . HEMOSTASIS CONTROL  01/16/2020   Procedure: HEMOSTASIS CONTROL;  Surgeon: Irving Copas., MD;  Location: [place];  Service: Gastroenterology;;  . POLYPECTOMY    . SUBMUCOSAL LIFTING INJECTION  01/16/2020   Procedure: SUBMUCOSAL LIFTING INJECTION;  Surgeon: Rush Landmark Telford Nab., MD;  Location: [place];  Service: Gastroenterology;;  . Arnetha Courser TOOTH EXTRACTION      Allergies  Allergen Reactions  . Black Pepper [[place]] Other (See Comments)    bloating  . Ciprofloxacin Other (See Comments)    Bleeding-blood in stool   . Nsaids     History of bleeding      Outpatient Encounter Medications as of 01/25/2020  Medication Sig  . b complex vitamins tablet Take 1 tablet by mouth daily.  . bimatoprost (LUMIGAN) 0.01 % SOLN Place 1 drop into both eyes at bedtime.   . cetirizine (ZYRTEC) 10 MG tablet Take 10 mg by mouth daily as needed for allergies.   . Cholecalciferol (VITAMIN D) 2000 UNITS CAPS Take 1 capsule (2,000 Units total) by mouth daily.  . ferrous sulfate 325 (65 FE) MG tablet Take 1 tablet (325 mg total) by mouth 2 (two) times daily with a meal. Start on Wednesday, 01-25-20. This medication will make your stool dark.  . folic acid (FOLVITE) 809 MCG tablet Take 800 mcg by mouth daily.   . Glucosamine-Chondroit-Vit C-Mn (GLUCOSAMINE CHONDR 1500 COMPLX PO) Take 2 tablets by mouth daily.   . hydrocortisone cream 1 % Apply 1 application topically daily as needed for itching.  Marland Kitchen omeprazole (PRILOSEC) 40 MG capsule Take 1 capsule (40 mg total) by mouth 2 (two) times daily before a meal.  . rosuvastatin (CRESTOR) 5 MG tablet TAKE ONE TABLET BY MOUTH DAILY  . timolol (TIMOPTIC) 0.5 % ophthalmic solution Place 1 drop into both eyes daily.   Marland Kitchen triamcinolone (NASACORT ALLERGY 24HR) 55 MCG/ACT AERO nasal inhaler Place 2 sprays into the nose daily as needed (congestion).  . vitamin B-12 (CYANOCOBALAMIN) 1000 MCG tablet Take 1,000 mcg by mouth daily.   No facility-administered encounter medications on file as of 01/25/2020.    Review of Systems:  Review of Systems  Constitutional: Negative for chills, fever and malaise/fatigue.  HENT: Negative for congestion and hearing loss.        Some postnasal drip, watery eyes  Eyes: Negative for blurred vision.  Respiratory: Negative for cough and shortness of breath.   Cardiovascular: Negative for chest pain, palpitations and leg swelling.  Gastrointestinal: Negative for abdominal pain, blood in stool, constipation, diarrhea, heartburn, melena, nausea and vomiting.       Gas, severe belching after his second  scotch persists  Genitourinary: Negative for dysuria.  Musculoskeletal: Negative for falls and joint pain.  Skin: Negative for rash.  Neurological: Negative for dizziness and loss of consciousness.  Endo/Heme/Allergies: Positive for environmental allergies. Does not bruise/bleed easily.  Psychiatric/Behavioral: Negative for depression and memory loss. The patient is not nervous/anxious and does not have insomnia.     Health Maintenance  Topic Date Due  . COVID-19 Vaccine (1) Never done  . INFLUENZA VACCINE  04/15/2020  . TETANUS/TDAP  12/09/2023  . PNA vac Low Risk Adult  Completed    Physical Exam: Vitals:   01/25/20 0935  BP: 130/82  Pulse: 80  Temp: (!) 97.1 F (36.2 C)  SpO2: 97%  Weight: 174 lb 1.6 oz (79 kg)  Height: 5' 7"  (1.702 m)   Body mass index is  27.27 kg/m. Physical Exam Vitals reviewed.  Constitutional:      General: He is not in acute distress.    Appearance: Normal appearance. He is not ill-appearing or toxic-appearing.  HENT:     Head: Normocephalic and atraumatic.     Right Ear: External ear normal.     Left Ear: External ear normal.  Cardiovascular:     Rate and Rhythm: Normal rate and regular rhythm.     Pulses: Normal pulses.     Heart sounds: Normal heart sounds.  Neurological:     Mental Status: He is alert.     Labs reviewed: Basic Metabolic Panel: Recent Labs    07/21/19 0000  NA 141  K 4.9  BUN 13  CREATININE 1.1   Liver Function Tests: Recent Labs    07/21/19 0000  AST 25  ALT 15  ALKPHOS 77   No results for input(s): LIPASE, AMYLASE in the last 8760 hours. No results for input(s): AMMONIA in the last 8760 hours. CBC: Recent Labs    07/21/19 0000 11/23/19 1055 01/16/20 0900  WBC 8.3 6.9 6.6  HGB 13.0* 12.3* 12.1*  HCT 39* 37.3* 38.7*  MCV  --  83.4 86.4  PLT  --  262.0 260   Lipid Panel: Recent Labs    07/21/19 0000  CHOL 162  HDL 53  LDLCALC 75  TRIG 170*   Lab Results  Component Value Date    HGBA1C 6.2 07/21/2019    Procedures since last visit: Endoscopy 01/16/20 pathology: FINAL MICROSCOPIC DIAGNOSIS:  A. STOMACH, POLYPECTOMY:  - Gastric hyperplastic polyp(s) with ulceration  - Negative for intestinal metaplasia, dysplasia or malignancy  B. ESOPHAGUS, AT 34CM, BIOPSY:  - Barretts esophagus, negative for dysplasia  C. ESOPHAGUS, AT 33CM, BIOPSY:  - Barretts esophagus, negative for dysplasia  D. ESOPHAGUS, AT 32CM, BIOPSY:  - Barrett's esophagus, focally indefinite for dysplasia. See comment  COMMENT:  D. The Barrett's mucosa shows a few isolated glands with enlarged and  hyperchromatic nuclei, worrisome for high-grade dysplasia.  Immunohistochemical stain for p53 shows a single gland with clonal-like  overexpression pattern. But this focus is very small and maturation  towards the surface cannot be evaluated due to stripping of the surface  mucosa. Hence, the changes are interpreted as indefinite for dysplasia.   Assessment/Plan 1. Belching symptom -persists--unclear if due to his Barrett's/GERD as does not seem to change with PPI use  2. Gastroesophageal reflux disease with esophagitis without hemorrhage --cont bid PPI  3. Barrett's esophagus with low grade dysplasia -newly noted on endoscopy -Dr. Havery Moros and Dr. Rush Landmark are deciding about ablation potentially due to one area that is worrisome for "high-grade dysplasia" -cont PPI bid -did mention to him that ongoing 2 scotches nightly was increasing his risk of progression to malignancy so he was pondering this -he is curious how quickly the barrett's might progress--is it worth treating at his age--I did remind him of his excellent cognitive and functional status  4. Gastric polyps -s/p resection--benign hyperplastic  5. Hyperglycemia -f/u hba1c 5/27  6. Mixed hyperlipidemia -cont crestor, f/u lab 5/27  7. Iron deficiency with chronic blood loss -f/u cbc as planned by GI -cont iron  bid  Labs/tests ordered:  Cbc with diff (for GI), bmp, flp, hba1c on 5/27  Next appt:  6 mos med mgt  Margarie Mcguirt L. Rukaya Kleinschmidt, D.O. [facility] [address], [place] [address] Cell Phone (Mon-Fri 8am-5pm):  [phone] On Call:  [phone] & follow prompts after 5pm & weekends Office Phone:  [phone] Office Fax:  [phone]

## 2020-01-25 NOTE — Telephone Encounter (Signed)
Please send this to Dr Donneta Romberg nurse Koren Shiver. Thank you

## 2020-01-25 NOTE — Telephone Encounter (Signed)
This can sometimes cause GI upset / nausea, but should not cause pain, so a bit unusual. He may want to try another dose tomorrow and see how he feels. If it continues to happen and he does not tolerate it, he should stop it and will transition to IV iron if needed. Thanks

## 2020-01-25 NOTE — Telephone Encounter (Signed)
His nausea and indigestion should get better gradually as he gets used to taking it, but it admittedly is not the most well-tolerated medication around.  If he continues to have problems, GI may need to change him to an alternative version of the iron.

## 2020-01-25 NOTE — Telephone Encounter (Signed)
Message routed to PCP Reed, [place], DO.

## 2020-01-25 NOTE — Telephone Encounter (Signed)
Ian Rowe has a question about his new Iron medication that he started this am. He he took it with food and felt ok until about 11 am when he started have stomach issues, belching and feeling nauseated. He want to know if he should continue the medication or stop it. Please advise.

## 2020-01-26 NOTE — Telephone Encounter (Signed)
Patient notified of recommendations He states that he took it last night with no problems.  He took this am with his breakfast and not with his other vitamins and did not have any problems. He understands to call back if he has continued nausea or abdominal pain for alternative tx

## 2020-01-26 NOTE — Telephone Encounter (Signed)
Left message for patient to call back  

## 2020-01-30 ENCOUNTER — Encounter: Payer: Self-pay | Admitting: Gastroenterology

## 2020-02-01 ENCOUNTER — Encounter: Payer: Self-pay | Admitting: Internal Medicine

## 2020-02-01 NOTE — Telephone Encounter (Signed)
Message routed to PCP Reed, Tiffany L, DO . Please Advise.  

## 2020-02-09 DIAGNOSIS — K219 Gastro-esophageal reflux disease without esophagitis: Secondary | ICD-10-CM | POA: Diagnosis not present

## 2020-02-09 DIAGNOSIS — D649 Anemia, unspecified: Secondary | ICD-10-CM | POA: Diagnosis not present

## 2020-02-09 DIAGNOSIS — E785 Hyperlipidemia, unspecified: Secondary | ICD-10-CM | POA: Diagnosis not present

## 2020-02-09 LAB — CBC AND DIFFERENTIAL
HCT: 39 — AB (ref 41–53)
Hemoglobin: 13.5 (ref 13.5–17.5)
Platelets: 238 (ref 150–399)
WBC: 7.1

## 2020-02-09 LAB — CBC: RBC: 4.65 (ref 3.87–5.11)

## 2020-02-17 ENCOUNTER — Encounter: Payer: Self-pay | Admitting: Internal Medicine

## 2020-03-12 ENCOUNTER — Telehealth: Payer: Medicare Other | Admitting: Gastroenterology

## 2020-04-11 ENCOUNTER — Other Ambulatory Visit: Payer: Self-pay

## 2020-04-11 ENCOUNTER — Encounter: Payer: Self-pay | Admitting: Internal Medicine

## 2020-04-11 ENCOUNTER — Non-Acute Institutional Stay: Payer: Medicare Other | Admitting: Internal Medicine

## 2020-04-11 VITALS — BP 134/82 | HR 70 | Temp 97.7°F | Ht 67.0 in | Wt 168.6 lb

## 2020-04-11 DIAGNOSIS — K22710 Barrett's esophagus with low grade dysplasia: Secondary | ICD-10-CM | POA: Diagnosis not present

## 2020-04-11 DIAGNOSIS — M7022 Olecranon bursitis, left elbow: Secondary | ICD-10-CM | POA: Diagnosis not present

## 2020-04-11 DIAGNOSIS — R142 Eructation: Secondary | ICD-10-CM | POA: Diagnosis not present

## 2020-04-11 NOTE — Progress Notes (Signed)
Location:  [facility] clinic Provider: Blessyn [place] L. Mariea Clonts, D.O., C.M.D.  Code Status: FULL CODE Goals of Care:  Advanced Directives 04/11/2020  Does Patient Have a Medical Advance Directive? Yes  Type of Advance Directive [place]  Does patient want to make changes to medical advance directive? No - Patient declined  Copy of [place] in Chart? -  Pre-existing out of facility DNR order (yellow form or pink MOST form) -   Chief Complaint  Patient presents with  . Acute Visit    lump on elbow x 1 month     HPI: Patient is a 79 y.o. male seen today for an acute visit for left elbow swelling for one month.  He thinks he hit it on something at one point, but it took a while after that for it to swell.  Not very painful.  Still using it, but annoying b/c he can't do "the thinker" pose.  Still has his belching periodically for 20 mins almost consecutively once a month or so.  Had a bunch of polyps out.  Second endoscopy showed the negative for Barrett's then one indeterminant.  Televisit planned to discuss possible ablation got canceled and has not been rescheduled.  He is taking the ferrous sulfate.  He'd like to stop I--it causes terrible stomach painst.  He's also taking the omeprazole twice a day.    Past Medical History:  Diagnosis Date  . Allergy    seasonal  . Arthritis    knees  . Cataract    BILATERAL REMOVED  . Gastric polyp   . GERD (gastroesophageal reflux disease)   . Glaucoma   . Hemorrhoids   . Hyperlipidemia   . Pneumonia    as teenager  . RBBB   . RBBB   . Vasovagal syncope    last episode was 2015-16; none since  . Wears glasses     Past Surgical History:  Procedure Laterality Date  . BIOPSY  01/16/2020   Procedure: BIOPSY;  Surgeon: Rush Landmark Telford Nab., MD;  Location: [place];  Service: Gastroenterology;;  . [place]  . CATARACT EXTRACTION W/ INTRAOCULAR LENS  IMPLANT, BILATERAL    . COLONOSCOPY   2002; 2007  . ENDOSCOPIC MUCOSAL RESECTION N/A 01/16/2020   Procedure: ENDOSCOPIC MUCOSAL RESECTION;  Surgeon: Rush Landmark Telford Nab., MD;  Location: [place];  Service: Gastroenterology;  Laterality: N/A;  . ESOPHAGOGASTRODUODENOSCOPY (EGD) WITH PROPOFOL N/A 01/16/2020   Procedure: ESOPHAGOGASTRODUODENOSCOPY (EGD) WITH PROPOFOL;  Surgeon: Rush Landmark Telford Nab., MD;  Location: [place];  Service: Gastroenterology;  Laterality: N/A;  . HEMOSTASIS CLIP PLACEMENT  01/16/2020   Procedure: HEMOSTASIS CLIP PLACEMENT;  Surgeon: Irving Copas., MD;  Location: [place];  Service: Gastroenterology;;  . HEMOSTASIS CONTROL  01/16/2020   Procedure: HEMOSTASIS CONTROL;  Surgeon: Irving Copas., MD;  Location: [place];  Service: Gastroenterology;;  . POLYPECTOMY    . SUBMUCOSAL LIFTING INJECTION  01/16/2020   Procedure: SUBMUCOSAL LIFTING INJECTION;  Surgeon: Rush Landmark Telford Nab., MD;  Location: [place];  Service: Gastroenterology;;  . Arnetha Courser TOOTH EXTRACTION      Allergies  Allergen Reactions  . Black Pepper [[place]] Other (See Comments)    bloating  . Ciprofloxacin Other (See Comments)    Bleeding-blood in stool   . Nsaids     History of bleeding    Outpatient Encounter Medications as of 04/11/2020  Medication Sig  . b complex vitamins tablet Take 1 tablet by mouth daily.  Marland Kitchen  bimatoprost (LUMIGAN) 0.01 % SOLN Place 1 drop into both eyes at bedtime.   . cetirizine (ZYRTEC) 10 MG tablet Take 10 mg by mouth daily as needed for allergies.   . Cholecalciferol (VITAMIN D) 2000 UNITS CAPS Take 1 capsule (2,000 Units total) by mouth daily.  . ferrous sulfate 325 (65 FE) MG tablet Take 1 tablet (325 mg total) by mouth 2 (two) times daily with a meal. Start on Wednesday, 01-25-20. This medication will make your stool dark.  . folic acid (FOLVITE) 408 MCG tablet Take 800 mcg by mouth daily.   . Glucosamine-Chondroit-Vit C-Mn (GLUCOSAMINE CHONDR 1500 COMPLX PO) Take 2 tablets  by mouth daily.   . hydrocortisone cream 1 % Apply 1 application topically daily as needed for itching.  Marland Kitchen omeprazole (PRILOSEC) 40 MG capsule Take 1 capsule (40 mg total) by mouth 2 (two) times daily before a meal.  . rosuvastatin (CRESTOR) 5 MG tablet TAKE ONE TABLET BY MOUTH DAILY  . timolol (TIMOPTIC) 0.5 % ophthalmic solution Place 1 drop into both eyes daily.   Marland Kitchen triamcinolone (NASACORT ALLERGY 24HR) 55 MCG/ACT AERO nasal inhaler Place 2 sprays into the nose daily as needed (congestion).  . vitamin B-12 (CYANOCOBALAMIN) 1000 MCG tablet Take 1,000 mcg by mouth daily.   No facility-administered encounter medications on file as of 04/11/2020.    Review of Systems:  Review of Systems  Constitutional: Negative for chills, fever and malaise/fatigue.  HENT: Negative for congestion.   Eyes: Negative for blurred vision.       Glasses  Respiratory: Negative for cough and shortness of breath.   Cardiovascular: Negative for chest pain, palpitations and leg swelling.  Gastrointestinal: Negative for abdominal pain, blood in stool, constipation, diarrhea, heartburn, melena, nausea and vomiting.       Still having his excessive belching (he was going to record for me)  Genitourinary: Negative for dysuria.  Musculoskeletal: Positive for joint pain. Negative for back pain, falls, myalgias and neck pain.       Very mild left elbow pain, but prominent fluid collection  Neurological: Negative for dizziness and loss of consciousness.  Psychiatric/Behavioral: Negative for depression and memory loss. The patient is not nervous/anxious and does not have insomnia.     Health Maintenance  Topic Date Due  . INFLUENZA VACCINE  04/15/2020  . TETANUS/TDAP  12/09/2023  . COVID-19 Vaccine  Completed  . PNA vac Low Risk Adult  Completed    Physical Exam: Vitals:   04/11/20 1506  BP: (!) 134/82  Pulse: 70  Temp: 97.7 F (36.5 C)  TempSrc: Temporal  SpO2: 94%  Weight: 168 lb 9.6 oz (76.5 kg)  Height:  5\' 7"  (1.702 m)   Body mass index is 26.41 kg/m. Physical Exam Vitals reviewed.  Constitutional:      General: He is not in acute distress.    Appearance: Normal appearance. He is not toxic-appearing.  HENT:     Head: Normocephalic and atraumatic.  Eyes:     Comments: glasses  Cardiovascular:     Rate and Rhythm: Normal rate and regular rhythm.  Pulmonary:     Effort: Pulmonary effort is normal.     Breath sounds: Normal breath sounds.  Abdominal:     General: Bowel sounds are normal.  Musculoskeletal:        General: Swelling and tenderness present. Normal range of motion.     Right lower leg: No edema.     Left lower leg: No edema.  Comments: Mild tenderness, about 1 inch round fluid-filled sac on end of left elbow, fluctuant, no erythema, mild warmth  Skin:    General: Skin is warm and dry.  Neurological:     General: No focal deficit present.     Mental Status: He is alert and oriented to person, place, and time.  Psychiatric:        Mood and Affect: Mood normal.        Behavior: Behavior normal.        Thought Content: Thought content normal.        Judgment: Judgment normal.     Labs reviewed: Basic Metabolic Panel: Recent Labs    07/21/19 0000  NA 141  K 4.9  BUN 13  CREATININE 1.1   Liver Function Tests: Recent Labs    07/21/19 0000  AST 25  ALT 15  ALKPHOS 77   No results for input(s): LIPASE, AMYLASE in the last 8760 hours. No results for input(s): AMMONIA in the last 8760 hours. CBC: Recent Labs    11/23/19 1055 01/16/20 0900 02/09/20 0000  WBC 6.9 6.6 7.1  HGB 12.3* 12.1* 13.5  HCT 37.3* 38.7* 39*  MCV 83.4 86.4  --   PLT 262.0 260 238   Lipid Panel: Recent Labs    07/21/19 0000  CHOL 162  HDL 53  LDLCALC 75  TRIG 170*   Lab Results  Component Value Date   HGBA1C 6.2 07/21/2019    Assessment/Plan 1. Olecranon bursitis, left elbow -left elbow was cleansed x 2 with betadine -then sprayed with bupicaine  anesthetic -needle with large syringe used to aspirate about 2 cc of serosanguinous fluid from the left elbow -when it seemed to be full drained, bandaid was applied -if fluid reaccumulates or new symptoms develop, recommend rheum visit so fluid can be tested for crystals in case it's gouty but seems to be post-traumatic in etiology  2. Belching symptom -ongoing, he's going to record it because it is so annoying for him  3. Barrett's esophagus with low grade dysplasia -one area was more concerning so f/u was recommended with GI but appt got canceled by the office, he has not heard back -advised him to call [facility]  Labs/tests ordered:  F/u GI Next appt:  07/10/2020  Ruchel Brandenburger L. Arnett Galindez, D.O. [facility] [address] [place], [place] [address] Cell Phone (Mon-Fri 8am-5pm):  [phone] On Call:  [phone] & follow prompts after 5pm & weekends Office Phone:  [phone] Office Fax:  [phone]

## 2020-04-12 ENCOUNTER — Encounter: Payer: Self-pay | Admitting: Internal Medicine

## 2020-04-12 DIAGNOSIS — E785 Hyperlipidemia, unspecified: Secondary | ICD-10-CM | POA: Diagnosis not present

## 2020-04-12 DIAGNOSIS — R739 Hyperglycemia, unspecified: Secondary | ICD-10-CM | POA: Diagnosis not present

## 2020-04-12 DIAGNOSIS — K219 Gastro-esophageal reflux disease without esophagitis: Secondary | ICD-10-CM | POA: Diagnosis not present

## 2020-04-12 DIAGNOSIS — E119 Type 2 diabetes mellitus without complications: Secondary | ICD-10-CM | POA: Diagnosis not present

## 2020-04-12 DIAGNOSIS — D649 Anemia, unspecified: Secondary | ICD-10-CM | POA: Diagnosis not present

## 2020-04-12 LAB — BASIC METABOLIC PANEL
BUN: 10 (ref 4–21)
CO2: 26 — AB (ref 13–22)
Chloride: 99 (ref 99–108)
Creatinine: 1.1 (ref 0.6–1.3)
Glucose: 106
Potassium: 4.1 (ref 3.4–5.3)
Sodium: 138 (ref 137–147)

## 2020-04-12 LAB — CBC AND DIFFERENTIAL
HCT: 46 (ref 41–53)
Hemoglobin: 15.5 (ref 13.5–17.5)
Platelets: 223 (ref 150–399)
WBC: 6.7

## 2020-04-12 LAB — CBC: RBC: 5.03 (ref 3.87–5.11)

## 2020-04-12 LAB — HEMOGLOBIN A1C: Hemoglobin A1C: 6

## 2020-04-12 LAB — COMPREHENSIVE METABOLIC PANEL: Calcium: 9.2 (ref 8.7–10.7)

## 2020-04-17 ENCOUNTER — Other Ambulatory Visit: Payer: Self-pay | Admitting: Internal Medicine

## 2020-04-17 ENCOUNTER — Encounter: Payer: Self-pay | Admitting: Internal Medicine

## 2020-04-17 DIAGNOSIS — M7022 Olecranon bursitis, left elbow: Secondary | ICD-10-CM

## 2020-04-17 MED ORDER — MELOXICAM 7.5 MG PO TABS: 7.5000 mg | ORAL_TABLET | Freq: Every day | ORAL | 0 refills | Status: DC

## 2020-04-18 ENCOUNTER — Telehealth: Payer: Self-pay

## 2020-04-18 ENCOUNTER — Encounter: Payer: Self-pay | Admitting: Internal Medicine

## 2020-04-18 NOTE — Telephone Encounter (Signed)
Per Dr. Mariea Clonts blood counts and iron are normal. Ok to stop iron. Electrolytes, kidneys ok. Sugar average is prediabetic range at 6

## 2020-04-24 ENCOUNTER — Encounter: Payer: Self-pay | Admitting: Internal Medicine

## 2020-04-24 ENCOUNTER — Other Ambulatory Visit: Payer: Self-pay | Admitting: Internal Medicine

## 2020-04-24 DIAGNOSIS — M7022 Olecranon bursitis, left elbow: Secondary | ICD-10-CM

## 2020-04-24 MED ORDER — OMEPRAZOLE 40 MG PO CPDR: 40.0000 mg | DELAYED_RELEASE_CAPSULE | Freq: Two times a day (BID) | ORAL | 3 refills | Status: DC

## 2020-04-24 MED ORDER — MELOXICAM 7.5 MG PO TABS: 7.5000 mg | ORAL_TABLET | Freq: Every day | ORAL | 0 refills | Status: AC

## 2020-04-24 NOTE — Telephone Encounter (Signed)
Message Routed to Dr. Reed MD 

## 2020-05-14 ENCOUNTER — Encounter: Payer: Self-pay | Admitting: Internal Medicine

## 2020-05-14 MED ORDER — MELOXICAM 7.5 MG PO TABS
7.5000 mg | ORAL_TABLET | Freq: Every day | ORAL | 0 refills | Status: DC
Start: 2020-05-14 — End: 2020-07-10

## 2020-05-30 IMAGING — MR MRI OF THE RIGHT SHOULDER WITHOUT CONTRAST
5 series · 34 of 40 positions shown · non-contrast
Comparison: None.

CLINICAL DATA: Lateral right shoulder pain and limited range of
motion for 6 months. No injury.

EXAM:
MRI OF THE RIGHT SHOULDER WITHOUT CONTRAST
TECHNIQUE: Multiplanar, multisequence MR imaging of the shoulder was performed.
No intravenous contrast was administered.

[Series 3: PD fat-sat · axial · 4.0mm · 0.55mm/px · z∈[-12,+87]mm · 8 of 24 slices shown (1 of 2)]
[im 1/24]
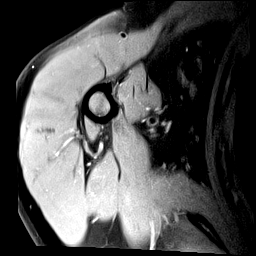
[im 3/24]
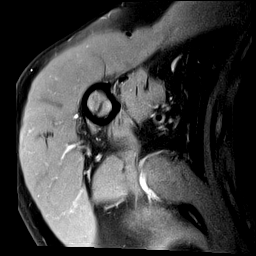
[im 8/24]
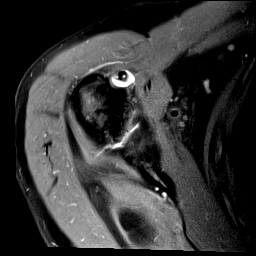
[im 11/24]
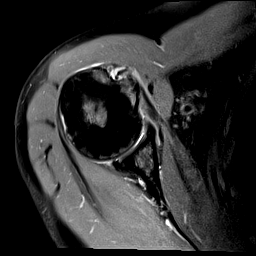
[im 13/24]
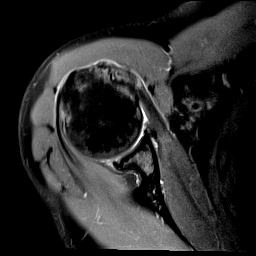
[im 16/24]
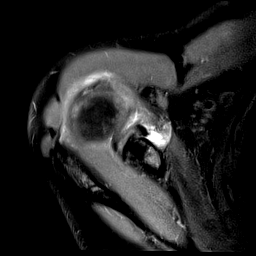
[im 21/24]
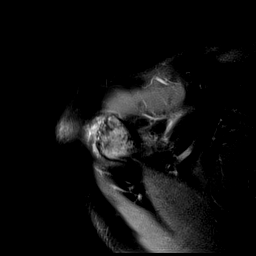
[im 24/24]
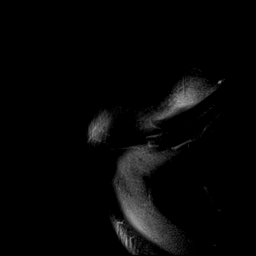

[Series 4: T2 fat-sat · oblique · 4.0mm · 0.55mm/px · 7 of 19 slices shown (1 of 2)]
[im 1/19]
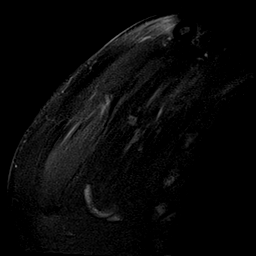
[im 4/19]
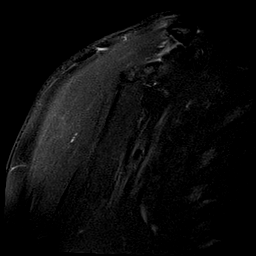
[im 7/19]
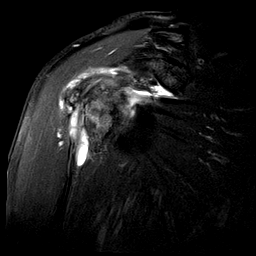
[im 10/19]
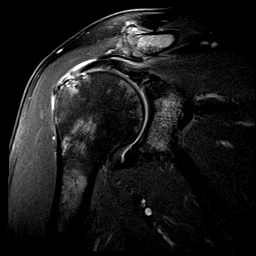
[im 13/19]
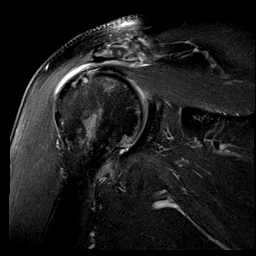
[im 16/19]
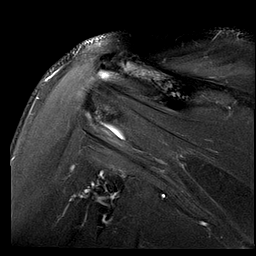
[im 19/19]
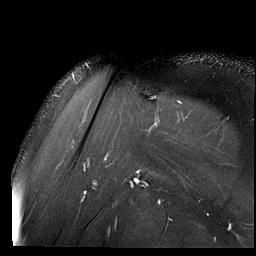

[Series 5: PD fat-sat · oblique · 4.0mm · 0.27mm/px · 7 of 18 slices shown (2 of 2)]
[im 1/18]
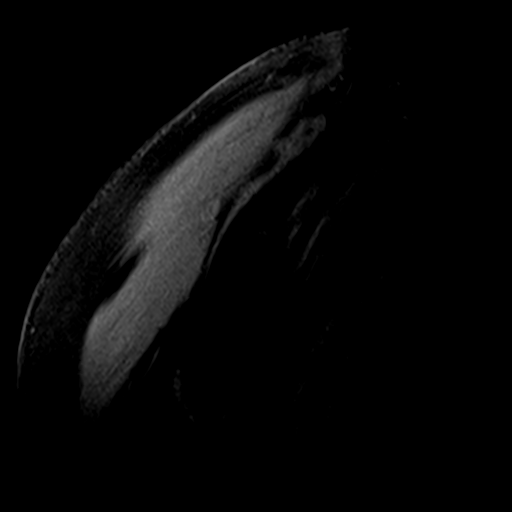
[im 3/18]
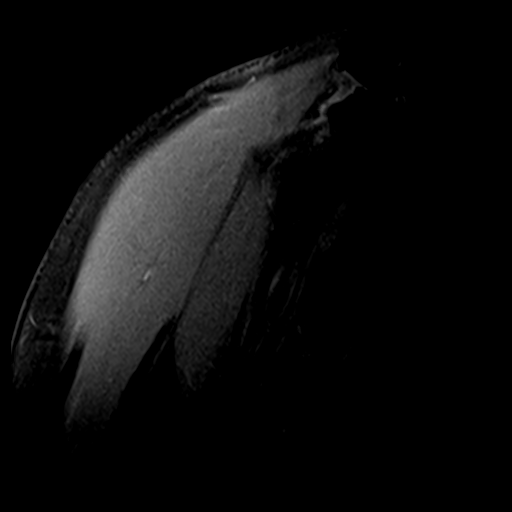
[im 6/18]
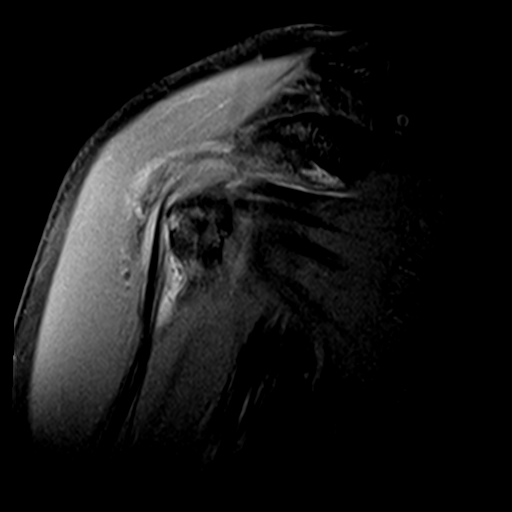
[im 9/18]
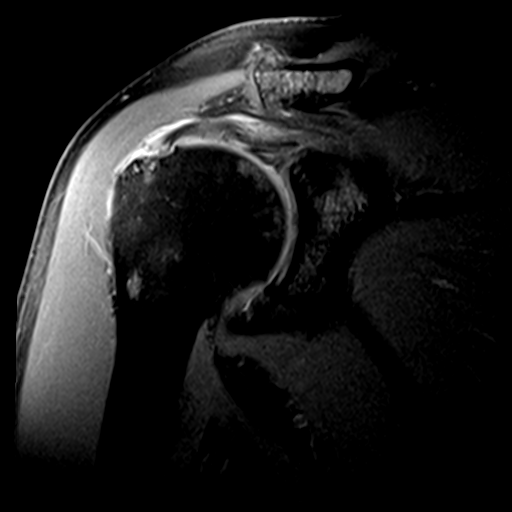
[im 12/18]
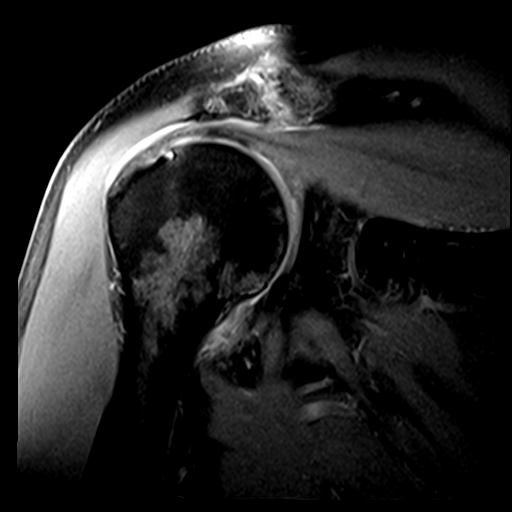
[im 15/18]
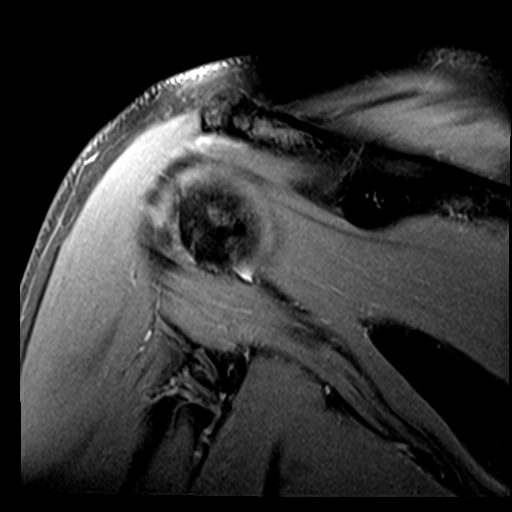
[im 18/18]
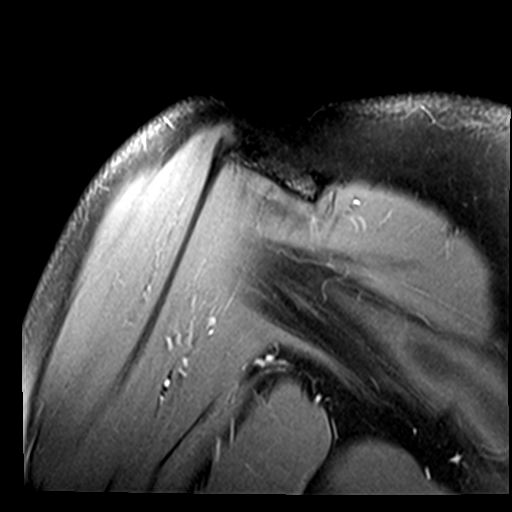

[Series 6: T1 · oblique · 4.0mm · 0.27mm/px · 4 of 22 slices shown]
[im 1/22]
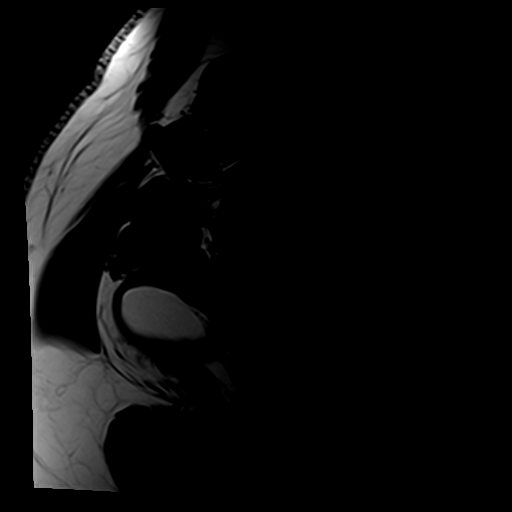
[im 4/22]
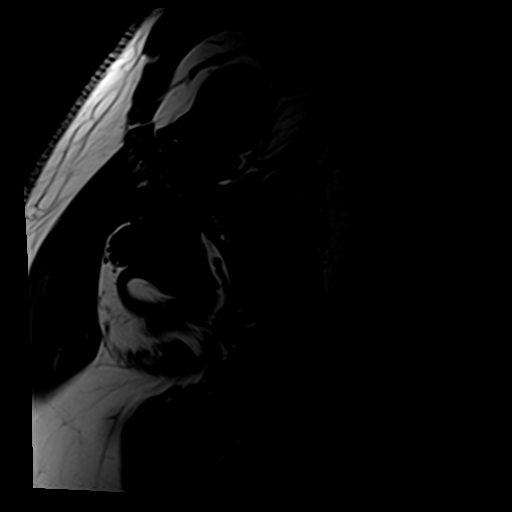
[im 7/22]
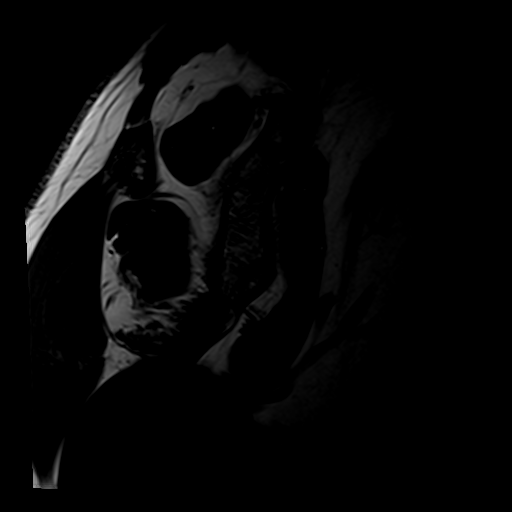
[im 10/22]
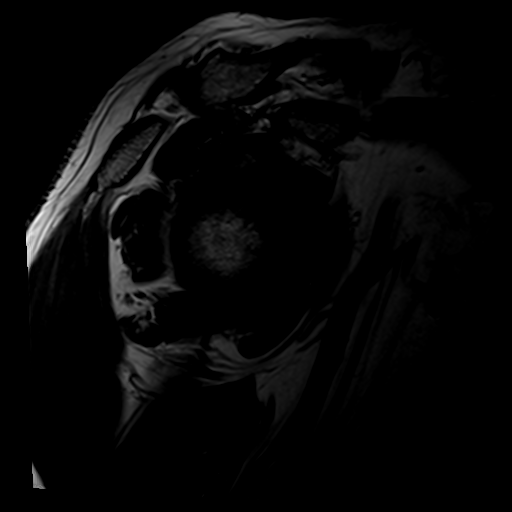

[Series 7: T2 fat-sat · oblique · 4.0mm · 0.55mm/px · 8 of 22 slices shown (2 of 2)]
[im 1/22]
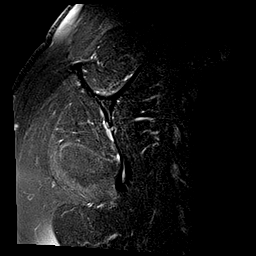
[im 4/22]
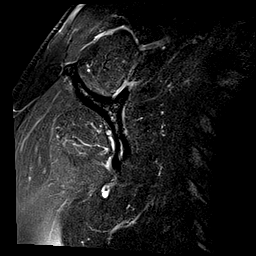
[im 7/22]
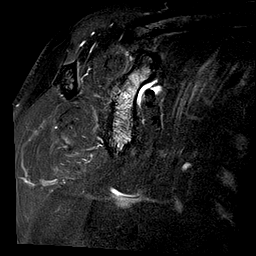
[im 10/22]
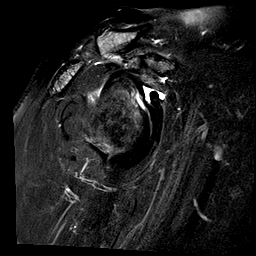
[im 13/22]
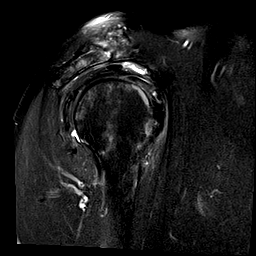
[im 16/22]
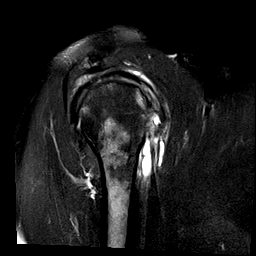
[im 19/22]
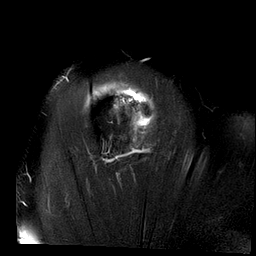
[im 22/22]
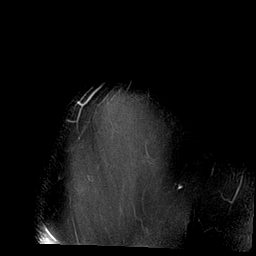

[34 of 40 positions shown; findings below may reference images not displayed]

FINDINGS: The patient has rotator cuff tendon scratch none.

Rotator cuff: The patient has rotator cuff tendinopathy. Partial
width, full-thickness tear of the far lateral supraspinatus measures
approximately 0.5 cm from front to back and is just posterior to the
leading edge of the tendon. No retraction. The rotator cuff is
otherwise intact.

Muscles: No atrophy. There is a T1 hyperintense lesion in the teres
minor which measures approximately 2.4 cm AP x 1.2 cm craniocaudal x
at least 6 cm transverse. The lesion is incompletely imaged.
Visualized portion of the lesion demonstrates complete signal
dropout on T2 weighted imaging.

Biceps long head: Intrasubstance increased T2 signal is seen in both
the intra and extra-articular segments consistent with tendinosis.
There is some fluid and synovial thickening about the tendon in the
bicipital groove.

Acromioclavicular Joint: Advanced degenerative change is present
with marrow edema about the joint. Type 2 acromion. There is some
fluid in the subacromial/subdeltoid bursa. Mild subacromial spurring
also noted.

Glenohumeral Joint: Negative.

Labrum:  Intact.  Ini complex noted.

Bones:  No fracture or worrisome lesion.

Other: None.
IMPRESSION: Rotator cuff tendinopathy with a 0.5 cm from front to back
full-thickness tear of the far lateral supraspinatus. No retraction
or atrophy.

Tendinopathy of both the intra and extra-articular long head of
biceps.

Bulky acromioclavicular osteoarthritis with marrow edema about the
joint.

Subacromial/subdeltoid bursitis.

Partial visualization a lipomatous tumor in the teres minor. Imaged
portion of the tumor is consistent a simple lipoma.

## 2020-06-28 DIAGNOSIS — Z23 Encounter for immunization: Secondary | ICD-10-CM | POA: Diagnosis not present

## 2020-07-10 ENCOUNTER — Encounter: Payer: Self-pay | Admitting: Nurse Practitioner

## 2020-07-10 ENCOUNTER — Ambulatory Visit (INDEPENDENT_AMBULATORY_CARE_PROVIDER_SITE_OTHER): Payer: Medicare Other | Admitting: Nurse Practitioner

## 2020-07-10 ENCOUNTER — Other Ambulatory Visit: Payer: Self-pay

## 2020-07-10 ENCOUNTER — Telehealth: Payer: Self-pay

## 2020-07-10 DIAGNOSIS — H401132 Primary open-angle glaucoma, bilateral, moderate stage: Secondary | ICD-10-CM | POA: Diagnosis not present

## 2020-07-10 DIAGNOSIS — Z0000 Encounter for general adult medical examination without abnormal findings: Secondary | ICD-10-CM | POA: Diagnosis not present

## 2020-07-10 NOTE — Progress Notes (Signed)
This service is provided via telemedicine  No vital signs collected/recorded due to the encounter was a telemedicine visit.   Location of patient (ex: home, work):  Home  Patient consents to a telephone visit:  Yes, see encounter dated 07/10/2020  Location of the provider (ex: office, home):  [place]  Name of any referring provider:  Hollace Kinnier, DO  Names of all persons participating in the telemedicine service and their role in the encounter:  Sherrie Mustache, Nurse Practitioner, Carroll Kinds, CMA, and patient.   Time spent on call:  8 minutes with medical assistant.

## 2020-07-10 NOTE — Progress Notes (Signed)
Subjective:   Ian Rowe is a 79 y.o. male who presents for Medicare Annual/Subsequent preventive examination.  Review of Systems     Cardiac Risk Factors include: advanced age (>73men, >51 women);male gender;dyslipidemia;family history of premature cardiovascular disease     Objective:    There were no vitals filed for this visit. There is no height or weight on file to calculate BMI.  Advanced Directives 07/10/2020 04/11/2020 01/25/2020 01/16/2020 07/27/2019 07/07/2019 07/07/2019  Does Patient Have a Medical Advance Directive? Yes Yes - Yes Yes Yes Yes  Type of Industrial/product designer of [place];Living will Healthcare Power of [place];Living will [place];Living will  Does patient want to make changes to medical advance directive? No - Patient declined No - Patient declined - - No - Patient declined No - Patient declined No - Patient declined  Copy of [place] in Chart? Yes - validated most recent copy scanned in chart (See row information) - Yes - validated most recent copy scanned in chart (See row information) No - copy requested Yes - validated most recent copy scanned in chart (See row information) No - copy requested No - copy requested  Pre-existing out of facility DNR order (yellow form or pink MOST form) - - - - - - -    Current Medications (verified) Outpatient Encounter Medications as of 07/10/2020  Medication Sig  . b complex vitamins tablet Take 1 tablet by mouth daily.  . bimatoprost (LUMIGAN) 0.01 % SOLN Place 1 drop into both eyes at bedtime.   . cetirizine (ZYRTEC) 10 MG tablet Take 10 mg by mouth daily as needed for allergies.   . Cholecalciferol (VITAMIN D) 2000 UNITS CAPS Take 1 capsule (2,000 Units total) by mouth daily.  . folic acid (FOLVITE) 578 MCG tablet Take 800 mcg by mouth daily.   .  Glucosamine-Chondroit-Vit C-Mn (GLUCOSAMINE CHONDR 1500 COMPLX PO) Take 2 tablets by mouth daily.   . hydrocortisone cream 1 % Apply 1 application topically daily as needed for itching.  Marland Kitchen omeprazole (PRILOSEC) 40 MG capsule Take 1 capsule (40 mg total) by mouth 2 (two) times daily before a meal.  . rosuvastatin (CRESTOR) 5 MG tablet TAKE ONE TABLET BY MOUTH DAILY  . timolol (TIMOPTIC) 0.5 % ophthalmic solution Place 1 drop into both eyes daily.   Marland Kitchen triamcinolone (NASACORT ALLERGY 24HR) 55 MCG/ACT AERO nasal inhaler Place 2 sprays into the nose daily as needed (congestion).  . vitamin B-12 (CYANOCOBALAMIN) 1000 MCG tablet Take 1,000 mcg by mouth daily.  . [DISCONTINUED] ferrous sulfate 325 (65 FE) MG tablet Take 1 tablet (325 mg total) by mouth 2 (two) times daily with a meal. Start on Wednesday, 01-25-20. This medication will make your stool dark.  . [DISCONTINUED] meloxicam (MOBIC) 7.5 MG tablet Take 1 tablet (7.5 mg total) by mouth daily.   No facility-administered encounter medications on file as of 07/10/2020.    Allergies (verified) Black pepper [piper], Ciprofloxacin, and Nsaids   History: Past Medical History:  Diagnosis Date  . Allergy    seasonal  . Arthritis    knees  . Cataract    BILATERAL REMOVED  . Gastric polyp   . GERD (gastroesophageal reflux disease)   . Glaucoma   . Hemorrhoids   . Hyperlipidemia   . Pneumonia    as teenager  . RBBB   . RBBB   . Vasovagal  syncope    last episode was 2015-16; none since  . Wears glasses    Past Surgical History:  Procedure Laterality Date  . BIOPSY  01/16/2020   Procedure: BIOPSY;  Surgeon: Rush Landmark Telford Nab., MD;  Location: [facility];  Service: Gastroenterology;;  . [place]  . CATARACT EXTRACTION W/ INTRAOCULAR LENS  IMPLANT, BILATERAL    . COLONOSCOPY  2002; 2007  . ENDOSCOPIC MUCOSAL RESECTION N/A 01/16/2020   Procedure: ENDOSCOPIC MUCOSAL RESECTION;  Surgeon: Rush Landmark Telford Nab., MD;   Location: [facility];  Service: Gastroenterology;  Laterality: N/A;  . ESOPHAGOGASTRODUODENOSCOPY (EGD) WITH PROPOFOL N/A 01/16/2020   Procedure: ESOPHAGOGASTRODUODENOSCOPY (EGD) WITH PROPOFOL;  Surgeon: Rush Landmark Telford Nab., MD;  Location: [facility];  Service: Gastroenterology;  Laterality: N/A;  . HEMOSTASIS CLIP PLACEMENT  01/16/2020   Procedure: HEMOSTASIS CLIP PLACEMENT;  Surgeon: Irving Copas., MD;  Location: [facility];  Service: Gastroenterology;;  . HEMOSTASIS CONTROL  01/16/2020   Procedure: HEMOSTASIS CONTROL;  Surgeon: Irving Copas., MD;  Location: [facility];  Service: Gastroenterology;;  . POLYPECTOMY    . SUBMUCOSAL LIFTING INJECTION  01/16/2020   Procedure: SUBMUCOSAL LIFTING INJECTION;  Surgeon: Rush Landmark Telford Nab., MD;  Location[facility] ENDOSCOPY;  Service: Gastroenterology;;  . Arnetha Courser TOOTH EXTRACTION     Family History  Problem Relation Age of Onset  . Alzheimer's disease Mother   . Heart disease Father   . Colon cancer Neg Hx   . Colon polyps Neg Hx   . Esophageal cancer Neg Hx   . Stomach cancer Neg Hx   . Rectal cancer Neg Hx   . Inflammatory bowel disease Neg Hx   . Liver disease Neg Hx   . Pancreatic cancer Neg Hx    Social History   Socioeconomic History  . Marital status: Married    Spouse name: Mardene Celeste  . Number of children: 2  . Years of education: Not on file  . Highest education level: Not on file  Occupational History  . Occupation: Retired Clinical biochemist  Tobacco Use  . Smoking status: Never Smoker  . Smokeless tobacco: Never Used  Vaping Use  . Vaping Use: Never used  Substance and Sexual Activity  . Alcohol use: Yes    Alcohol/week: 10.0 standard drinks    Types: 7 Shots of liquor, 3 Glasses of wine per week    Comment: 2 scotch at bedtime nightly, occ glass of wine   . Drug use: No  . Sexual activity: Not on file  Other Topics Concern  . Not on file  Social History Narrative   Moved  to [place] 2014.   Married   Never smoked   Alcohol - scotch or wine nightly   Walks 150 minutes per week.    POA   Social Determinants of Health   Financial Resource Strain:   . Difficulty of Paying Living Expenses: Not on file  Food Insecurity:   . Worried About Charity fundraiser in the Last Year: Not on file  . Ran Out of Food in the Last Year: Not on file  Transportation Needs:   . Lack of Transportation (Medical): Not on file  . Lack of Transportation (Non-Medical): Not on file  Physical Activity:   . Days of Exercise per Week: Not on file  . Minutes of Exercise per Session: Not on file  Stress:   . Feeling of Stress : Not on file  Social Connections:   . Frequency of Communication with Friends and Family:  Not on file  . Frequency of Social Gatherings with Friends and Family: Not on file  . Attends Religious Services: Not on file  . Active Member of Clubs or Organizations: Not on file  . Attends Archivist Meetings: Not on file  . Marital Status: Not on file    Tobacco Counseling Counseling given: Not Answered   Clinical Intake:  Pre-visit preparation completed: No  Pain : No/denies pain     BMI - recorded: 26 Nutritional Status: BMI of 19-24  Normal Nutritional Risks: None  How often do you need to have someone help you when you read instructions, pamphlets, or other written materials from your doctor or pharmacy?: 1 - Never  Diabetic?no         Activities of Daily Living In your present state of health, do you have any difficulty performing the following activities: 07/10/2020  Hearing? N  Vision? N  Walking or climbing stairs? N  Dressing or bathing? N  Doing errands, shopping? N  Preparing Food and eating ? N  Using the Toilet? N  In the past six months, have you accidently leaked urine? N  Do you have problems with loss of bowel control? N  Managing your Medications? N  Managing your Finances? N  Housekeeping or managing  your Housekeeping? N  Some recent data might be hidden    Patient Care Team: Gayland Curry, DO as PCP - General (Geriatric Medicine) [facility] Armbruster, Carlota Raspberry, MD as Consulting Physician (Gastroenterology) Mansouraty, Telford Nab., MD as Consulting Physician (Gastroenterology)  Indicate any recent Medical Services you may have received from other than [facility] providers in the past year (date may be approximate).     Assessment:   This is a routine wellness examination for Ian Rowe.  Hearing/Vision screen  Hearing Screening   125Hz  250Hz  500Hz  1000Hz  2000Hz  3000Hz  4000Hz  6000Hz  8000Hz   Right ear:           Left ear:           Comments: Patient states he has no hearing problems.  Vision Screening Comments: Patient has glaucoma. Patient has eye appointment scheduled this afternoon 07/10/2020.  Dietary issues and exercise activities discussed: Current Exercise Habits: Home exercise routine, Type of exercise: walking;calisthenics;stretching, Time (Minutes): 30  Goals    . Exercise 210 min/wk Moderate Activity     30 mins/ 7 days a week of walkin    . Patient Stated     To figure out why he is belching       Depression Screen PHQ 2/9 Scores 07/10/2020 04/11/2020 07/27/2019 07/07/2019 01/19/2019 12/08/2018 07/21/2018  PHQ - 2 Score 0 0 0 0 0 0 0    Fall Risk Fall Risk  07/10/2020 04/11/2020 01/25/2020 07/27/2019 07/07/2019  Falls in the past year? 0 0 0 0 0  Number falls in past yr: 0 0 0 - -  Injury with Fall? 0 0 0 - -    Any stairs in or around the home? No  If so, are there any without handrails? No  Home free of loose throw rugs in walkways, pet beds, electrical cords, etc? Yes  Adequate lighting in your home to reduce risk of falls? Yes   ASSISTIVE DEVICES UTILIZED TO PREVENT FALLS:  Life alert? Yes  Use of a cane, walker or w/c? No  Grab bars in the bathroom? Yes  Shower chair or bench in shower? Yes  Elevated toilet seat or a handicapped  toilet? Yes  TIMED UP AND GO:  Was the test performed? No .   Cognitive Function: MMSE - Mini Mental State Exam 01/12/2018 12/31/2016 06/27/2015  Orientation to time 5 5 5   Orientation to Place 5 5 5   Registration 3 3 3   Attention/ Calculation 5 5 5   Recall 3 3 3   Language- name 2 objects 2 2 2   Language- repeat 1 1 1   Language- follow 3 step command 3 3 3   Language- read & follow direction 1 1 1   Write a sentence 1 1 1   Copy design 1 1 1   Total score 30 30 30      6CIT Screen 07/10/2020 07/07/2019  What Year? 0 points 0 points  What month? 0 points 0 points  What time? 0 points 0 points  Count back from 20 0 points 0 points  Months in reverse 0 points 0 points  Repeat phrase 0 points 0 points  Total Score 0 0    Immunizations Immunization History  Administered Date(s) Administered  . Influenza Whole 06/15/2012, 06/15/2013  . Influenza, High Dose Seasonal PF 06/19/2016  . Influenza, Quadrivalent, Recombinant, Inj, Pf 06/16/2019  . Influenza,inj,Quad PF,6+ Mos 06/15/2018  . Influenza-Unspecified 06/14/2014, 06/21/2015, 06/30/2017, 06/28/2020  . Moderna SARS-COVID-2 Vaccination 09/27/2019, 10/26/2019  . Pneumococcal Conjugate-13 10/31/2014  . Pneumococcal Polysaccharide-23 09/16/2011  . Tdap 12/08/2013  . Zoster Recombinat (Shingrix) 08/25/2017, 11/02/2017    TDAP status: Up to date Flu Vaccine status: Up to date Pneumococcal vaccine status: Up to date Covid-19 vaccine status: Completed vaccines  Qualifies for Shingles Vaccine? Yes   Zostavax completed Yes   Shingrix Completed?: Yes  Screening Tests Health Maintenance  Topic Date Due  . TETANUS/TDAP  12/09/2023  . INFLUENZA VACCINE  Completed  . COVID-19 Vaccine  Completed  . PNA vac Low Risk Adult  Completed    Health Maintenance  There are no preventive care reminders to display for this patient.  Colorectal cancer screening: No longer required.   Lung Cancer Screening: (Low Dose CT Chest  recommended if Age 42-80 years, 30 pack-year currently smoking OR have quit w/in 15years.) does not qualify.   Lung Cancer Screening Referral: na  Additional Screening:  Hepatitis C Screening: does not qualify; Completed na  Vision Screening: Recommended annual ophthalmology exams for early detection of glaucoma and other disorders of the eye. Is the patient up to date with their annual eye exam?  Yes  Who is the provider or what is the name of the office in which the patient attends annual eye exams? [facility] ophthalmology If pt is not established with a provider, would they like to be referred to a provider to establish care? No .   Dental Screening: Recommended annual dental exams for proper oral hygiene  Community Resource Referral / Chronic Care Management: CRR required this visit?  No   CCM required this visit?  No      Plan:     I have personally reviewed and noted the following in the patient's chart:   . Medical and social history . Use of alcohol, tobacco or illicit drugs  . Current medications and supplements . Functional ability and status . Nutritional status . Physical activity . Advanced directives . List of other physicians . Hospitalizations, surgeries, and ER visits in previous 12 months . Vitals . Screenings to include cognitive, depression, and falls . Referrals and appointments  In addition, I have reviewed and discussed with patient certain preventive protocols, quality metrics, and best practice recommendations. A written personalized  care plan for preventive services as well as general preventive health recommendations were provided to patient.     Lauree Chandler, NP   07/10/2020    Virtual Visit via Telephone Note  I connected with@ on 07/10/20 at 10:30 AM EDT by telephone and verified that I am speaking with the correct person using two identifiers.  Location: Patient: home Provider: [facility]   I discussed the limitations, risks,  security and privacy concerns of performing an evaluation and management service by telephone and the availability of in person appointments. I also discussed with the patient that there may be a patient responsible charge related to this service. The patient expressed understanding and agreed to proceed.   I discussed the assessment and treatment plan with the patient. The patient was provided an opportunity to ask questions and all were answered. The patient agreed with the plan and demonstrated an understanding of the instructions.   The patient was advised to call back or seek an in-person evaluation if the symptoms worsen or if the condition fails to improve as anticipated.  I provided 15 minutes of non-face-to-face time during this encounter.  Carlos [employer]. Harle Battiest Avs printed and mailed

## 2020-07-10 NOTE — Patient Instructions (Signed)
[name] , Thank you for taking time to come for your Medicare Wellness Visit. I appreciate your ongoing commitment to your health goals. Please review the following plan we discussed and let me know if I can assist you in the future.   Screening recommendations/referrals: Colonoscopy aged out. Recommended yearly ophthalmology/optometry visit for glaucoma screening and checkup Recommended yearly dental visit for hygiene and checkup  Vaccinations: Influenza vaccine up to date Pneumococcal vaccine up to date Tdap vaccine up to date Shingles vaccine up to date     Advanced directives: on file.   Conditions/risks identified: cardiovascular risk, advanced age.   Next appointment: 1 year.   Preventive Care 12 Years and Older, Male Preventive care refers to lifestyle choices and visits with your health care provider that can promote health and wellness. What does preventive care include?  A yearly physical exam. This is also called an annual well check.  Dental exams once or twice a year.  Routine eye exams. Ask your health care provider how often you should have your eyes checked.  Personal lifestyle choices, including:  Daily care of your teeth and gums.  Regular physical activity.  Eating a healthy diet.  Avoiding tobacco and drug use.  Limiting alcohol use.  Practicing safe sex.  Taking low doses of aspirin every day.  Taking vitamin and mineral supplements as recommended by your health care provider. What happens during an annual well check? The services and screenings done by your health care provider during your annual well check will depend on your age, overall health, lifestyle risk factors, and family history of disease. Counseling  Your health care provider may ask you questions about your:  Alcohol use.  Tobacco use.  Drug use.  Emotional well-being.  Home and relationship well-being.  Sexual activity.  Eating habits.  History of  falls.  Memory and ability to understand (cognition).  Work and work Statistician. Screening  You may have the following tests or measurements:  Height, weight, and BMI.  Blood pressure.  Lipid and cholesterol levels. These may be checked every 5 years, or more frequently if you are over 77 years old.  Skin check.  Lung cancer screening. You may have this screening every year starting at age 12 if you have a 30-pack-year history of smoking and currently smoke or have quit within the past 15 years.  Fecal occult blood test (FOBT) of the stool. You may have this test every year starting at age 73.  Flexible sigmoidoscopy or colonoscopy. You may have a sigmoidoscopy every 5 years or a colonoscopy every 10 years starting at age 81.  Prostate cancer screening. Recommendations will vary depending on your family history and other risks.  Hepatitis C blood test.  Hepatitis B blood test.  Sexually transmitted disease (STD) testing.  Diabetes screening. This is done by checking your blood sugar (glucose) after you have not eaten for a while (fasting). You may have this done every 1-3 years.  Abdominal aortic aneurysm (AAA) screening. You may need this if you are a current or former smoker.  Osteoporosis. You may be screened starting at age 24 if you are at high risk. Talk with your health care provider about your test results, treatment options, and if necessary, the need for more tests. Vaccines  Your health care provider may recommend certain vaccines, such as:  Influenza vaccine. This is recommended every year.  Tetanus, diphtheria, and acellular pertussis (Tdap, Td) vaccine. You may need a Td booster every 10 years.  Zoster vaccine. You may need this after age 91.  Pneumococcal 13-valent conjugate (PCV13) vaccine. One dose is recommended after age 67.  Pneumococcal polysaccharide (PPSV23) vaccine. One dose is recommended after age 54. Talk to your health care provider about  which screenings and vaccines you need and how often you need them. This information is not intended to replace advice given to you by your health care provider. Make sure you discuss any questions you have with your health care provider. Document Released: 09/28/2015 Document Revised: 05/21/2016 Document Reviewed: 07/03/2015 Elsevier Interactive Patient Education  2017 [place] Prevention in the Home Falls can cause injuries. They can happen to people of all ages. There are many things you can do to make your home safe and to help prevent falls. What can I do on the outside of my home?  Regularly fix the edges of walkways and driveways and fix any cracks.  Remove anything that might make you trip as you walk through a door, such as a raised step or threshold.  Trim any bushes or trees on the path to your home.  Use bright outdoor lighting.  Clear any walking paths of anything that might make someone trip, such as rocks or tools.  Regularly check to see if handrails are loose or broken. Make sure that both sides of any steps have handrails.  Any raised decks and porches should have guardrails on the edges.  Have any leaves, snow, or ice cleared regularly.  Use sand or salt on walking paths during winter.  Clean up any spills in your garage right away. This includes oil or grease spills. What can I do in the bathroom?  Use night lights.  Install grab bars by the toilet and in the tub and shower. Do not use towel bars as grab bars.  Use non-skid mats or decals in the tub or shower.  If you need to sit down in the shower, use a plastic, non-slip stool.  Keep the floor dry. Clean up any water that spills on the floor as soon as it happens.  Remove soap buildup in the tub or shower regularly.  Attach bath mats securely with double-sided non-slip rug tape.  Do not have throw rugs and other things on the floor that can make you trip. What can I do in the  bedroom?  Use night lights.  Make sure that you have a light by your bed that is easy to reach.  Do not use any sheets or blankets that are too big for your bed. They should not hang down onto the floor.  Have a firm chair that has side arms. You can use this for support while you get dressed.  Do not have throw rugs and other things on the floor that can make you trip. What can I do in the kitchen?  Clean up any spills right away.  Avoid walking on wet floors.  Keep items that you use a lot in easy-to-reach places.  If you need to reach something above you, use a strong step stool that has a grab bar.  Keep electrical cords out of the way.  Do not use floor polish or wax that makes floors slippery. If you must use wax, use non-skid floor wax.  Do not have throw rugs and other things on the floor that can make you trip. What can I do with my stairs?  Do not leave any items on the stairs.  Make sure that there are  handrails on both sides of the stairs and use them. Fix handrails that are broken or loose. Make sure that handrails are as long as the stairways.  Check any carpeting to make sure that it is firmly attached to the stairs. Fix any carpet that is loose or worn.  Avoid having throw rugs at the top or bottom of the stairs. If you do have throw rugs, attach them to the floor with carpet tape.  Make sure that you have a light switch at the top of the stairs and the bottom of the stairs. If you do not have them, ask someone to add them for you. What else can I do to help prevent falls?  Wear shoes that:  Do not have high heels.  Have rubber bottoms.  Are comfortable and fit you well.  Are closed at the toe. Do not wear sandals.  If you use a stepladder:  Make sure that it is fully opened. Do not climb a closed stepladder.  Make sure that both sides of the stepladder are locked into place.  Ask someone to hold it for you, if possible.  Clearly mark and make  sure that you can see:  Any grab bars or handrails.  First and last steps.  Where the edge of each step is.  Use tools that help you move around (mobility aids) if they are needed. These include:  Canes.  Walkers.  Scooters.  Crutches.  Turn on the lights when you go into a dark area. Replace any light bulbs as soon as they burn out.  Set up your furniture so you have a clear path. Avoid moving your furniture around.  If any of your floors are uneven, fix them.  If there are any pets around you, be aware of where they are.  Review your medicines with your doctor. Some medicines can make you feel dizzy. This can increase your chance of falling. Ask your doctor what other things that you can do to help prevent falls. This information is not intended to replace advice given to you by your health care provider. Make sure you discuss any questions you have with your health care provider. Document Released: 06/28/2009 Document Revised: 02/07/2016 Document Reviewed: 10/06/2014 Elsevier Interactive Patient Education  2017 [employer].

## 2020-07-10 NOTE — Telephone Encounter (Signed)
Mr. arzell, mcgeehan are scheduled for a virtual visit with your provider today.    Just as we do with appointments in the office, we must obtain your consent to participate.  Your consent will be active for this visit and any virtual visit you may have with one of our providers in the next 365 days.    If you have a MyChart account, I can also send a copy of this consent to you electronically.  All virtual visits are billed to your insurance company just like a traditional visit in the office.  As this is a virtual visit, video technology does not allow for your provider to perform a traditional examination.  This may limit your provider's ability to fully assess your condition.  If your provider identifies any concerns that need to be evaluated in person or the need to arrange testing such as labs, EKG, etc, we will make arrangements to do so.    Although advances in technology are sophisticated, we cannot ensure that it will always work on either your end or our end.  If the connection with a video visit is poor, we may have to switch to a telephone visit.  With either a video or telephone visit, we are not always able to ensure that we have a secure connection.   I need to obtain your verbal consent now.   Are you willing to proceed with your visit today?   Khyson Pletz has provided verbal consent on 07/10/2020 for a virtual visit (video or telephone).   Carroll Kinds, CMA 07/10/2020  10:05 AM

## 2020-07-11 DIAGNOSIS — H401122 Primary open-angle glaucoma, left eye, moderate stage: Secondary | ICD-10-CM | POA: Diagnosis not present

## 2020-07-18 DIAGNOSIS — H401112 Primary open-angle glaucoma, right eye, moderate stage: Secondary | ICD-10-CM | POA: Diagnosis not present

## 2020-07-25 ENCOUNTER — Encounter: Payer: Self-pay | Admitting: Internal Medicine

## 2020-07-25 ENCOUNTER — Other Ambulatory Visit: Payer: Self-pay

## 2020-07-25 ENCOUNTER — Non-Acute Institutional Stay: Payer: Medicare Other | Admitting: Internal Medicine

## 2020-07-25 VITALS — BP 126/72 | HR 76 | Temp 97.5°F | Ht 67.0 in | Wt 172.0 lb

## 2020-07-25 DIAGNOSIS — K317 Polyp of stomach and duodenum: Secondary | ICD-10-CM

## 2020-07-25 DIAGNOSIS — R739 Hyperglycemia, unspecified: Secondary | ICD-10-CM | POA: Diagnosis not present

## 2020-07-25 DIAGNOSIS — K22710 Barrett's esophagus with low grade dysplasia: Secondary | ICD-10-CM

## 2020-07-25 DIAGNOSIS — E782 Mixed hyperlipidemia: Secondary | ICD-10-CM

## 2020-07-25 DIAGNOSIS — R350 Frequency of micturition: Secondary | ICD-10-CM | POA: Diagnosis not present

## 2020-07-25 DIAGNOSIS — D500 Iron deficiency anemia secondary to blood loss (chronic): Secondary | ICD-10-CM

## 2020-07-25 DIAGNOSIS — K2100 Gastro-esophageal reflux disease with esophagitis, without bleeding: Secondary | ICD-10-CM | POA: Diagnosis not present

## 2020-07-25 DIAGNOSIS — M7022 Olecranon bursitis, left elbow: Secondary | ICD-10-CM

## 2020-07-25 DIAGNOSIS — R142 Eructation: Secondary | ICD-10-CM | POA: Diagnosis not present

## 2020-07-25 DIAGNOSIS — Z7189 Other specified counseling: Secondary | ICD-10-CM | POA: Diagnosis not present

## 2020-07-25 NOTE — Progress Notes (Signed)
Location:  [facility] of Service:  Clinic (12)  Provider: Lelah Rennaker L. Mariea Clonts, D.O., C.M.D.  Code Status: FULL CODE Goals of Care:  Advanced Directives 07/25/2020  Does Patient Have a Medical Advance Directive? Yes  Type of Advance Directive [place]  Does patient want to make changes to medical advance directive? No - Patient declined  Copy of [place] in Chart? -  Pre-existing out of facility DNR order (yellow form or pink MOST form) -     Chief Complaint  Patient presents with  . Medical Management of Chronic Issues    6 month follow up    HPI: Patient is a 79 y.o. male seen today for medical management of chronic diseases.    Bothered by seasonal allergies this month instead of in September.  They're visiting their great grandson for thanksgiving outside of [place].  He has covid at present.  Now he has to wait for his vaccine.   Boosters will be next week.  Everyone else is vaccinated.    Left elbow has a rough area vs right right, but not pain and no longer has fluid accumulation.  Belching unchanged.  Had his two endoscopies.  First with Dr. Havery Moros and second with Dr. Early Osmond.  He was to have a virtual visit and never did happen.  He does not want to go through a procedure to treat potential barrett's esophagus when biopsy was indeterminate.    He's begun to think it's food related b/c some nights there is no belching and tons other nights.  Not painful.  Feels scotch helps.    He just saw Dr. Trecia Rogers eyes water in the cold.  His ducts are small and she recommended enlarging them, but he'd rather blot his eyes with a tissue.  He did have a procedure on his corneas--selective laser therapy done last week to relieve the pressure.  Then he had prednisone drops afterward.    He is urinating more frequently.  Notes he's inside more.  His younger brother had a complete blockage from prostate and  had to be catheterized and now takes pills.  He says he goes now himself 8-10 times per day not 4-5 times per day.  No issues starting his stream.  No difficulty with stream.  Gets up 2 times at night instead of once.  Able to get back to sleep.    Simuel is worried about Pat's hearing, some cognitive changes and she's concerned about her swelling.  Her PD is progressing.  He's more concerned about her mental symptoms. She forgets a pill occasionally.    Past Medical History:  Diagnosis Date  . Allergy    seasonal  . Arthritis    knees  . Cataract    BILATERAL REMOVED  . Gastric polyp   . GERD (gastroesophageal reflux disease)   . Glaucoma   . Hemorrhoids   . Hyperlipidemia   . Pneumonia    as teenager  . RBBB   . RBBB   . Vasovagal syncope    last episode was 2015-16; none since  . Wears glasses     Past Surgical History:  Procedure Laterality Date  . BIOPSY  01/16/2020   Procedure: BIOPSY;  Surgeon: Rush Landmark Telford Nab., MD;  Location: [facility];  Service: Gastroenterology;;  . [place]  . CATARACT EXTRACTION W/ INTRAOCULAR LENS  IMPLANT, BILATERAL    . COLONOSCOPY  2002; 2007  . ENDOSCOPIC MUCOSAL  RESECTION N/A 01/16/2020   Procedure: ENDOSCOPIC MUCOSAL RESECTION;  Surgeon: Rush Landmark Telford Nab., MD;  Location: [facility];  Service: Gastroenterology;  Laterality: N/A;  . ESOPHAGOGASTRODUODENOSCOPY (EGD) WITH PROPOFOL N/A 01/16/2020   Procedure: ESOPHAGOGASTRODUODENOSCOPY (EGD) WITH PROPOFOL;  Surgeon: Rush Landmark Telford Nab., MD;  Location: [facility];  Service: Gastroenterology;  Laterality: N/A;  . HEMOSTASIS CLIP PLACEMENT  01/16/2020   Procedure: HEMOSTASIS CLIP PLACEMENT;  Surgeon: Irving Copas., MD;  Location: [facility];  Service: Gastroenterology;;  . HEMOSTASIS CONTROL  01/16/2020   Procedure: HEMOSTASIS CONTROL;  Surgeon: Irving Copas., MD;  Location: [facility];  Service: Gastroenterology;;  . POLYPECTOMY    .  SUBMUCOSAL LIFTING INJECTION  01/16/2020   Procedure: SUBMUCOSAL LIFTING INJECTION;  Surgeon: Rush Landmark Telford Nab., MD;  Location: [place] [facility];  Service: Gastroenterology;;  . Arnetha Courser TOOTH EXTRACTION      Allergies  Allergen Reactions  . Black Pepper [[place]] Other (See Comments)    bloating  . Ciprofloxacin Other (See Comments)    Bleeding-blood in stool   . Nsaids     History of bleeding    Outpatient Encounter Medications as of 07/25/2020  Medication Sig  . b complex vitamins tablet Take 1 tablet by mouth daily.  . bimatoprost (LUMIGAN) 0.01 % SOLN Place 1 drop into both eyes at bedtime.   . cetirizine (ZYRTEC) 10 MG tablet Take 10 mg by mouth daily as needed for allergies.   . Cholecalciferol (VITAMIN D) 2000 UNITS CAPS Take 1 capsule (2,000 Units total) by mouth daily.  . folic acid (FOLVITE) 947 MCG tablet Take 800 mcg by mouth daily.   . Glucosamine-Chondroit-Vit C-Mn (GLUCOSAMINE CHONDR 1500 COMPLX PO) Take 2 tablets by mouth daily.   Marland Kitchen omeprazole (PRILOSEC) 40 MG capsule Take 1 capsule (40 mg total) by mouth 2 (two) times daily before a meal.  . rosuvastatin (CRESTOR) 5 MG tablet TAKE ONE TABLET BY MOUTH DAILY  . timolol (TIMOPTIC) 0.5 % ophthalmic solution Place 1 drop into both eyes daily.   Marland Kitchen triamcinolone (NASACORT ALLERGY 24HR) 55 MCG/ACT AERO nasal inhaler Place 2 sprays into the nose daily as needed (congestion).  . vitamin B-12 (CYANOCOBALAMIN) 1000 MCG tablet Take 1,000 mcg by mouth daily.  . [DISCONTINUED] hydrocortisone cream 1 % Apply 1 application topically daily as needed for itching.   No facility-administered encounter medications on file as of 07/25/2020.    Review of Systems:  Review of Systems  Constitutional: Negative for chills, fever and malaise/fatigue.  HENT: Negative for congestion and sore throat.   Eyes: Positive for redness. Negative for blurred vision and pain.       Watery eyes  Respiratory: Negative for cough and shortness of breath.    Cardiovascular: Negative for chest pain, palpitations and leg swelling.  Gastrointestinal: Negative for abdominal pain, blood in stool, constipation, diarrhea and melena.       Belching  Genitourinary: Positive for frequency. Negative for dysuria, flank pain, hematuria and urgency.  Musculoskeletal: Negative for falls and joint pain.       Left elbow bursitis swelling resolved  Skin: Negative for itching and rash.  Neurological: Negative for dizziness and loss of consciousness.  Endo/Heme/Allergies: Does not bruise/bleed easily.  Psychiatric/Behavioral: Negative for depression and memory loss. The patient is not nervous/anxious and does not have insomnia.     Health Maintenance  Topic Date Due  . TETANUS/TDAP  12/09/2023  . INFLUENZA VACCINE  Completed  . COVID-19 Vaccine  Completed  . PNA vac Low Risk Adult  Completed    Physical Exam: Vitals:   07/25/20 0926  BP: 126/72  Pulse: 76  Temp: (!) 97.5 F (36.4 C)  TempSrc: Temporal  SpO2: 95%  Weight: 172 lb (78 kg)  Height: 5\' 7"  (1.702 m)   Body mass index is 26.94 kg/m. Physical Exam Vitals reviewed.  Constitutional:      General: He is not in acute distress.    Appearance: Normal appearance. He is not ill-appearing or toxic-appearing.  HENT:     Head: Normocephalic and atraumatic.  Eyes:     Comments: glasses  Cardiovascular:     Rate and Rhythm: Normal rate and regular rhythm.     Pulses: Normal pulses.     Heart sounds: Normal heart sounds.  Pulmonary:     Effort: Pulmonary effort is normal.     Breath sounds: Normal breath sounds. No wheezing, rhonchi or rales.  Abdominal:     General: Bowel sounds are normal.     Palpations: Abdomen is soft.  Musculoskeletal:        General: Normal range of motion.     Right lower leg: No edema.     Left lower leg: No edema.  Skin:    General: Skin is warm and dry.  Neurological:     General: No focal deficit present.     Mental Status: He is alert and oriented to  person, place, and time.     Motor: No weakness.     Gait: Gait normal.  Psychiatric:        Mood and Affect: Mood normal.        Behavior: Behavior normal.        Thought Content: Thought content normal.        Judgment: Judgment normal.     Labs reviewed: Basic Metabolic Panel: Recent Labs    04/12/20 0000  NA 138  K 4.1  CL 99  CO2 26*  BUN 10  CREATININE 1.1  CALCIUM 9.2   Liver Function Tests: No results for input(s): AST, ALT, ALKPHOS, BILITOT, PROT, ALBUMIN in the last 8760 hours. No results for input(s): LIPASE, AMYLASE in the last 8760 hours. No results for input(s): AMMONIA in the last 8760 hours. CBC: Recent Labs    11/23/19 1055 11/23/19 1055 01/16/20 0900 02/09/20 0000 04/12/20 0000  WBC 6.9   < > 6.6 7.1 6.7  HGB 12.3*   < > 12.1* 13.5 15.5  HCT 37.3*   < > 38.7* 39* 46  MCV 83.4  --  86.4  --   --   PLT 262.0   < > 260 238 223   < > = values in this interval not displayed.   Lipid Panel: No results for input(s): CHOL, HDL, LDLCALC, TRIG, CHOLHDL, LDLDIRECT in the last 8760 hours. Lab Results  Component Value Date   HGBA1C 6.0 04/12/2020     Assessment/Plan 1. Olecranon bursitis, left elbow -resolved after drainage and then some nsaids short term with food, had hit elbow that led to this though it was far removed from event, no pain  2. Belching symptom -ongoing, he's now noting correlation with foods and says he should keep a diary which I agreed was a wise plan  3. Barrett's esophagus with low grade dysplasia -one biopsy was uncertain--he's opted not to pursue treatment for this at this time   4. Gastroesophageal reflux disease with esophagitis without hemorrhage -stable, cont same omeprazole  5. Gastric polyps -noted on EGD, pathology benign, monitor  6. Iron deficiency anemia due to chronic blood loss -hgb had improved, monitor  7. Urinary frequency -newly worse, monitor for additional symptoms or if this becomes bothersome to  him -he does not want testing at this time  -would consider flomax if this becomes more considerable for him  8. Mixed hyperlipidemia -cont crestor, f/u labs  9. Hyperglycemia -f/u labs, affected by scotch, but he loves that and does not want to stop it at 79 yo  Counseled about covid-19 risks over the holidays and practicing distancing, masking and washing hands   Labs/tests ordered:  Cbc with diff, bmp, hepatic function, flp, hba1c  Next appt:  6 mos with fasting labs before at [facility]. Calena Salem, D.O. [facility] [address], [place] [address] Cell Phone (Mon-Fri 8am-5pm):  [phone] On Call:  [phone] & follow prompts after 5pm & weekends Office Phone:  [phone] Office Fax:  [phone]

## 2020-08-02 ENCOUNTER — Telehealth: Payer: Self-pay

## 2020-08-02 NOTE — Telephone Encounter (Signed)
-----   Message from Irving Copas., MD sent at 08/01/2020 11:15 PM EST ----- Regarding: Follow up This patient needs a Telemedicine or clinic visit with me to discuss his current clinical status, check his blood count and iron stores and talk about Barrett's ablation and also whether any further polyp resections should be considered. Please set up as able and let Dr. Havery Moros and I know when it is scheduled for. Thanks. GM

## 2020-08-02 NOTE — Telephone Encounter (Signed)
Left message on machine to call back  

## 2020-08-02 NOTE — Telephone Encounter (Signed)
I spoke with the pt and appt has been made for 09/19/20 with Dr Rush Landmark.

## 2020-08-06 ENCOUNTER — Other Ambulatory Visit: Payer: Self-pay | Admitting: Internal Medicine

## 2020-08-06 DIAGNOSIS — E782 Mixed hyperlipidemia: Secondary | ICD-10-CM

## 2020-08-06 NOTE — Telephone Encounter (Signed)
rx sent to pharmacy by e-script  

## 2020-09-19 ENCOUNTER — Encounter: Payer: Self-pay | Admitting: Gastroenterology

## 2020-09-19 ENCOUNTER — Other Ambulatory Visit (INDEPENDENT_AMBULATORY_CARE_PROVIDER_SITE_OTHER): Payer: Medicare Other

## 2020-09-19 ENCOUNTER — Ambulatory Visit (INDEPENDENT_AMBULATORY_CARE_PROVIDER_SITE_OTHER): Payer: Medicare Other | Admitting: Gastroenterology

## 2020-09-19 VITALS — BP 146/68 | HR 75 | Ht 67.0 in | Wt 175.0 lb

## 2020-09-19 DIAGNOSIS — R142 Eructation: Secondary | ICD-10-CM

## 2020-09-19 DIAGNOSIS — E611 Iron deficiency: Secondary | ICD-10-CM | POA: Diagnosis not present

## 2020-09-19 DIAGNOSIS — Z862 Personal history of diseases of the blood and blood-forming organs and certain disorders involving the immune mechanism: Secondary | ICD-10-CM

## 2020-09-19 DIAGNOSIS — K317 Polyp of stomach and duodenum: Secondary | ICD-10-CM | POA: Diagnosis not present

## 2020-09-19 DIAGNOSIS — K2270 Barrett's esophagus without dysplasia: Secondary | ICD-10-CM

## 2020-09-19 DIAGNOSIS — K22719 Barrett's esophagus with dysplasia, unspecified: Secondary | ICD-10-CM

## 2020-09-19 LAB — CBC
HCT: 44.9 % (ref 39.0–52.0)
Hemoglobin: 15.5 g/dL (ref 13.0–17.0)
MCHC: 34.4 g/dL (ref 30.0–36.0)
MCV: 92.6 fl (ref 78.0–100.0)
Platelets: 261 10*3/uL (ref 150.0–400.0)
RBC: 4.84 Mil/uL (ref 4.22–5.81)
RDW: 13.2 % (ref 11.5–15.5)
WBC: 9.1 10*3/uL (ref 4.0–10.5)

## 2020-09-19 LAB — IBC + FERRITIN
Ferritin: 85.4 ng/mL (ref 22.0–322.0)
Iron: 76 ug/dL (ref 42–165)
Saturation Ratios: 19 % — ABNORMAL LOW (ref 20.0–50.0)
Transferrin: 286 mg/dL (ref 212.0–360.0)

## 2020-09-19 NOTE — Patient Instructions (Signed)
Your provider has requested that you go to the basement level for lab work before leaving today. Press "B" on the elevator. The lab is located at the first door on the left as you exit the elevator.  Due to recent changes in healthcare laws, you may see the results of your imaging and laboratory studies on MyChart before your provider has had a chance to review them.  We understand that in some cases there may be results that are confusing or concerning to you. Not all laboratory results come back in the same time frame and the provider may be waiting for multiple results in order to interpret others.  Please give [place] 48 hours in order for your provider to thoroughly review all the results before contacting the office for clarification of your results.   You will need a 6 month follow-up . Office will contact you to schedule at a later time.   Thank you for choosing me and [facility].  Dr. Meridee Score

## 2020-09-22 ENCOUNTER — Encounter: Payer: Self-pay | Admitting: Gastroenterology

## 2020-09-22 DIAGNOSIS — E611 Iron deficiency: Secondary | ICD-10-CM | POA: Insufficient documentation

## 2020-09-22 DIAGNOSIS — K22719 Barrett's esophagus with dysplasia, unspecified: Secondary | ICD-10-CM | POA: Insufficient documentation

## 2020-09-22 DIAGNOSIS — R142 Eructation: Secondary | ICD-10-CM | POA: Insufficient documentation

## 2020-09-22 DIAGNOSIS — Z862 Personal history of diseases of the blood and blood-forming organs and certain disorders involving the immune mechanism: Secondary | ICD-10-CM | POA: Insufficient documentation

## 2020-09-22 NOTE — Progress Notes (Signed)
[facility] VISIT   Primary Care Provider Gayland Curry, DO [place] [id number] [phone]  Patient Profile: Ian Rowe is a 80 y.o. male with a pmh significant for allergies, osteoarthritis, hyperlipidemia, gout, GERD, Barrett's esophagus (focally indefinite for dysplasia plus nondysplastic on recent biopsies), hyperplastic gastric polyps, colon polyps, hemorrhoids.  The patient presents to the [facility] for an evaluation and management of problem(s) noted below:  Problem List 1. Barrett's esophagus with dysplasia   2. Barrett's esophagus without dysplasia   3. Gastric polyps   4. History of anemia   5. Iron deficiency   6. Belching     History of Present Illness Please see prior consultation and progress notes by Dr. Havery Moros and PA [facility] and myself for full details of HPI.  Interval History The patient returns for a follow-up.  Patient's last interaction with the [facility] was when I performed an endoscopy with multiple gastric polyp resections and biopsies of his known Barrett's esophagus.  The findings of his gastric polyps were all hyperplastic.  We removed the majority of these the left one large 1 due to the number of resections performed on that day.  We have not seen whether the patient has had an adequate persistent response to the removal of these although we know that 5 months ago his blood counts had improved dramatically and were now normal.  We do not have evidence of his iron studies however in our system.  The patient's esophagus biopsies returned showing evidence of indeterminant dysplasia (with cellular findings concerning for focal high-grade dysplasia but unable to give a definitive diagnosis) in the uppermost portion of the biopsies of the Barrett's and the rest of the segment of Barrett's was not dysplastic.  We had offered the patient a follow-up to discuss potential role of further  biopsies versus therapies in regards to concern for potential high-grade dysplasia.  Today, the patient states he is not interested in endoscopic ablative techniques because of the potential risks that he is understood at a basic level.  He also states that since that it was only in 1 particular area he is not sure that this requires additional management.  He is not having any other significant GI symptoms currently.  He feels his energy levels are good but again we do not have recent blood work from the last few months to show whether he has anemia or not.  The patient's main symptoms which led him to see Dr. Havery Moros in the first place were issues of belching and those issues persist.  He wonders if we will ever find out the reason for his belching.  He provides an audio of a 5-minute period of belching for [place] to review today.  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  GI Review of Systems Positive as above including periods of bloating/distention Negative for odynophagia, dysphagia, nausea, vomiting, pain, change in bowel habits   Review of Systems General: Denies fevers/chills/weight loss unintentionally Cardiovascular: Denies chest pain Pulmonary: Denies shortness of breath Gastroenterological: See HPI Genitourinary: Denies darkened urine Hematological: Denies easy bruising/bleeding Dermatological: Denies jaundice Psychological: Mood is stable   Medications Current Outpatient Medications  Medication Sig Dispense Refill  . b complex vitamins tablet Take 1 tablet by mouth daily.    . bimatoprost (LUMIGAN) 0.01 % SOLN Place 1 drop into  both eyes at bedtime.    . cetirizine (ZYRTEC) 10 MG tablet Take 10 mg by mouth daily as needed for allergies.     . Cholecalciferol (VITAMIN D) 2000 UNITS CAPS Take 1 capsule (2,000 Units total) by mouth daily. 30 capsule 3   . folic acid (FOLVITE) 175 MCG tablet Take 800 mcg by mouth daily.     . Glucosamine-Chondroit-Vit C-Mn (GLUCOSAMINE CHONDR 1500 COMPLX PO) Take 2 tablets by mouth daily.     Marland Kitchen omeprazole (PRILOSEC) 40 MG capsule Take 1 capsule (40 mg total) by mouth 2 (two) times daily before a meal. 180 capsule 3  . rosuvastatin (CRESTOR) 5 MG tablet TAKE ONE TABLET BY MOUTH DAILY 90 tablet 2  . timolol (TIMOPTIC) 0.5 % ophthalmic solution Place 1 drop into both eyes daily.     Marland Kitchen triamcinolone (NASACORT) 55 MCG/ACT AERO nasal inhaler Place 2 sprays into the nose daily as needed (congestion).    . vitamin B-12 (CYANOCOBALAMIN) 1000 MCG tablet Take 1,000 mcg by mouth daily.     No current facility-administered medications for this visit.    Allergies Allergies  Allergen Reactions  . Black Pepper [[place]] Other (See Comments)    bloating  . Ciprofloxacin Other (See Comments)    Bleeding-blood in stool   . Nsaids     History of bleeding    Histories Past Medical History:  Diagnosis Date  . Allergy    seasonal  . Arthritis    knees  . Cataract    BILATERAL REMOVED  . Gastric polyp   . GERD (gastroesophageal reflux disease)   . Glaucoma   . Hemorrhoids   . Hyperlipidemia   . Pneumonia    as teenager  . RBBB   . RBBB   . Vasovagal syncope    last episode was 2015-16; none since  . Wears glasses    Past Surgical History:  Procedure Laterality Date  . BIOPSY  01/16/2020   Procedure: BIOPSY;  Surgeon: Rush Landmark Telford Nab., MD;  Location: [place];  Service: Gastroenterology;;  . [place]  . CATARACT EXTRACTION W/ INTRAOCULAR LENS  IMPLANT, BILATERAL  2021  . COLONOSCOPY  2002; 2007  . ENDOSCOPIC MUCOSAL RESECTION N/A 01/16/2020   Procedure: ENDOSCOPIC MUCOSAL RESECTION;  Surgeon: Rush Landmark Telford Nab., MD;  Location: [place];  Service: Gastroenterology;  Laterality: N/A;  . ESOPHAGOGASTRODUODENOSCOPY (EGD) WITH PROPOFOL N/A 01/16/2020   Procedure:  ESOPHAGOGASTRODUODENOSCOPY (EGD) WITH PROPOFOL;  Surgeon: Rush Landmark Telford Nab., MD;  Location: [place];  Service: Gastroenterology;  Laterality: N/A;  . HEMOSTASIS CLIP PLACEMENT  01/16/2020   Procedure: HEMOSTASIS CLIP PLACEMENT;  Surgeon: Irving Copas., MD;  Location: [place];  Service: Gastroenterology;;  . HEMOSTASIS CONTROL  01/16/2020   Procedure: HEMOSTASIS CONTROL;  Surgeon: Irving Copas., MD;  Location: [place];  Service: Gastroenterology;;  . POLYPECTOMY    . SUBMUCOSAL LIFTING INJECTION  01/16/2020   Procedure: SUBMUCOSAL LIFTING INJECTION;  Surgeon: Rush Landmark Telford Nab., MD;  Location: [place];  Service: Gastroenterology;;  . Arnetha Courser TOOTH EXTRACTION     Social History   Socioeconomic History  . Marital status: Married    Spouse name: Mardene Celeste  . Number of children: 2  . Years of education: Not on file  . Highest education level: Not on file  Occupational History  . Occupation: Retired Clinical biochemist  Tobacco Use  . Smoking status: Never Smoker  . Smokeless tobacco: Never Used  Vaping Use  . Vaping Use: Never used  Substance  and Sexual Activity  . Alcohol use: Yes    Alcohol/week: 10.0 standard drinks    Types: 7 Shots of liquor, 3 Glasses of wine per week    Comment: 2 scotch at bedtime nightly, occ glass of wine   . Drug use: No  . Sexual activity: Not on file  Other Topics Concern  . Not on file  Social History Narrative   Moved to [place] 2014.   Married   Never smoked   Alcohol - scotch or wine nightly   Walks 150 minutes per week.    POA   Social Determinants of Health   Financial Resource Strain: Not on file  Food Insecurity: Not on file  Transportation Needs: Not on file  Physical Activity: Not on file  Stress: Not on file  Social Connections: Not on file  Intimate Partner Violence: Not on file   Family History  Problem Relation Age of Onset  . Alzheimer's disease Mother   .  Heart disease Father   . Colon cancer Neg Hx   . Colon polyps Neg Hx   . Esophageal cancer Neg Hx   . Stomach cancer Neg Hx   . Rectal cancer Neg Hx   . Inflammatory bowel disease Neg Hx   . Liver disease Neg Hx   . Pancreatic cancer Neg Hx    I have reviewed his medical, social, and family history in detail and updated the electronic medical record as necessary.    PHYSICAL EXAMINATION  BP (!) 146/68   Pulse 75   Ht _0  (1.702 m)   Wt 175 lb (79.4 kg)   SpO2 99%   BMI 27.41 kg/m  GEN: NAD, appears stated age, doesn't appear chronically ill PSYCH: Cooperative, without pressured speech EYE: Conjunctivae pink, sclerae anicteric ENT: Masked CV: Nontachycardic RESP: No audible wheezing GI: NABS, soft, NT/ND, without rebound or guarding MSK/EXT: No lower extremity edema SKIN: No jaundice NEURO:  Alert & Oriented x 3, no focal deficits   REVIEW OF DATA  I reviewed the following data at the time of this encounter:  GI Procedures and Studies  May 2021 EGD - No gross lesions in esophagus proximally. - Esophageal mucosal changes consistent with short-segment Barrett's esophagus. Biopsied. - Medium-sized hiatal hernia. - Three pedunculated gastric polyps. Resected and retrieved. Clips (MR conditional) were placed. - Four semi-sessile gastric polyps. Resected and retrieved. Clips (MR conditional) were placed. - A single large pedunculated gastric polyp. Resection not attempted - query Polyloop in future. - Multiple gastric polyps noted but not removed. - No gross lesions in the duodenal bulb, in the first portion of the duodenum and in the second portion of the duodenum. Pathology FINAL MICROSCOPIC DIAGNOSIS:  A. STOMACH, POLYPECTOMY:  - Gastric hyperplastic polyp(s) with ulceration  - Negative for intestinal metaplasia, dysplasia or malignancy  B. ESOPHAGUS, AT 34CM, BIOPSY:  - Barretts esophagus, negative for dysplasia  C. ESOPHAGUS, AT 33CM, BIOPSY:  - Barretts  esophagus, negative for dysplasia  D. ESOPHAGUS, AT 32CM, BIOPSY:  - Barrett's esophagus, focally indefinite for dysplasia. See comment  COMMENT:  D. The Barrett's mucosa shows a few isolated glands with enlarged and hyperchromatic nuclei, worrisome for high-grade dysplasia.  Immunohistochemical stain for p53 shows a single gland with clonal-like  overexpression pattern. But this focus is very small and maturation  towards the surface cannot be evaluated due to stripping of the surface  mucosa. Hence, the changes are interpreted as indefinite for dysplasia.  Laboratory Studies  Reviewed that  in epic  Imaging Studies  No relevant imaging to review   ASSESSMENT  Ian Rowe is a 80 y.o. male with a pmh significant for allergies, osteoarthritis, hyperlipidemia, gout, GERD, Barrett's esophagus (focally indefinite for dysplasia plus nondysplastic on recent biopsies), hyperplastic gastric polyps, colon polyps, hemorrhoids.  The patient is seen today for evaluation and management of:  1. Barrett's esophagus with dysplasia   2. Barrett's esophagus without dysplasia   3. Gastric polyps   4. History of anemia   5. Iron deficiency   6. Belching    The patient is clinically and hemodynamically stable from a Barrett's esophagus perspective.  He does have evidence on recent biopsies of indeterminant dysplasia (pathology concern for potential focal high-grade dysplasia but none definitive on final diagnosis) as well as nondysplastic Barrett's and the majority of his sampling.  I had an extensive discussion with him about the risks of high-grade dysplasia and progression to esophageal cancer.  Studies suggest that the risk of patients who have high-grade dysplasia progressing to esophageal adenocarcinoma is anywhere between 3 to 6 %/year.  I would not perform ablative techniques even if we were to consider that unless we had another biopsy that truly showed evidence of high-grade dysplasia.  With  that being said I would recommend repeat endoscopic evaluation at a minimum.  The patient wants to wait for 2 to 3 years but I would recommend strongly a 1 year follow-up so at least looking in May of this year.  We discussed the role of ablative techniques for patients who have high-grade dysplasia including radiofrequency ablation.  We discussed the risks of the procedure which are mostly based on the endoscopy as well as the potential for stricturing as well as chest pain and abscess formation that can occur in some individuals.  The patient at this time is deferring until he has an opportunity to discuss this further with his primary care provider.  I think this patient has a life expectancy that would be greater than 10 years and so his cumulative risk over 10 years should we do nothing (if this was high-grade dysplasia) could be upwards of 30 to 60%, however this is the patient's decision to make at the end of the day.  In regards to his previously removed hyperplastic polyps, I would like to see that his hemoglobin remains stable as 5 months ago he had a significant improvement in his hemoglobin.  We will recheck his blood counts and iron indices today.  If he has evidence of persistent iron deficiency we certainly can consider removal of that last large polyp otherwise we can monitor and keep him where he is in regards to his iron supplementation.  I will update his primary gastroenterologist (Dr. Havery Moros) and his primary care provider with the results.  His biggest issue remains his belching.  I offered him consideration of SIBO breath testing and EPI evaluation though the patient defers on this currently.  I will defer any further work-up of this to his primary GI team.  I will tentatively put in a recall for 4 months for EGD (and if we are only going to plan to look at his Barrett's Dr. Havery Moros I can do that in the [place] otherwise if there is plan for gastric polyp resection then we will need to do  that in the hospital-based setting and I will do that).  All patient questions were answered to the best of my ability, and the patient agrees to the aforementioned plan of  action with follow-up as indicated.   PLAN  Obtain laboratories to evaluate anemia and iron indices as noted below Strongly recommend endoscopy for surveillance by the summer of this year (1 year since last) though patient is not sure if he wants to have this done at that time Continue PPI therapy currently Follow-up if he has not decided upon this sooner Further follow-up/work-up of belching as per Dr. Havery Moros   Orders Placed This Encounter  Procedures  . CBC  . IBC + Ferritin    New Prescriptions   No medications on file   Modified Medications   No medications on file    Planned Follow Up Return in about 6 months (around 03/19/2021).   Total Time in Face-to-Face and in Coordination of Care for patient including independent/personal interpretation/review of prior testing, medical history, examination, medication adjustment, communicating results with the patient directly, and documentation with the EHR is 25 minutes.   Justice Britain, MD [facility] Office # [phone]

## 2020-11-02 ENCOUNTER — Encounter (HOSPITAL_COMMUNITY): Payer: Self-pay | Admitting: Emergency Medicine

## 2020-11-02 ENCOUNTER — Emergency Department (HOSPITAL_COMMUNITY): Payer: No Typology Code available for payment source

## 2020-11-02 ENCOUNTER — Emergency Department (HOSPITAL_COMMUNITY)
Admission: EM | Admit: 2020-11-02 | Discharge: 2020-11-02 | Disposition: A | Discharge status: Home / Self Care | Payer: No Typology Code available for payment source | Attending: Emergency Medicine | Admitting: Emergency Medicine

## 2020-11-02 DIAGNOSIS — S0990XA Unspecified injury of head, initial encounter: Secondary | ICD-10-CM | POA: Insufficient documentation

## 2020-11-02 DIAGNOSIS — K317 Polyp of stomach and duodenum: Secondary | ICD-10-CM | POA: Diagnosis not present

## 2020-11-02 DIAGNOSIS — S20211A Contusion of right front wall of thorax, initial encounter: Secondary | ICD-10-CM | POA: Insufficient documentation

## 2020-11-02 DIAGNOSIS — R0781 Pleurodynia: Secondary | ICD-10-CM | POA: Diagnosis not present

## 2020-11-02 DIAGNOSIS — S0993XA Unspecified injury of face, initial encounter: Secondary | ICD-10-CM | POA: Diagnosis not present

## 2020-11-02 DIAGNOSIS — S299XXA Unspecified injury of thorax, initial encounter: Secondary | ICD-10-CM | POA: Diagnosis not present

## 2020-11-02 DIAGNOSIS — S0592XA Unspecified injury of left eye and orbit, initial encounter: Secondary | ICD-10-CM | POA: Diagnosis present

## 2020-11-02 DIAGNOSIS — S3991XA Unspecified injury of abdomen, initial encounter: Secondary | ICD-10-CM | POA: Diagnosis not present

## 2020-11-02 DIAGNOSIS — Z23 Encounter for immunization: Secondary | ICD-10-CM | POA: Insufficient documentation

## 2020-11-02 DIAGNOSIS — S0590XA Unspecified injury of unspecified eye and orbit, initial encounter: Secondary | ICD-10-CM | POA: Diagnosis not present

## 2020-11-02 DIAGNOSIS — Z79899 Other long term (current) drug therapy: Secondary | ICD-10-CM | POA: Diagnosis not present

## 2020-11-02 DIAGNOSIS — Y92481 Parking lot as the place of occurrence of the external cause: Secondary | ICD-10-CM | POA: Insufficient documentation

## 2020-11-02 DIAGNOSIS — Z20822 Contact with and (suspected) exposure to covid-19: Secondary | ICD-10-CM | POA: Diagnosis not present

## 2020-11-02 DIAGNOSIS — H1132 Conjunctival hemorrhage, left eye: Secondary | ICD-10-CM | POA: Insufficient documentation

## 2020-11-02 DIAGNOSIS — S0532XA Ocular laceration without prolapse or loss of intraocular tissue, left eye, initial encounter: Secondary | ICD-10-CM | POA: Diagnosis not present

## 2020-11-02 DIAGNOSIS — R609 Edema, unspecified: Secondary | ICD-10-CM | POA: Diagnosis not present

## 2020-11-02 DIAGNOSIS — I10 Essential (primary) hypertension: Secondary | ICD-10-CM | POA: Diagnosis not present

## 2020-11-02 DIAGNOSIS — Z041 Encounter for examination and observation following transport accident: Secondary | ICD-10-CM | POA: Diagnosis not present

## 2020-11-02 LAB — COMPREHENSIVE METABOLIC PANEL
ALT: 24 U/L (ref 0–44)
AST: 29 U/L (ref 15–41)
Albumin: 3.4 g/dL — ABNORMAL LOW (ref 3.5–5.0)
Alkaline Phosphatase: 76 U/L (ref 38–126)
Anion gap: 10 (ref 5–15)
BUN: 15 mg/dL (ref 8–23)
CO2: 25 mmol/L (ref 22–32)
Calcium: 8.7 mg/dL — ABNORMAL LOW (ref 8.9–10.3)
Chloride: 99 mmol/L (ref 98–111)
Creatinine, Ser: 1.16 mg/dL (ref 0.61–1.24)
GFR, Estimated: >60 mL/min (ref >60)
Glucose, Bld: 140 mg/dL — ABNORMAL HIGH (ref 70–99)
Potassium: 3.9 mmol/L (ref 3.5–5.1)
Sodium: 134 mmol/L — ABNORMAL LOW (ref 135–145)
Total Bilirubin: 0.9 mg/dL (ref 0.3–1.2)
Total Protein: 6.5 g/dL (ref 6.5–8.1)

## 2020-11-02 LAB — I-STAT CHEM 8, ED
BUN: 18 mg/dL (ref 8–23)
Calcium, Ion: 1.14 mmol/L — ABNORMAL LOW (ref 1.15–1.40)
Chloride: 100 mmol/L (ref 98–111)
Creatinine, Ser: 1 mg/dL (ref 0.61–1.24)
Glucose, Bld: 139 mg/dL — ABNORMAL HIGH (ref 70–99)
HCT: 41 % (ref 39.0–52.0)
Hemoglobin: 13.9 g/dL (ref 13.0–17.0)
Potassium: 4 mmol/L (ref 3.5–5.1)
Sodium: 138 mmol/L (ref 135–145)
TCO2: 26 mmol/L (ref 22–32)

## 2020-11-02 LAB — SAMPLE TO BLOOD BANK

## 2020-11-02 LAB — CBC
HCT: 43.1 % (ref 39.0–52.0)
Hemoglobin: 14.5 g/dL (ref 13.0–17.0)
MCH: 31.6 pg (ref 26.0–34.0)
MCHC: 33.6 g/dL (ref 30.0–36.0)
MCV: 93.9 fL (ref 80.0–100.0)
Platelets: 220 10*3/uL (ref 150–400)
RBC: 4.59 MIL/uL (ref 4.22–5.81)
RDW: 13.1 % (ref 11.5–15.5)
WBC: 8.2 10*3/uL (ref 4.0–10.5)
nRBC: 0 % (ref 0.0–0.2)

## 2020-11-02 LAB — RESP PANEL BY RT-PCR (FLU A&B, COVID) ARPGX2
Influenza A by PCR: NEGATIVE
Influenza B by PCR: NEGATIVE
SARS Coronavirus 2 by RT PCR: NEGATIVE

## 2020-11-02 LAB — LACTIC ACID, PLASMA: Lactic Acid, Venous: 1.7 mmol/L (ref 0.5–1.9)

## 2020-11-02 LAB — URINALYSIS, ROUTINE W REFLEX MICROSCOPIC
Bilirubin Urine: NEGATIVE
Glucose, UA: NEGATIVE mg/dL
Hgb urine dipstick: NEGATIVE
Ketones, ur: NEGATIVE mg/dL
Leukocytes,Ua: NEGATIVE
Nitrite: NEGATIVE
Protein, ur: NEGATIVE mg/dL
Specific Gravity, Urine: 1.009 (ref 1.005–1.030)
pH: 6 (ref 5.0–8.0)

## 2020-11-02 LAB — PROTIME-INR
INR: 1 (ref 0.8–1.2)
Prothrombin Time: 12.5 seconds (ref 11.4–15.2)

## 2020-11-02 MED ORDER — BACITRACIN-POLYMYXIN B 500-10000 UNIT/GM OP OINT: 1.0000 "application " | TOPICAL_OINTMENT | Freq: Three times a day (TID) | OPHTHALMIC | 0 refills | Status: AC

## 2020-11-02 MED ORDER — TETANUS-DIPHTH-ACELL PERTUSSIS 5-2.5-18.5 LF-MCG/0.5 IM SUSY
0.5000 mL | PREFILLED_SYRINGE | Freq: Once | INTRAMUSCULAR | Status: AC
  Administered 2020-11-02: 0.5 mL via INTRAMUSCULAR
  Filled 2020-11-02: qty 0.5

## 2020-11-02 MED ORDER — IOHEXOL 300 MG/ML  SOLN
100.0000 mL | Freq: Once | INTRAMUSCULAR | Status: AC | PRN
  Administered 2020-11-02: 100 mL via INTRAVENOUS

## 2020-11-02 NOTE — ED Notes (Signed)
Patient transported to CT 

## 2020-11-02 NOTE — Discharge Instructions (Signed)
You may take Tylenol for pain.  Be sure to apply cool compresses to your eye.  If any new symptoms develop or symptoms worsen then return to the ER or see your ophthalmologist.  Be sure to see Dr. Ellie Lunch on 2/22 or 2/23.  Apply the ointment prescribed 3 times a day.
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
## 2020-11-03 ENCOUNTER — Encounter: Payer: Self-pay | Admitting: Internal Medicine

## 2020-11-05 ENCOUNTER — Encounter: Payer: Self-pay | Admitting: Internal Medicine

## 2020-11-06 DIAGNOSIS — H1133 Conjunctival hemorrhage, bilateral: Secondary | ICD-10-CM | POA: Diagnosis not present

## 2020-11-07 ENCOUNTER — Other Ambulatory Visit: Payer: Self-pay

## 2020-11-07 ENCOUNTER — Encounter: Payer: Self-pay | Admitting: Internal Medicine

## 2020-11-07 ENCOUNTER — Non-Acute Institutional Stay: Payer: Medicare Other | Admitting: Internal Medicine

## 2020-11-07 VITALS — BP 142/78 | HR 77 | Temp 97.5°F | Ht 67.0 in | Wt 176.0 lb

## 2020-11-07 DIAGNOSIS — M5030 Other cervical disc degeneration, unspecified cervical region: Secondary | ICD-10-CM

## 2020-11-07 DIAGNOSIS — S20211A Contusion of right front wall of thorax, initial encounter: Secondary | ICD-10-CM

## 2020-11-07 DIAGNOSIS — I2510 Atherosclerotic heart disease of native coronary artery without angina pectoris: Secondary | ICD-10-CM | POA: Insufficient documentation

## 2020-11-07 DIAGNOSIS — K22710 Barrett's esophagus with low grade dysplasia: Secondary | ICD-10-CM

## 2020-11-07 DIAGNOSIS — Z87828 Personal history of other (healed) physical injury and trauma: Secondary | ICD-10-CM

## 2020-11-07 DIAGNOSIS — M533 Sacrococcygeal disorders, not elsewhere classified: Secondary | ICD-10-CM

## 2020-11-07 DIAGNOSIS — I700 Atherosclerosis of aorta: Secondary | ICD-10-CM | POA: Diagnosis not present
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
## 2020-12-26 DIAGNOSIS — H52203 Unspecified astigmatism, bilateral: Secondary | ICD-10-CM | POA: Diagnosis not present

## 2020-12-26 DIAGNOSIS — H401112 Primary open-angle glaucoma, right eye, moderate stage: Secondary | ICD-10-CM | POA: Diagnosis not present

## 2020-12-26 DIAGNOSIS — Z961 Presence of intraocular lens: Secondary | ICD-10-CM | POA: Diagnosis not present

## 2020-12-26 DIAGNOSIS — H524 Presbyopia: Secondary | ICD-10-CM | POA: Diagnosis not present

## 2021-01-23 ENCOUNTER — Other Ambulatory Visit: Payer: Self-pay

## 2021-01-23 ENCOUNTER — Encounter: Payer: Self-pay | Admitting: Internal Medicine

## 2021-01-23 ENCOUNTER — Non-Acute Institutional Stay: Payer: Medicare Other | Admitting: Internal Medicine

## 2021-01-23 VITALS — BP 144/90 | HR 73 | Temp 96.5°F | Ht 67.0 in | Wt 172.2 lb

## 2021-01-23 DIAGNOSIS — R350 Frequency of micturition: Secondary | ICD-10-CM

## 2021-01-23 DIAGNOSIS — R739 Hyperglycemia, unspecified: Secondary | ICD-10-CM | POA: Diagnosis not present

## 2021-01-23 DIAGNOSIS — K22710 Barrett's esophagus with low grade dysplasia: Secondary | ICD-10-CM

## 2021-01-23 DIAGNOSIS — E782 Mixed hyperlipidemia: Secondary | ICD-10-CM | POA: Diagnosis not present

## 2021-01-23 DIAGNOSIS — D500 Iron deficiency anemia secondary to blood loss (chronic): Secondary | ICD-10-CM | POA: Diagnosis not present
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
## 2021-01-24 DIAGNOSIS — K22710 Barrett's esophagus with low grade dysplasia: Secondary | ICD-10-CM | POA: Diagnosis not present

## 2021-01-24 DIAGNOSIS — R739 Hyperglycemia, unspecified: Secondary | ICD-10-CM | POA: Diagnosis not present

## 2021-01-24 DIAGNOSIS — E782 Mixed hyperlipidemia: Secondary | ICD-10-CM | POA: Diagnosis not present

## 2021-01-24 LAB — BASIC METABOLIC PANEL
BUN: 11 (ref 4–21)
CO2: 28 — AB (ref 13–22)
Chloride: 101 (ref 99–108)
Creatinine: 1.1 (ref 0.6–1.3)
Glucose: 94
Potassium: 4 (ref 3.4–5.3)
Sodium: 140 (ref 137–147)

## 2021-01-24 LAB — HEMOGLOBIN A1C: Hemoglobin A1C: 6.1

## 2021-01-24 LAB — COMPREHENSIVE METABOLIC PANEL
Albumin: 4.2 (ref 3.5–5.0)
Calcium: 9.4 (ref 8.7–10.7)
GFR calc Af Amer: 76.26
GFR calc non Af Amer: 65.8
Globulin: 2.5

## 2021-01-24 LAB — CBC AND DIFFERENTIAL
HCT: 42 (ref 41–53)
Hemoglobin: 14.6 (ref 13.5–17.5)
Neutrophils Absolute: 3.8
Platelets: 231 (ref 150–399)
WBC: 6.1

## 2021-01-24 LAB — HEPATIC FUNCTION PANEL
ALT: 19 (ref 10–40)
AST: 25 (ref 14–40)
Alkaline Phosphatase: 95 (ref 25–125)
Bilirubin, Total: 0.5

## 2021-01-24 LAB — LIPID PANEL
Cholesterol: 172 (ref 0–200)
HDL: 47 (ref 35–70)
LDL Cholesterol: 77
LDl/HDL Ratio: 3.7
Triglycerides: 243 — AB (ref 40–160)

## 2021-01-24 LAB — TSH: TSH: 3.49 (ref 0.41–5.90)

## 2021-01-24 LAB — CBC: RBC: 4.67 (ref 3.87–5.11)

## 2021-01-25 ENCOUNTER — Telehealth: Payer: Self-pay

## 2021-01-25 NOTE — Telephone Encounter (Signed)
Per Dr. Lyndel Safe- Labs are all in good Level  A1C is 6.1 Cholesterol is good also   Discussed with the patient. No further questions.

## 2021-01-28 ENCOUNTER — Other Ambulatory Visit (HOSPITAL_BASED_OUTPATIENT_CLINIC_OR_DEPARTMENT_OTHER): Payer: Self-pay

## 2021-01-28 ENCOUNTER — Ambulatory Visit: Payer: Medicare Other | Attending: Internal Medicine

## 2021-01-28 ENCOUNTER — Other Ambulatory Visit: Payer: Self-pay

## 2021-01-28 DIAGNOSIS — Z23 Encounter for immunization: Secondary | ICD-10-CM

## 2021-01-28 MED ORDER — COVID-19 MRNA VACC (MODERNA) 100 MCG/0.5ML IM SUSP
INTRAMUSCULAR | 0 refills | Status: DC
  Filled 2021-01-28: qty 0.25, 1d supply, fill #0

## 2021-01-28 NOTE — Progress Notes (Signed)
   Covid-19 Vaccination Clinic  Name:  Ian Rowe    MRN: [id number] DOB: 01-08-1941  01/28/2021  [name] was observed post Covid-19 immunization for 15 minutes without incident. He was provided with Vaccine Information Sheet and instruction to access the V-Safe system.   [name] was instructed to call 911 with any severe reactions post vaccine: Marland Kitchen Difficulty breathing  . Swelling of face and throat  . A fast heartbeat  . A bad rash all over body  . Dizziness and weakness   Immunizations Administered    Name Date Dose VIS Date Route   Moderna Covid-19 Booster Vaccine 01/28/2021 11:08 AM 0.25 mL 07/04/2020 Intramuscular   Manufacturer: Moderna   Lot: [id number]   [place]: 98264-158-30

## 2021-01-29 ENCOUNTER — Telehealth: Payer: Self-pay

## 2021-01-29 NOTE — Telephone Encounter (Signed)
Labs received and abstracted into chart.

## 2021-01-29 NOTE — Telephone Encounter (Signed)
Message left on clinical intake voicemail:   Patient was called with lab results last week,  yet he would like an explanation as to why he can not access the results via mychart (as he has been able to in the past).  I returned called to patient and left a detailed message informing him of the process for labs done at [facility], I started by telling him that they do not automatically flow into mychart and have to be manually put into the system.   1.) Ideally they should be reviewed by the provider, then placed in a folder for the medical assistant 2.) Medical assistant,  abstracts, which makes them available to the patient via mychart 3.) Medical assistant calls patient with results and documents via telephone encounter  I also informed Ian Rowe in the message that I will forward to [place] and have her further address

## 2021-04-11 ENCOUNTER — Other Ambulatory Visit: Payer: Self-pay | Admitting: *Deleted

## 2021-04-11 MED ORDER — OMEPRAZOLE 40 MG PO CPDR: 40.0000 mg | DELAYED_RELEASE_CAPSULE | Freq: Two times a day (BID) | ORAL | 3 refills | Status: DC

## 2021-04-11 NOTE — Telephone Encounter (Signed)
Kristopher Oppenheim Requested refill.

## 2021-05-07 ENCOUNTER — Other Ambulatory Visit: Payer: Self-pay

## 2021-05-07 DIAGNOSIS — E782 Mixed hyperlipidemia: Secondary | ICD-10-CM

## 2021-05-07 MED ORDER — ROSUVASTATIN CALCIUM 5 MG PO TABS: 5.0000 mg | ORAL_TABLET | Freq: Every day | ORAL | 2 refills | Status: DC

## 2021-05-28 ENCOUNTER — Ambulatory Visit: Payer: Medicare Other | Admitting: Family Medicine

## 2021-06-20 DIAGNOSIS — Z23 Encounter for immunization: Secondary | ICD-10-CM | POA: Diagnosis not present

## 2021-07-05 DIAGNOSIS — H401123 Primary open-angle glaucoma, left eye, severe stage: Secondary | ICD-10-CM | POA: Diagnosis not present

## 2021-07-05 DIAGNOSIS — H401112 Primary open-angle glaucoma, right eye, moderate stage: Secondary | ICD-10-CM | POA: Diagnosis not present

## 2021-07-07 ENCOUNTER — Encounter: Payer: Self-pay | Admitting: Nurse Practitioner

## 2021-07-11 ENCOUNTER — Ambulatory Visit (INDEPENDENT_AMBULATORY_CARE_PROVIDER_SITE_OTHER): Payer: Medicare Other | Admitting: Nurse Practitioner

## 2021-07-11 ENCOUNTER — Other Ambulatory Visit: Payer: Self-pay

## 2021-07-11 ENCOUNTER — Telehealth: Payer: Self-pay

## 2021-07-11 ENCOUNTER — Encounter: Payer: Medicare Other | Admitting: Nurse Practitioner

## 2021-07-11 ENCOUNTER — Encounter: Payer: Self-pay | Admitting: Nurse Practitioner

## 2021-07-11 DIAGNOSIS — Z0000 Encounter for general adult medical examination without abnormal findings: Secondary | ICD-10-CM

## 2021-07-11 NOTE — Progress Notes (Signed)
This service is provided via telemedicine  No vital signs collected/recorded due to the encounter was a telemedicine visit.   Location of patient (ex: home, work):  Home  Patient consents to a telephone visit:  Yes, see encounter dated 07/11/2021  Location of the provider (ex: office, home):  [place]  Name of any referring provider:  Veleta Miners, MD  Names of all persons participating in the telemedicine service and their role in the encounter:  Sherrie Mustache, Nurse Practitioner, Carroll Kinds, CMA, and patient.   Time spent on call:  10 minutes with medical assistant

## 2021-07-11 NOTE — Telephone Encounter (Signed)
Mr. emin, foree are scheduled for a virtual visit with your provider today.    Just as we do with appointments in the office, we must obtain your consent to participate.  Your consent will be active for this visit and any virtual visit you may have with one of our providers in the next 365 days.    If you have a MyChart account, I can also send a copy of this consent to you electronically.  All virtual visits are billed to your insurance company just like a traditional visit in the office.  As this is a virtual visit, video technology does not allow for your provider to perform a traditional examination.  This may limit your provider's ability to fully assess your condition.  If your provider identifies any concerns that need to be evaluated in person or the need to arrange testing such as labs, EKG, etc, we will make arrangements to do so.    Although advances in technology are sophisticated, we cannot ensure that it will always work on either your end or our end.  If the connection with a video visit is poor, we may have to switch to a telephone visit.  With either a video or telephone visit, we are not always able to ensure that we have a secure connection.   I need to obtain your verbal consent now.   Are you willing to proceed with your visit today?   Ian Rowe has provided verbal consent on 07/11/2021 for a virtual visit (video or telephone).   Carroll Kinds, CMA 07/11/2021  11:10 AM

## 2021-07-11 NOTE — Progress Notes (Signed)
Subjective:   Ian Rowe is a 80 y.o. male who presents for Medicare Annual/Subsequent preventive examination.  Review of Systems     Cardiac Risk Factors include: advanced age (>8men, >5 women);family history of premature cardiovascular disease;dyslipidemia;male gender     Objective:    There were no vitals filed for this visit. There is no height or weight on file to calculate BMI.  Advanced Directives 07/11/2021 07/25/2020 07/10/2020 04/11/2020 01/25/2020 01/16/2020 07/27/2019  Does Patient Have a Medical Advance Directive? Yes Yes Yes Yes - Yes Yes  Type of [place] administrator Power of [place] of [place] of [place];Living will [place]  Does patient want to make changes to medical advance directive? No - Patient declined No - Patient declined No - Patient declined No - Patient declined - - No - Patient declined  Copy of [place] in Chart? Yes - validated most recent copy scanned in chart (See row information) - Yes - validated most recent copy scanned in chart (See row information) - Yes - validated most recent copy scanned in chart (See row information) No - copy requested Yes - validated most recent copy scanned in chart (See row information)  Pre-existing out of facility DNR order (yellow form or pink MOST form) - - - - - - -    Current Medications (verified) Outpatient Encounter Medications as of 07/11/2021  Medication Sig   b complex vitamins tablet Take 1 tablet by mouth daily.   bimatoprost (LUMIGAN) 0.01 % SOLN Place 1 drop into both eyes at bedtime.   cetirizine (ZYRTEC) 10 MG tablet Take 10 mg by mouth daily as needed for allergies.    Cholecalciferol (VITAMIN D) 2000 UNITS CAPS Take 1 capsule (2,000 Units total) by mouth daily.   folic acid (FOLVITE) 841 MCG tablet Take 800 mcg by mouth daily.     Glucosamine-Chondroit-Vit C-Mn (GLUCOSAMINE CHONDR 1500 COMPLX PO) Take 2 tablets by mouth daily.    omeprazole (PRILOSEC) 40 MG capsule Take 1 capsule (40 mg total) by mouth 2 (two) times daily before a meal.   rosuvastatin (CRESTOR) 5 MG tablet Take 1 tablet (5 mg total) by mouth daily.   timolol (TIMOPTIC) 0.5 % ophthalmic solution Place 1 drop into both eyes daily.    vitamin B-12 (CYANOCOBALAMIN) 1000 MCG tablet Take 1,000 mcg by mouth daily.   triamcinolone (NASACORT) 55 MCG/ACT AERO nasal inhaler Place 2 sprays into the nose daily as needed (congestion).   [DISCONTINUED] COVID-19 mRNA vaccine, Moderna, 100 MCG/0.5ML injection Inject into the muscle.   No facility-administered encounter medications on file as of 07/11/2021.    Allergies (verified) Black pepper [piper], Ciprofloxacin, and Nsaids   History: Past Medical History:  Diagnosis Date   Allergy    seasonal   Arthritis    knees   Cataract    BILATERAL REMOVED   Gastric polyp    GERD (gastroesophageal reflux disease)    Glaucoma    Hemorrhoids    Hyperlipidemia    Pneumonia    as teenager   RBBB    RBBB    Vasovagal syncope    last episode was 2015-16; none since   Wears glasses    Past Surgical History:  Procedure Laterality Date   BIOPSY  01/16/2020   Procedure: BIOPSY;  Surgeon: Irving Copas., MD;  Location: [place];  Service: Gastroenterology;;   CARDIAC CATHETERIZATION  1993   CATARACT EXTRACTION W/ INTRAOCULAR LENS  IMPLANT, BILATERAL  2021   COLONOSCOPY  2002; 2007   ENDOSCOPIC MUCOSAL RESECTION N/A 01/16/2020   Procedure: ENDOSCOPIC MUCOSAL RESECTION;  Surgeon: Rush Landmark Telford Nab., MD;  Location: [place];  Service: Gastroenterology;  Laterality: N/A;   ESOPHAGOGASTRODUODENOSCOPY (EGD) WITH PROPOFOL N/A 01/16/2020   Procedure: ESOPHAGOGASTRODUODENOSCOPY (EGD) WITH PROPOFOL;  Surgeon: Rush Landmark Telford Nab., MD;  Location: [place];  Service: Gastroenterology;  Laterality: N/A;    HEMOSTASIS CLIP PLACEMENT  01/16/2020   Procedure: HEMOSTASIS CLIP PLACEMENT;  Surgeon: Rush Landmark Telford Nab., MD;  Location: [place];  Service: Gastroenterology;;   HEMOSTASIS CONTROL  01/16/2020   Procedure: HEMOSTASIS CONTROL;  Surgeon: Irving Copas., MD;  Location: [place];  Service: Gastroenterology;;   POLYPECTOMY     SUBMUCOSAL LIFTING INJECTION  01/16/2020   Procedure: SUBMUCOSAL LIFTING INJECTION;  Surgeon: Irving Copas., MD;  [facility] END[facility]COPY;  Service: Gastroenterology;;   WISDOM TOOTH EXTRACTION     Family History  Problem Relation Age of Onset   Alzheimer's disease Mother    Heart disease Father    Colon cancer Neg Hx    Colon polyps Neg Hx    Esophageal cancer Neg Hx    Stomach cancer Neg Hx    Rectal cancer Neg Hx    Inflammatory bowel disease Neg Hx    Liver disease Neg Hx    Pancreatic cancer Neg Hx    Social History   Socioeconomic History   Marital status: Married    Spouse name: Mardene Celeste   Number of children: 2   Years of education: Not on file   Highest education level: Not on file  Occupational History   Occupation: Retired Clinical biochemist  Tobacco Use   Smoking status: Never   Smokeless tobacco: Never  Vaping Use   Vaping Use: Never used  Substance and Sexual Activity   Alcohol use: Yes    Alcohol/week: 10.0 standard drinks    Types: 7 Shots of liquor, 3 Glasses of wine per week    Comment: 2 scotch at bedtime nightly, occ glass of wine    Drug use: No   Sexual activity: Not on file  Other Topics Concern   Not on file  Social History Narrative   Moved to [place] 2014.   Married   Never smoked   Alcohol - scotch or wine nightly   Walks 150 minutes per week.    POA   Social Determinants of Radio broadcast assistant Strain: Not on file  Food Insecurity: Not on file  Transportation Needs: Not on file  Physical Activity: Not on file  Stress: Not on file  Social Connections: Not on  file    Tobacco Counseling Counseling given: Not Answered   Clinical Intake:  Pre-visit preparation completed: Yes  Pain : No/denies pain     BMI - recorded: 26 Nutritional Status: BMI 25 -29 Overweight (eating less) Nutritional Risks: Unintentional weight loss Diabetes: No  How often do you need to have someone help you when you read instructions, pamphlets, or other written materials from your doctor or pharmacy?: 1 - Never  Diabetic?no         Activities of Daily Living In your present state of health, do you have any difficulty performing the following activities: 07/11/2021  Hearing? N  Vision? N  Difficulty concentrating or making decisions? N  Walking or climbing stairs? N  Dressing or bathing? N  Doing errands, shopping? N  Preparing  Food and eating ? N  Using the Toilet? N  In the past six months, have you accidently leaked urine? N  Do you have problems with loss of bowel control? N  Managing your Medications? N  Managing your Finances? N  Housekeeping or managing your Housekeeping? N  Some recent data might be hidden    Patient Care Team: Virgie Dad, MD as PCP - General (Internal Medicine) [facility] Armbruster, Carlota Raspberry, MD as Consulting Physician (Gastroenterology) Mansouraty, Telford Nab., MD as Consulting Physician (Gastroenterology)  Indicate any recent Medical Services you may have received from other than [facility] providers in the past year (date may be approximate).     Assessment:   This is a routine wellness examination for [place].  Hearing/Vision screen Hearing Screening - Comments:: No hearing problems Vision Screening - Comments:: Patient wears glasses.  Dietary issues and exercise activities discussed: Current Exercise Habits: Home exercise routine, Type of exercise: walking;calisthenics, Time (Minutes): 30, Frequency (Times/Week): 7, Weekly Exercise (Minutes/Week): 210   Goals Addressed   None     Depression Screen PHQ 2/9 Scores 07/11/2021 07/10/2020 04/11/2020 07/27/2019 07/07/2019 01/19/2019 12/08/2018  PHQ - 2 Score 0 0 0 0 0 0 0    Fall Risk Fall Risk  07/11/2021 01/23/2021 07/25/2020 07/10/2020 04/11/2020  Falls in the past year? 0 0 0 0 0  Number falls in past yr: 0 0 0 0 0  Injury with Fall? 0 - 0 0 0  Risk for fall due to : No Fall Risks - - - -  Follow up Falls evaluation completed - - - -    FALL RISK PREVENTION PERTAINING TO THE HOME:  Any stairs in or around the home? No  If so, are there any without handrails? No  Home free of loose throw rugs in walkways, pet beds, electrical cords, etc? Yes  Adequate lighting in your home to reduce risk of falls? Yes   ASSISTIVE DEVICES UTILIZED TO PREVENT FALLS:  Life alert? No  Use of a cane, walker or w/c? No  Grab bars in the bathroom? Yes  Shower chair or bench in shower? Yes  Elevated toilet seat or a handicapped toilet? Yes   TIMED UP AND GO:  Was the test performed? No .    Cognitive Function: MMSE - Mini Mental State Exam 01/12/2018 12/31/2016 06/27/2015  Orientation to time 5 5 5   Orientation to Place 5 5 5   Registration 3 3 3   Attention/ Calculation 5 5 5   Recall 3 3 3   Language- name 2 objects 2 2 2   Language- repeat 1 1 1   Language- follow 3 step command 3 3 3   Language- read & follow direction 1 1 1   Write a sentence 1 1 1   Copy design 1 1 1   Total score 30 30 30      6CIT Screen 07/11/2021 07/10/2020 07/07/2019  What Year? 0 points 0 points 0 points  What month? 0 points 0 points 0 points  What time? 0 points 0 points 0 points  Count back from 20 0 points 0 points 0 points  Months in reverse 0 points 0 points 0 points  Repeat phrase 2 points 0 points 0 points  Total Score 2 0 0    Immunizations Immunization History  Administered Date(s) Administered   Fluad Quad(high Dose 65+) 06/20/2021   Influenza Whole 06/15/2012, 06/15/2013   Influenza, High Dose Seasonal PF 06/19/2016, 07/13/2020    Influenza, Quadrivalent, Recombinant, Inj, Pf 06/16/2019  Influenza,inj,Quad PF,6+ Mos 06/15/2018   Influenza-Unspecified 06/14/2014, 06/21/2015, 06/30/2017, 06/28/2020   Moderna SARS-COV2 Booster Vaccination 01/28/2021   Moderna Sars-Covid-2 Vaccination 09/27/2019, 10/26/2019, 07/31/2020   Pneumococcal Conjugate-13 10/31/2014   Pneumococcal Polysaccharide-23 09/16/2011   Tdap 12/08/2013, 11/02/2020   Unspecified SARS-COV-2 Vaccination 06/28/2021   Zoster Recombinat (Shingrix) 08/25/2017, 11/02/2017    TDAP status: Up to date  Flu Vaccine status: Up to date  Pneumococcal vaccine status: Up to date  Covid-19 vaccine status: Information provided on how to obtain vaccines.   Qualifies for Shingles Vaccine? Yes   Zostavax completed No   Shingrix Completed?: Yes  Screening Tests Health Maintenance  Topic Date Due   COVID-19 Vaccine (5 - Booster for Moderna series) 08/23/2021   TETANUS/TDAP  11/02/2030   Pneumonia Vaccine 22+ Years old  Completed   INFLUENZA VACCINE  Completed   Zoster Vaccines- Shingrix  Completed   HPV VACCINES  Aged Out    Health Maintenance  There are no preventive care reminders to display for this patient.  Colorectal cancer screening: No longer required.   Lung Cancer Screening: (Low Dose CT Chest recommended if Age 1-80 years, 30 pack-year currently smoking OR have quit w/in 15years.) does not qualify.   Lung Cancer Screening Referral: na  Additional Screening:  Hepatitis C Screening: does not qualify; Completed na  Vision Screening: Recommended annual ophthalmology exams for early detection of glaucoma and other disorders of the eye. Is the patient up to date with their annual eye exam?  Yes  Who is the provider or what is the name of the office in which the patient attends annual eye exams? [facility] If pt is not established with a provider, would they like to be referred to a provider to establish care? No .   Dental Screening: Recommended  annual dental exams for proper oral hygiene  Community Resource Referral / Chronic Care Management: CRR required this visit?  No   CCM required this visit?  No      Plan:     I have personally reviewed and noted the following in the patient's chart:   Medical and social history Use of alcohol, tobacco or illicit drugs  Current medications and supplements including opioid prescriptions. Patient is not currently taking opioid prescriptions. Functional ability and status Nutritional status Physical activity Advanced directives List of other physicians Hospitalizations, surgeries, and ER visits in previous 12 months Vitals Screenings to include cognitive, depression, and falls Referrals and appointments  In addition, I have reviewed and discussed with patient certain preventive protocols, quality metrics, and best practice recommendations. A written personalized care plan for preventive services as well as general preventive health recommendations were provided to patient.     Lauree Chandler, NP   07/11/2021    Virtual Visit via Telephone Note  I connected withNAME@ on 07/11/21 at 11:00 AM EDT by telephone and verified that I am speaking with the correct person using two identifiers.  Location: Patient: home Provider: [place]   I discussed the limitations, risks, security and privacy concerns of performing an evaluation and management service by telephone and the availability of in person appointments. I also discussed with the patient that there may be a patient responsible charge related to this service. The patient expressed understanding and agreed to proceed.   I discussed the assessment and treatment plan with the patient. The patient was provided an opportunity to ask questions and all were answered. The patient agreed with the plan and demonstrated an understanding of the  instructions.   The patient was advised to call back or seek an in-person evaluation if  the symptoms worsen or if the condition fails to improve as anticipated.  I provided 18 minutes of non-face-to-face time during this encounter.  Carlos American. Harle Battiest Avs printed and mailed

## 2021-07-11 NOTE — Patient Instructions (Signed)
Ian Rowe , Thank you for taking time to come for your Medicare Wellness Visit. I appreciate your ongoing commitment to your health goals. Please review the following plan we discussed and let me know if I can assist you in the future.   Screening recommendations/referrals: Colonoscopy aged out Recommended yearly ophthalmology/optometry visit for glaucoma screening and checkup Recommended yearly dental visit for hygiene and checkup  Vaccinations: Influenza vaccine up to date Pneumococcal vaccine up to date Tdap vaccine up to date Shingles vaccine up to date    Advanced directives: on file.   Conditions/risks identified: advance age, family hx of heart disease  Next appointment: yearly for AWV  Preventive Care 52 Years and Older, Male Preventive care refers to lifestyle choices and visits with your health care provider that can promote health and wellness. What does preventive care include? A yearly physical exam. This is also called an annual well check. Dental exams once or twice a year. Routine eye exams. Ask your health care provider how often you should have your eyes checked. Personal lifestyle choices, including: Daily care of your teeth and gums. Regular physical activity. Eating a healthy diet. Avoiding tobacco and drug use. Limiting alcohol use. Practicing safe sex. Taking low doses of aspirin every day. Taking vitamin and mineral supplements as recommended by your health care provider. What happens during an annual well check? The services and screenings done by your health care provider during your annual well check will depend on your age, overall health, lifestyle risk factors, and family history of disease. Counseling  Your health care provider may ask you questions about your: Alcohol use. Tobacco use. Drug use. Emotional well-being. Home and relationship well-being. Sexual activity. Eating habits. History of falls. Memory and ability to understand  (cognition). Work and work Statistician. Screening  You may have the following tests or measurements: Height, weight, and BMI. Blood pressure. Lipid and cholesterol levels. These may be checked every 5 years, or more frequently if you are over 54 years old. Skin check. Lung cancer screening. You may have this screening every year starting at age 58 if you have a 30-pack-year history of smoking and currently smoke or have quit within the past 15 years. Fecal occult blood test (FOBT) of the stool. You may have this test every year starting at age 79. Flexible sigmoidoscopy or colonoscopy. You may have a sigmoidoscopy every 5 years or a colonoscopy every 10 years starting at age 39. Prostate cancer screening. Recommendations will vary depending on your family history and other risks. Hepatitis C blood test. Hepatitis B blood test. Sexually transmitted disease (STD) testing. Diabetes screening. This is done by checking your blood sugar (glucose) after you have not eaten for a while (fasting). You may have this done every 1-3 years. Abdominal aortic aneurysm (AAA) screening. You may need this if you are a current or former smoker. Osteoporosis. You may be screened starting at age 41 if you are at high risk. Talk with your health care provider about your test results, treatment options, and if necessary, the need for more tests. Vaccines  Your health care provider may recommend certain vaccines, such as: Influenza vaccine. This is recommended every year. Tetanus, diphtheria, and acellular pertussis (Tdap, Td) vaccine. You may need a Td booster every 10 years. Zoster vaccine. You may need this after age 48. Pneumococcal 13-valent conjugate (PCV13) vaccine. One dose is recommended after age 23. Pneumococcal polysaccharide (PPSV23) vaccine. One dose is recommended after age 62. Talk to your health care  provider about which screenings and vaccines you need and how often you need them. This  information is not intended to replace advice given to you by your health care provider. Make sure you discuss any questions you have with your health care provider. Document Released: 09/28/2015 Document Revised: 05/21/2016 Document Reviewed: 07/03/2015 Elsevier Interactive Patient Education  2017 [place] Prevention in the Home Falls can cause injuries. They can happen to people of all ages. There are many things you can do to make your home safe and to help prevent falls. What can I do on the outside of my home? Regularly fix the edges of walkways and driveways and fix any cracks. Remove anything that might make you trip as you walk through a door, such as a raised step or threshold. Trim any bushes or trees on the path to your home. Use bright outdoor lighting. Clear any walking paths of anything that might make someone trip, such as rocks or tools. Regularly check to see if handrails are loose or broken. Make sure that both sides of any steps have handrails. Any raised decks and porches should have guardrails on the edges. Have any leaves, snow, or ice cleared regularly. Use sand or salt on walking paths during winter. Clean up any spills in your garage right away. This includes oil or grease spills. What can I do in the bathroom? Use night lights. Install grab bars by the toilet and in the tub and shower. Do not use towel bars as grab bars. Use non-skid mats or decals in the tub or shower. If you need to sit down in the shower, use a plastic, non-slip stool. Keep the floor dry. Clean up any water that spills on the floor as soon as it happens. Remove soap buildup in the tub or shower regularly. Attach bath mats securely with double-sided non-slip rug tape. Do not have throw rugs and other things on the floor that can make you trip. What can I do in the bedroom? Use night lights. Make sure that you have a light by your bed that is easy to reach. Do not use any sheets or  blankets that are too big for your bed. They should not hang down onto the floor. Have a firm chair that has side arms. You can use this for support while you get dressed. Do not have throw rugs and other things on the floor that can make you trip. What can I do in the kitchen? Clean up any spills right away. Avoid walking on wet floors. Keep items that you use a lot in easy-to-reach places. If you need to reach something above you, use a strong step stool that has a grab bar. Keep electrical cords out of the way. Do not use floor polish or wax that makes floors slippery. If you must use wax, use non-skid floor wax. Do not have throw rugs and other things on the floor that can make you trip. What can I do with my stairs? Do not leave any items on the stairs. Make sure that there are handrails on both sides of the stairs and use them. Fix handrails that are broken or loose. Make sure that handrails are as long as the stairways. Check any carpeting to make sure that it is firmly attached to the stairs. Fix any carpet that is loose or worn. Avoid having throw rugs at the top or bottom of the stairs. If you do have throw rugs, attach them to the  floor with carpet tape. Make sure that you have a light switch at the top of the stairs and the bottom of the stairs. If you do not have them, ask someone to add them for you. What else can I do to help prevent falls? Wear shoes that: Do not have high heels. Have rubber bottoms. Are comfortable and fit you well. Are closed at the toe. Do not wear sandals. If you use a stepladder: Make sure that it is fully opened. Do not climb a closed stepladder. Make sure that both sides of the stepladder are locked into place. Ask someone to hold it for you, if possible. Clearly mark and make sure that you can see: Any grab bars or handrails. First and last steps. Where the edge of each step is. Use tools that help you move around (mobility aids) if they are  needed. These include: Canes. Walkers. Scooters. Crutches. Turn on the lights when you go into a dark area. Replace any light bulbs as soon as they burn out. Set up your furniture so you have a clear path. Avoid moving your furniture around. If any of your floors are uneven, fix them. If there are any pets around you, be aware of where they are. Review your medicines with your doctor. Some medicines can make you feel dizzy. This can increase your chance of falling. Ask your doctor what other things that you can do to help prevent falls. This information is not intended to replace advice given to you by your health care provider. Make sure you discuss any questions you have with your health care provider. Document Released: 06/28/2009 Document Revised: 02/07/2016 Document Reviewed: 10/06/2014 Elsevier Interactive Patient Education  2017 [employer].

## 2021-07-29 ENCOUNTER — Other Ambulatory Visit: Payer: Self-pay

## 2021-07-29 ENCOUNTER — Encounter: Payer: Self-pay | Admitting: Adult Health

## 2021-07-29 ENCOUNTER — Non-Acute Institutional Stay: Payer: Medicare Other | Admitting: Adult Health

## 2021-07-29 VITALS — BP 164/90 | HR 78 | Temp 96.2°F | Ht 67.0 in | Wt 173.6 lb

## 2021-07-29 DIAGNOSIS — I700 Atherosclerosis of aorta: Secondary | ICD-10-CM

## 2021-07-29 DIAGNOSIS — R739 Hyperglycemia, unspecified: Secondary | ICD-10-CM

## 2021-07-29 DIAGNOSIS — K219 Gastro-esophageal reflux disease without esophagitis: Secondary | ICD-10-CM

## 2021-07-29 DIAGNOSIS — K22719 Barrett's esophagus with dysplasia, unspecified: Secondary | ICD-10-CM | POA: Diagnosis not present

## 2021-07-29 DIAGNOSIS — R142 Eructation: Secondary | ICD-10-CM

## 2021-07-29 DIAGNOSIS — M5030 Other cervical disc degeneration, unspecified cervical region: Secondary | ICD-10-CM

## 2021-07-29 DIAGNOSIS — R030 Elevated blood-pressure reading, without diagnosis of hypertension: Secondary | ICD-10-CM

## 2021-07-29 NOTE — Patient Instructions (Addendum)
Increase your activity level 4-5 days per week  Reduce sodium intake  Follow up with me in a week regarding your bp  Next apnt 08/05/21 @ 3:30p for blood pressure recheck.

## 2021-07-29 NOTE — Progress Notes (Signed)
Location: [facility]  POS: clinic  Provider:  Cindi Carbon, ANP [facility] [phone]    Goals of Care:  Advanced Directives 07/29/2021  Does Patient Have a Medical Advance Directive? Yes  Type of Advance Directive [place]  Does patient want to make changes to medical advance directive? No - Patient declined  Copy of [place] in Chart? Yes - validated most recent copy scanned in chart (See row information)  Pre-existing out of facility DNR order (yellow form or pink MOST form) -     Chief Complaint  Patient presents with   Medical Management of Chronic Issues    Patient returns to the clinic for her 6 month follow up.     HPI: Patient is a 80 y.o. male seen today for medical management of chronic diseases.   PMH significant for Barrett's esophagus, RBBB, aortic atherosclerosis, GERD, gastric polyps, right shoulder bursitis, BPH, DDD, OA, Vasovagal syncope, vid d def, gout, iron def, glaucoma, anemia, hyperglycemia.   Neck tingling on the left side with numbness and tingling happening 2-3 day, lasting 5 minutes, symptoms have been present for over a year. CT of the neck 11/02/20 showed multilevel degenerative changes and mild infiltrate of the left posterior triangle fat without discrete hematoma.   Discussed following up with Dr Havery Moros regarding Barrett's esophagus. Continues on PPI bid. Belching frequently despite avoiding triggers  BP high 172/102 , recheck 164/90  Urinating twice per night. Has difficulty finishing his when he urinates. No issues with weak stream. No blood or pain.    Past Medical History:  Diagnosis Date   Allergy    seasonal   Arthritis    knees   Cataract    BILATERAL REMOVED   Gastric polyp    GERD (gastroesophageal reflux disease)    Glaucoma    Hemorrhoids    Hyperlipidemia    Pneumonia    as teenager   RBBB    RBBB    Vasovagal syncope    last episode was 2015-16; none  since   Wears glasses     Past Surgical History:  Procedure Laterality Date   BIOPSY  01/16/2020   Procedure: BIOPSY;  Surgeon: Irving Copas., MD;  Location: [place];  Service: Gastroenterology;;   [place], BILATERAL  2021   COLONOSCOPY  2002; 2007   ENDOSCOPIC MUCOSAL RESECTION N/A 01/16/2020   Procedure: ENDOSCOPIC MUCOSAL RESECTION;  Surgeon: Irving Copas., MD;  Location: [place];  Service: Gastroenterology;  Laterality: N/A;   ESOPHAGOGASTRODUODENOSCOPY (EGD) WITH PROPOFOL N/A 01/16/2020   Procedure: ESOPHAGOGASTRODUODENOSCOPY (EGD) WITH PROPOFOL;  Surgeon: Rush Landmark Telford Nab., MD;  Location: [place];  Service: Gastroenterology;  Laterality: N/A;   HEMOSTASIS CLIP PLACEMENT  01/16/2020   Procedure: HEMOSTASIS CLIP PLACEMENT;  Surgeon: Irving Copas., MD;  Location: [place];  Service: Gastroenterology;;   HEMOSTASIS CONTROL  01/16/2020   Procedure: HEMOSTASIS CONTROL;  Surgeon: Irving Copas., MD;  Location: [place];  Service: Gastroenterology;;   POLYPECTOMY     SUBMUCOSAL LIFTING INJECTION  01/16/2020   Procedure: SUBMUCOSAL LIFTING INJECTION;  Surgeon: Irving Copas., MD;  Location: [place];  Service: Gastroenterology;;   WISDOM TOOTH EXTRACTION      Allergies  Allergen Reactions   Black Pepper [[place]] Other (See Comments)    bloating   Ciprofloxacin Other (See Comments)    Bleeding-blood in stool    Nsaids  History of bleeding    Outpatient Encounter Medications as of 07/29/2021  Medication Sig   b complex vitamins tablet Take 1 tablet by mouth daily.   bimatoprost (LUMIGAN) 0.01 % SOLN Place 1 drop into both eyes at bedtime.   cetirizine (ZYRTEC) 10 MG tablet Take 10 mg by mouth daily as needed for allergies.    Cholecalciferol (VITAMIN D) 2000 UNITS CAPS Take 1 capsule (2,000 Units total) by mouth daily.   fluticasone  (FLONASE) 50 MCG/ACT nasal spray Place 2 sprays into both nostrils daily as needed for allergies or rhinitis.   folic acid (FOLVITE) 662 MCG tablet Take 800 mcg by mouth daily.    Glucosamine-Chondroit-Vit C-Mn (GLUCOSAMINE CHONDR 1500 COMPLX PO) Take 2 tablets by mouth daily.    omeprazole (PRILOSEC) 40 MG capsule Take 1 capsule (40 mg total) by mouth 2 (two) times daily before a meal.   rosuvastatin (CRESTOR) 5 MG tablet Take 1 tablet (5 mg total) by mouth daily.   timolol (TIMOPTIC) 0.5 % ophthalmic solution Place 1 drop into both eyes daily.    vitamin B-12 (CYANOCOBALAMIN) 1000 MCG tablet Take 1,000 mcg by mouth daily.   [DISCONTINUED] triamcinolone (NASACORT) 55 MCG/ACT AERO nasal inhaler Place 2 sprays into the nose daily as needed (congestion).   No facility-administered encounter medications on file as of 07/29/2021.    Review of Systems:  Review of Systems  Constitutional:  Negative for activity change, appetite change, chills, diaphoresis, fatigue, fever and unexpected weight change.  HENT:  Negative for congestion and trouble swallowing.   Respiratory:  Negative for cough, shortness of breath, wheezing and stridor.   Cardiovascular:  Negative for chest pain, palpitations and leg swelling.  Gastrointestinal:  Negative for abdominal distention, abdominal pain, constipation and diarrhea.  Genitourinary:  Positive for frequency. Negative for difficulty urinating and dysuria.  Musculoskeletal:  Negative for arthralgias, back pain, gait problem, joint swelling and myalgias.  Neurological:  Positive for numbness. Negative for dizziness, seizures, syncope, facial asymmetry, speech difficulty, weakness and headaches.  Hematological:  Negative for adenopathy. Does not bruise/bleed easily.  Psychiatric/Behavioral:  Negative for agitation, behavioral problems and confusion.    Health Maintenance  Topic Date Due   COVID-19 Vaccine (5 - Booster for Moderna series) 08/23/2021   TETANUS/TDAP   11/02/2030   Pneumonia Vaccine 72+ Years old  Completed   INFLUENZA VACCINE  Completed   Zoster Vaccines- Shingrix  Completed   HPV VACCINES  Aged Out    Physical Exam: Vitals:   07/29/21 1327  BP: (!) 164/90  Pulse: 78  Temp: (!) 96.2 F (35.7 C)  SpO2: 100%  Weight: 173 lb 9.6 oz (78.7 kg)  Height: 5\' 7"  (1.702 m)   Body mass index is 27.19 kg/m. Physical Exam Vitals reviewed.  Constitutional:      General: He is not in acute distress.    Appearance: He is not diaphoretic.  HENT:     Head: Normocephalic and atraumatic.     Right Ear: Tympanic membrane and ear canal normal.     Left Ear: Tympanic membrane and ear canal normal.     Nose: Nose normal. No congestion.     Mouth/Throat:     Mouth: Mucous membranes are moist.     Pharynx: Oropharynx is clear.  Eyes:     Conjunctiva/sclera: Conjunctivae normal.     Pupils: Pupils are equal, round, and reactive to light.  Neck:     Thyroid: No thyroid mass, thyromegaly or thyroid tenderness.  Vascular: No JVD.     Trachea: No tracheal deviation.  Cardiovascular:     Rate and Rhythm: Normal rate and regular rhythm.     Heart sounds: No murmur heard. Pulmonary:     Effort: Pulmonary effort is normal. No respiratory distress.     Breath sounds: Normal breath sounds. No wheezing.  Abdominal:     General: Bowel sounds are normal. There is no distension.     Palpations: Abdomen is soft.     Tenderness: There is no abdominal tenderness.     Hernia: A hernia (ventral, non tender and reducible) is present.  Musculoskeletal:     Right shoulder: Normal. No swelling.     Left shoulder: Normal. No swelling.     Cervical back: Normal range of motion and neck supple. No edema, erythema, signs of trauma, torticollis or crepitus. No pain with movement, spinous process tenderness or muscular tenderness. Normal range of motion.     Right lower leg: No edema.     Left lower leg: No edema.  Lymphadenopathy:     Cervical: No cervical  adenopathy.  Skin:    General: Skin is warm and dry.  Neurological:     Mental Status: He is alert and oriented to person, place, and time.     Cranial Nerves: No cranial nerve deficit.  Psychiatric:        Mood and Affect: Mood normal.    Labs reviewed: Basic Metabolic Panel: Recent Labs    11/02/20 1519 11/02/20 1525 01/24/21 0000 01/24/21 1300  NA 138 134*  --  140  K 4.0 3.9  --  4.0  CL 100 99  --  101  CO2  --  25  --  28*  GLUCOSE 139* 140*  --   --   BUN 18 15  --  11  CREATININE 1.00 1.16  --  1.1  CALCIUM  --  8.7* 9.4  --   TSH  --   --   --  3.49   Liver Function Tests: Recent Labs    11/02/20 1525 01/24/21 0000 01/24/21 1300  AST 29  --  25  ALT 24  --  19  ALKPHOS 76  --  95  BILITOT 0.9  --   --   PROT 6.5  --   --   ALBUMIN 3.4* 4.2  --    No results for input(s): LIPASE, AMYLASE in the last 8760 hours. No results for input(s): AMMONIA in the last 8760 hours. CBC: Recent Labs    09/19/20 1557 11/02/20 1519 11/02/20 1525 01/24/21 0000  WBC 9.1  --  8.2 6.1  NEUTROABS  --   --   --  3.80  HGB 15.5 13.9 14.5 14.6  HCT 44.9 41.0 43.1 42  MCV 92.6  --  93.9  --   PLT 261.0  --  220 231   Lipid Panel: Recent Labs    01/24/21 0000  CHOL 172  HDL 47  LDLCALC 77  TRIG 243*   Lab Results  Component Value Date   HGBA1C 6.1 01/24/2021    Procedures since last visit: No results found.  Assessment/Plan  1. Barrett's esophagus with dysplasia Recommend that he f/u with GI each year and continue PPI therapy  2. DDD (degenerative disc disease), cervical The symptom of neck numbness/tingling may be a related finding to this dx. I discussed this with Dr. Lyndel Safe and we agreed to referral to neurosurgery and MRI of the neck. Mr.  Boffa would like to hold off for now as it is improving and not causing any pain.   3. Elevated blood-pressure reading without diagnosis of hypertension Increase your activity level 4-5 days per week Reduce  sodium intake Follow up with me in a week regarding your bp  4. Hyperglycemia Last fasting glucose 94 01/24/21, will recheck BMP  5. Gastroesophageal reflux disease, unspecified whether esophagitis present Continues on PPI therapy bid  6. Aortic atherosclerosis ([place]) Noted on CT, continues on a statin  7. Belching Avoid straws, eating too fast, carbonated beverages    Next apt 08/05/21 @ 3:30p for blood pressure recheck . Labs/tests ordered:  * No order type specified * CBC BMP next week  Next appt:  1 week   Total time 50min:  time greater than 50% of total time spent doing pt counseling and coordination of care

## 2021-08-01 DIAGNOSIS — D519 Vitamin B12 deficiency anemia, unspecified: Secondary | ICD-10-CM | POA: Diagnosis not present

## 2021-08-01 DIAGNOSIS — I700 Atherosclerosis of aorta: Secondary | ICD-10-CM | POA: Diagnosis not present

## 2021-08-01 LAB — CBC AND DIFFERENTIAL
HCT: 44 (ref 41–53)
Hemoglobin: 15.4 (ref 13.5–17.5)
Platelets: 238 (ref 150–399)
WBC: 7.1

## 2021-08-01 LAB — BASIC METABOLIC PANEL
BUN: 11 (ref 4–21)
CO2: 26 — AB (ref 13–22)
Chloride: 103 (ref 99–108)
Creatinine: 1 (ref 0.6–1.3)
Glucose: 110
Potassium: 4.5 (ref 3.4–5.3)
Sodium: 144 (ref 137–147)

## 2021-08-01 LAB — CBC: RBC: 4.75 (ref 3.87–5.11)

## 2021-08-01 LAB — COMPREHENSIVE METABOLIC PANEL: Calcium: 9.3 (ref 8.7–10.7)

## 2021-08-05 ENCOUNTER — Encounter: Payer: Self-pay | Admitting: Internal Medicine

## 2021-08-05 ENCOUNTER — Non-Acute Institutional Stay: Payer: Medicare Other | Admitting: Internal Medicine

## 2021-08-05 ENCOUNTER — Other Ambulatory Visit: Payer: Self-pay

## 2021-08-05 VITALS — BP 178/95 | HR 62 | Temp 96.2°F | Ht 67.0 in | Wt 173.4 lb

## 2021-08-05 DIAGNOSIS — I10 Essential (primary) hypertension: Secondary | ICD-10-CM

## 2021-08-05 DIAGNOSIS — K22719 Barrett's esophagus with dysplasia, unspecified: Secondary | ICD-10-CM

## 2021-08-05 MED ORDER — AMLODIPINE BESYLATE 2.5 MG PO TABS: 2.5000 mg | ORAL_TABLET | Freq: Every day | ORAL | 3 refills | Status: DC
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
## 2021-08-28 ENCOUNTER — Other Ambulatory Visit: Payer: Self-pay

## 2021-08-28 ENCOUNTER — Non-Acute Institutional Stay: Payer: Medicare Other | Admitting: Internal Medicine

## 2021-08-28 ENCOUNTER — Encounter: Payer: Self-pay | Admitting: Internal Medicine

## 2021-08-28 VITALS — BP 138/84 | HR 62 | Temp 97.5°F | Ht 67.0 in | Wt 170.0 lb

## 2021-08-28 DIAGNOSIS — E782 Mixed hyperlipidemia: Secondary | ICD-10-CM | POA: Diagnosis not present

## 2021-08-28 DIAGNOSIS — I10 Essential (primary) hypertension: Secondary | ICD-10-CM | POA: Diagnosis not present

## 2021-08-28 DIAGNOSIS — K22719 Barrett's esophagus with dysplasia, unspecified: Secondary | ICD-10-CM | POA: Diagnosis not present
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
## 2021-09-24 ENCOUNTER — Encounter: Payer: Self-pay | Admitting: Gastroenterology

## 2021-09-24 ENCOUNTER — Ambulatory Visit: Payer: Medicare Other | Admitting: Gastroenterology

## 2021-09-24 VITALS — BP 130/80 | HR 71 | Ht 67.0 in | Wt 172.0 lb

## 2021-09-24 DIAGNOSIS — Z79899 Other long term (current) drug therapy: Secondary | ICD-10-CM | POA: Diagnosis not present

## 2021-09-24 DIAGNOSIS — K22710 Barrett's esophagus with low grade dysplasia: Secondary | ICD-10-CM

## 2021-09-24 DIAGNOSIS — K317 Polyp of stomach and duodenum: Secondary | ICD-10-CM

## 2021-09-24 DIAGNOSIS — R142 Eructation: Secondary | ICD-10-CM | POA: Diagnosis not present

## 2021-09-24 DIAGNOSIS — K219 Gastro-esophageal reflux disease without esophagitis: Secondary | ICD-10-CM | POA: Diagnosis not present

## 2021-09-24 NOTE — Patient Instructions (Addendum)
If you are age 83 or older, your body mass index should be between 23-30. Your Body mass index is 26.94 kg/m. If this is out of the aforementioned range listed, please consider follow up with your Primary Care Provider.  If you are age 41 or younger, your body mass index should be between 19-25. Your Body mass index is 26.94 kg/m. If this is out of the aformentioned range listed, please consider follow up with your Primary Care Provider.   ________________________________________________________  The [facility] providers would like to encourage you to use [facility] to communicate with providers for non-urgent requests or questions.  Due to long hold times on the telephone, sending your provider a message by [facility] may be a faster and more efficient way to get a response.  Please allow 48 business hours for a response.  Please remember that this is for non-urgent requests.  _______________________________________________________  Continue omeprazole  You will be contacted by a member of Dr. Donneta Romberg staff regarding scheduling you for an EGD in May.   Thank you for entrusting me with your care and for choosing [facility], Dr. Switzer Cellar

## 2021-09-24 NOTE — Progress Notes (Signed)
HPI :  81 year old male here for follow-up visit for belching, Barrett's esophagus, gastric polyps.  Recall that he had an advanced TVA removed from his colon in May 2017, had a follow-up colonoscopy with me in September 2020 with 2 diminutive adenomas.  No further colonoscopy surveillance was recommended due to age.  He has a history of GERD for years on chronic PPI and had worsening of belching symptoms, I performed an EGD for him in March 2021 showing a moderate sized hiatal hernia with short segment of Barrett's esophagus.  Biopsies of the site showed some low-grade dysplasia.  He also had numerous/large inflamed hyperplastic gastric polyps which were biopsied.  I referred him to Dr. Rush Landmark who repeated an endoscopy in May 2021.  Short segment of Barrett's esophagus again noted with biopsies obtained,, 1 area indefinite for dysplasia.  He also had 7 gastric polyps resected pathology was all consistent with hyperplastic polyps without dysplasia.  He did have a 30 mm gastric polyp remaining that was not removed at this time due to the number of polyps that were already removed.  Patient states he has been taking omeprazole once to twice daily for his reflux symptoms and that generally controls his heartburn and reflux symptoms.  His main complaint is ongoing symptoms of belching.  He states usually after he eats something or at night he will have belching for upwards of 20 to 30 minutes continuously and then it may stop.  He denies drinking any carbonated beverages, does not chew gum, states he does not eat fast.  He has tried some Gas-X which has not helped.  He thinks eating Peppercorn can make his symptoms worse so he tries to avoid that.  I asked him if he thinks he swallows air when he is eating and he thinks he may.  He previously had a mild anemia but that has resolved, his last hemoglobin 14.5 as of November.  He denies any dysphagia.  No abdominal pains or nausea vomiting.  Otherwise feels  well.  Endoscopic history:  Colonoscopy 01/23/2016 - One 4 mm polyp in the cecum, removed with a cold snare. Resected and retrieved. - One diminutive polyp in the ascending colon, removed with a cold biopsy forceps. Resected and retrieved. - One 6 mm polyp in the ascending colon, removed with a hot snare. Resected and retrieved. - One 20 mm polyp in the sigmoid colon, removed with a hot snare. Resected and retrieved. - Non-bleeding internal hemorrhoids. - The examination was otherwise normal.   Colonoscopy 05/20/2019 - The perianal and digital rectal examinations were normal. - A diminutive polyp was found in the cecum. The polyp was sessile. The polyp was removed with a cold snare. Resection and retrieval were complete. - A diminutive polyp was found in the transverse colon. The polyp was sessile. The polyp was removed with a cold snare. Resection and retrieval were complete. - Internal hemorrhoids were found during retroflexion. The hemorrhoids were small. - The exam was otherwise without abnormality.  Surgical [P], colon, transverse and cecum, polyp (2) - TUBULAR ADENOMA WITHOUT HIGH-GRADE DYSPLASIA OR MALIGNANCY - OTHER FRAGMENT OF POLYPOID COLONIC MUCOSA WITH A PROMINENT LYMPHOID AGGREGATE  EGD 11/23/2019 -  - A 4 cm hiatal hernia was present. - There were esophageal mucosal changes classified as Barrett's stage C0-M1 per Prague criteria present in the lower third of the esophagus. The maximum longitudinal extent of these mucosal changes was 1 cm in length. Biopsies were taken with a cold forceps for histology. -  The exam of the esophagus was otherwise normal. - Multiple pedunculated and sessile polyps were found in the gastric fundus and in the gastric body. The polyps ranged from a few mm to upwards of 2-3 cm in size (2 large polyps in particular were a few cms in size - one in the fundus another in the distal gastric body), all erythematous, suspect inflammatory / fundic gland  polyps. Biopsies were taken with a cold forceps for histology. - Patchy mildly erythematous mucosa was found in the gastric body. - The exam of the stomach was otherwise normal. - Biopsies were taken with a cold forceps in the gastric body, at the incisura and in the gastric antrum for Helicobacter pylori testing. 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 Biopsies were taken with a cold forceps for histology. 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 - The exam of the duodenum was otherwise normal.  1. Surgical [P], duodenal bulb - GASTRIC TYPE MUCOSA WITH REACTIVE CHANGES AND CHRONIC INFLAMMATION. - NO INTESTINAL METAPLASIA, DYSPLASIA, OR MALIGNANCY. 2. Surgical [P], gastric polyps - HYPERPLASTIC POLYP WITH ULCERATION. - NO INTESTINAL METAPLASIA, DYSPLASIA, OR MALIGNANCY. 3. Surgical [P], gastric antrum and gastric body - REACTIVE GASTROPATHY WITH CHRONIC INFLAMMATION. H[place] Dyer IS NEGATIVE FOR HELICOBACTER PYLORI. - NO INTESTINAL METAPLASIA, DYSPLASIA, OR MALIGNANCY. 4. Surgical [P], lower esophagus - INTESTINAL METAPLASIA (BARRETT'S ESOPHAGUS) WITH AT LEAST LOW GRADE DYSPLASIA, SEE COMMENT. 4. There is focal dysplasia which is at least low grade, but approaching high grade in nature. p53 supports the diagnosis of dysplasia. Dr. Vic Ripper has reviewed the case.    EGD 01/16/2020 - Dr. Rush Landmark No gross lesions were noted in the proximal esophagus and in the mid esophagus. The esophagus and gastroesophageal junction were examined with white light and narrow band imaging (NBI) from a forward view and retroflexed position. There were esophageal mucosal changes consistent with short-segment Barrett's esophagus. These changes involved the mucosa along an irregular Z-line (34 cm from the incisors). Circumferential salmon-colored mucosa was present from 33 to 34 cm,  three tongues of salmon-colored mucosa were present from 32 to 34 cm and scattered islands of salmoncolored mucosa were present at 32 cm. The maximum longitudinal extent of these esophageal mucosal changes was 2 cm in length. Mucosa was biopsied with a cold forceps for histology in a targeted and 4 quadrants manner at intervals of 1 cm from 32 to 34 cm from the incisors. A total of 3 specimen bottles were sent to pathology. A medium-sized hiatal hernia was found. The proximal extent of the gastric folds (end of tubular esophagus) was 34 cm from the incisors. The hiatal narrowing was 38 cm from the incisors. Three 15 to 20 mm pedunculated polyps with no bleeding and stigmata of recent bleeding were found in the gastric body. Preparations were made for mucosal resection. A 1:100,000 solution of epinephrine was injected to raise the lesions. Snare mucosal resection was performed. Resection and retrieval were complete. To prevent bleeding post-intervention, eight hemostatic clips were successfully placed (MR conditional) - 2 on Distal Body #1, 3 on Distal body #2, 3 on Proximal Body #1. There was no bleeding at the end of the procedure. Four 8 to 12 mm semi-sessile polyps with no bleeding and stigmata of recent bleeding were found in the gastric body. Preparations were made for mucosal resection. Saline was injected to raise the lesions. Snare mucosal resection was performed. Resection  and retrieval were complete. To prevent bleeding post-intervention, ten hemostatic clips were successfully placed (MR conditional) - 2 on Middle Body #1, 1 on Middle Body #2, 3 on Distal Body #3, 4 on Proximal Body #2. There was no bleeding at the end of the procedure. A single 30 mm pedunculated polyp with no bleeding was found in the gastric body. Polypectomy was not attempted due to extensive resections today. This can be entertained and may need a polyloop. Multiple 3 to 6 mm semi-sessile polyps with no bleeding  and stigmata of recent bleeding were found in the cardia, in the gastric fundus and in the gastric body. No gross lesions were noted in the duodenal bulb, in the first portion of the duodenum and in the second portion of the duodenum. -  FINAL MICROSCOPIC DIAGNOSIS:   A. STOMACH, POLYPECTOMY:  - Gastric hyperplastic polyp(s) with ulceration  - Negative for intestinal metaplasia, dysplasia or malignancy   B. ESOPHAGUS, AT 34CM, BIOPSY:  - Barretts esophagus, negative for dysplasia   C. ESOPHAGUS, AT 33CM, BIOPSY:  - Barretts esophagus, negative for dysplasia   D. ESOPHAGUS, AT 32CM, BIOPSY:  - Barrett's esophagus, focally indefinite for dysplasia.  See comment    COMMENT:   D.  The Barrett's mucosa shows a few isolated glands with enlarged and  hyperchromatic nuclei, worrisome for high-grade dysplasia.  Immunohistochemical stain for p53 shows a single gland with clonal-like  overexpression pattern.  But this focus is very small and maturation  towards the surface cannot be evaluated due to stripping of the surface  mucosa.  Hence, the changes are interpreted as indefinite for dysplasia.      Past Medical History:  Diagnosis Date   Allergy    seasonal   Arthritis    knees   Barrett's esophagus    Cataract    BILATERAL REMOVED   Gastric polyp    GERD (gastroesophageal reflux disease)    Glaucoma    Hemorrhoids    Hyperlipidemia    Pneumonia    as teenager   RBBB    RBBB    Vasovagal syncope    last episode was 2015-16; none since   Wears glasses      Past Surgical History:  Procedure Laterality Date   BIOPSY  01/16/2020   Procedure: BIOPSY;  Surgeon: Irving Copas., MD;  Location: [facility] ENDOSCOPY;  Service: Gastroenterology;;   [place], BILATERAL  2021   COLONOSCOPY  2002; 2007   ENDOSCOPIC MUCOSAL RESECTION N/A 01/16/2020   Procedure: ENDOSCOPIC MUCOSAL RESECTION;  Surgeon:  Irving Copas., MD;  Location: [place];  Service: Gastroenterology;  Laterality: N/A;   ESOPHAGOGASTRODUODENOSCOPY (EGD) WITH PROPOFOL N/A 01/16/2020   Procedure: ESOPHAGOGASTRODUODENOSCOPY (EGD) WITH PROPOFOL;  Surgeon: Rush Landmark Telford Nab., MD;  Location: [place];  Service: Gastroenterology;  Laterality: N/A;   HEMOSTASIS CLIP PLACEMENT  01/16/2020   Procedure: HEMOSTASIS CLIP PLACEMENT;  Surgeon: Irving Copas., MD;  Location: [place];  Service: Gastroenterology;;   HEMOSTASIS CONTROL  01/16/2020   Procedure: HEMOSTASIS CONTROL;  Surgeon: Irving Copas., MD;  Location: [place];  Service: Gastroenterology;;   POLYPECTOMY     SUBMUCOSAL LIFTING INJECTION  01/16/2020   Procedure: SUBMUCOSAL LIFTING INJECTION;  Surgeon: Irving Copas., MD;  Location: [facility] ENDOSCOPY;  Service: Gastroenterology;;   WISDOM TOOTH EXTRACTION     Family History  Problem Relation Age of Onset   Alzheimer's disease Mother  Heart disease Father    Colon cancer Neg Hx    Colon polyps Neg Hx    Esophageal cancer Neg Hx    Stomach cancer Neg Hx    Rectal cancer Neg Hx    Inflammatory bowel disease Neg Hx    Liver disease Neg Hx    Pancreatic cancer Neg Hx    Social History   Tobacco Use   Smoking status: Never   Smokeless tobacco: Never  Vaping Use   Vaping Use: Never used  Substance Use Topics   Alcohol use: Yes    Alcohol/week: 10.0 standard drinks    Types: 7 Shots of liquor, 3 Glasses of wine per week    Comment: 2 scotch at bedtime nightly, occ glass of wine    Drug use: No   Current Outpatient Medications  Medication Sig Dispense Refill   amLODipine (NORVASC) 2.5 MG tablet Take 1 tablet (2.5 mg total) by mouth daily. 60 tablet 3   b complex vitamins tablet Take 1 tablet by mouth daily.     bimatoprost (LUMIGAN) 0.01 % SOLN Place 1 drop into both eyes at bedtime.     cetirizine (ZYRTEC) 10 MG tablet Take 10 mg by mouth daily as needed for  allergies.      Cholecalciferol (VITAMIN D) 2000 UNITS CAPS Take 1 capsule (2,000 Units total) by mouth daily. 30 capsule 3   fluticasone (FLONASE) 50 MCG/ACT nasal spray Place 2 sprays into both nostrils daily as needed for allergies or rhinitis.     folic acid (FOLVITE) 941 MCG tablet Take 800 mcg by mouth daily.      Glucosamine-Chondroit-Vit C-Mn (GLUCOSAMINE CHONDR 1500 COMPLX PO) Take 2 tablets by mouth daily.      omeprazole (PRILOSEC) 40 MG capsule Take 1 capsule (40 mg total) by mouth 2 (two) times daily before a meal. 180 capsule 3   rosuvastatin (CRESTOR) 5 MG tablet Take 1 tablet (5 mg total) by mouth daily. 90 tablet 2   timolol (TIMOPTIC) 0.5 % ophthalmic solution Place 1 drop into both eyes daily.      vitamin B-12 (CYANOCOBALAMIN) 1000 MCG tablet Take 1,000 mcg by mouth daily.     No current facility-administered medications for this visit.   Allergies  Allergen Reactions   Black Pepper [[place]] Other (See Comments)    bloating   Ciprofloxacin Other (See Comments)    Bleeding-blood in stool    Nsaids     History of bleeding     Review of Systems: All systems reviewed and negative except where noted in HPI.    Lab Results  Component Value Date   WBC 7.1 08/01/2021   HGB 15.4 08/01/2021   HCT 44 08/01/2021   MCV 93.9 11/02/2020   PLT 238 08/01/2021   Lab Results  Component Value Date   CREATININE 1.0 08/01/2021   BUN 11 08/01/2021   NA 144 08/01/2021   K 4.5 08/01/2021   CL 103 08/01/2021   CO2 26 (A) 08/01/2021    Lab Results  Component Value Date   ALT 19 01/24/2021   AST 25 01/24/2021   ALKPHOS 95 01/24/2021   BILITOT 0.9 11/02/2020     Physical Exam: BP 130/80    Pulse 71    Ht 5' 7"  (1.702 m)    Wt 172 lb (78 kg)    SpO2 98%    BMI 26.94 kg/m  Constitutional: Pleasant,well-developed, male in no acute distress. Neurological: Alert and oriented to person place and time.  Psychiatric: Normal mood and affect. Behavior is normal.   ASSESSMENT  AND PLAN: 81 year old male here for reassessment of following:  Barrett's esophagus - history of biopsies showing low-grade dysplasia/indefinite for dysplasia GERD Long term use of PPI Gastric polyps Belching  Reviewed the patient's history of Barrett's esophagus with him, most recent endoscopies with question of indefinite for dysplasia/low-grade dysplasia.  He is on PPI with good control of his index reflux symptoms.  Given findings on his most recent exam I do think he warrants a surveillance endoscopy, discussed risk benefits of this, and risks for progression of malignancy from dysplasia in the setting of Barrett's.  He is agreeable to a surveillance endoscopy, it is greater than 1 year since his last exam.  In regards to where we should do this, in light of his history of large gastric polyp that remains (26m suspected hyperplastic polyp noted in 2021), recommend his exam be done at the hospital to have that removed given risks of continued growth, anemia, etc. Dr. MRush Landmarkhas taken care of his gastric polyps in the past and will see if he is able to help out with this procedure.  Patient understands risks of the exam and wishes to proceed.  He will continue his omeprazole for now.  I did discuss long-term risks benefits of chronic PPI use and feel benefits outweigh risks.  Otherwise we spent some time discussing his belching.  This is a chronic issue at this point and bothers him usually at night.  I reassured him this is not anything that is usually associated with any concerning pathology.  He had imaging of his chest abdomen pelvis last year which did not show any concerning findings.  I suspect he probably has supra gastric belching perhaps from aerophagia. He should try to avoid any trigger foods if he has them. Otherwise, if this really bothers him I can refer him for diaphragmatic breathing to see if that will help, I do not think reflux is causing the level of belching he is  experiencing.  He has tried Gas-X and other conservative measures, avoids drinking carbonated beverages, none of which have really helped.  Reassured him and if he wishes to have further therapy for diaphragmatic breathing teaching he can let me know, he wants to hold off on this for now.  Plan: - continue omeprazole, discussed long term risks / benefits - refer for EGD in the hospital for surveillance of Barrett's and removal of large gastric polyp, will see if Dr. MRush Landmarkcan help with this and get back to him regarding scheduling - reassured him about supragastric belching, he declines referral for diaphragmatic breathing  SJolly Mango MD [facility]

## 2021-10-04 ENCOUNTER — Telehealth: Payer: Self-pay

## 2021-10-04 ENCOUNTER — Telehealth: Payer: Self-pay | Admitting: Gastroenterology

## 2021-10-04 NOTE — Telephone Encounter (Signed)
Called and spoke with patient. He states that he does not currently have his calendar. He will give me a call back to schedule.

## 2021-10-04 NOTE — Telephone Encounter (Signed)
-----   Message from Yetta Flock, MD sent at 10/04/2021 12:38 PM EST ----- Regarding: RE: follow up EGD Okay that sounds reasonable, thanks for the follow up. I spoke with Ian Rowe about it and he is agreeable. Initially he was a bit anxious about the polyp and wanted it out, but I can take a look first at [facility], will take care of it.  [name] ----- Message ----- From: Irving Copas., MD Sent: 10/02/2021  12:28 PM EST To: [name], RN, Yetta Flock, MD Subject: RE: follow up EGD                              SA, Based on my schedule this will likely not be until middle of March at this point already. His blood counts are normal without any evidence of anemia, so I am not sure that the large polyp has to be removed. Since he did not want to undergo RFA, probably reasonable just to do dysplasia surveillance. Suspect you can get that done sooner than I would. If however you are not able to accommodate based on your availability either, let me know and we can work and see what may need to be done. If you are not able to accommodate this since it is unlikely he needs to have that large polyp removed, it may be sooner for you to get it done based on availability. Let [name] and I know and we can work on trying to schedule if need be. Thanks. GM ----- Message ----- From: Yetta Flock, MD Sent: 09/24/2021   2:24 PM EST To: [name], RN, Irving Copas., MD Subject: follow up EGD                                  [name], I saw Ian Rowe in follow up today. He is ready to do a follow up EGD. His Barrett's indefinite for dysplasia is overdue for surveillance but he also has that large gastric polyp that remains. Was wondering given you have worked with him in the past, if you would be willing to do his follow up EGD at the hospital for surveillance of his BE and while doing that can remove his large gastric hyperplastic polyp, assuming that has not  regressed.   [name] can you help schedule if okay with GM? I initially told the patient we would do it in May but read the date wrong and he is actually due for it now, if you can explain it will likely be scheduled in the next few months. Thanks  [name]

## 2021-10-04 NOTE — Telephone Encounter (Signed)
I spoke with Dr. Rush Landmark about Ian Rowe case, where to do EGD and whether or not to remove gastric polyp. His Hgb is normal, no anemia, and the polyp is very likely benign, so thought is to first do his EGD at the [facility], assess the Barrett's and reassess the polyp. If it looks the same or no high risk features we may leave it be given risks for bleeding with removal, etc. If it does need to be removed over time we can do that at the hospital at some point. I called Ian Rowe and he is agreeable to do the EGD at the [facility] with me.   [place] can you contact Ian Rowe and help schedule EGD with me at the [facility]? I have an opening on 1/30 if he is interested in doing it then. Thanks

## 2021-10-08 ENCOUNTER — Telehealth: Payer: Self-pay | Admitting: Gastroenterology

## 2021-10-08 DIAGNOSIS — K317 Polyp of stomach and duodenum: Secondary | ICD-10-CM

## 2021-10-08 DIAGNOSIS — K22710 Barrett's esophagus with low grade dysplasia: Secondary | ICD-10-CM

## 2021-10-08 NOTE — Telephone Encounter (Signed)
Looks like Dr Havery Moros is going to do the EGD in the [facility]  Message from Yetta Flock, MD sent at 10/04/2021 12:38 PM EST ----- Regarding: RE: follow up EGD Okay that sounds reasonable, thanks for the follow up. I spoke with Mr. Creps about it and he is agreeable. Initially he was a bit anxious about the polyp and wanted it out, but I can take a look first at [facility], will take care of it.

## 2021-10-08 NOTE — Telephone Encounter (Signed)
Ian Rowe, he should be scheduled for an EGD in the [facility] with Dr. Havery Moros for gastric polyp. Please let me know the date and I will get his instructions sent to him. Thank you

## 2021-10-08 NOTE — Telephone Encounter (Signed)
Pt has been scheduled. See 10/08/21 telephone note for details.

## 2021-10-08 NOTE — Telephone Encounter (Signed)
Ambulatory referral to GI in epic.  Instructions sent to patient via my chart and mailed today.

## 2021-10-08 NOTE — Telephone Encounter (Signed)
Patient called stating he is still waiting for someone to call him about setting up an EGD.  After looking at the notes, I am not sure whether he was supposed to be scheduled here in our endoscopy center with Dr. Havery Moros or in the hospital with Dr. Rush Landmark.  Let me know if one of you should schedule or if this is something I can schedule for him.  Thank you,.

## 2021-10-08 NOTE — Telephone Encounter (Signed)
Patient has been scheduled for EGD on 11/19/21 at 9:30 a.m.  Thank you.

## 2021-11-18 ENCOUNTER — Encounter: Payer: Self-pay | Admitting: Adult Health

## 2021-11-18 ENCOUNTER — Non-Acute Institutional Stay: Payer: Medicare Other | Admitting: Adult Health

## 2021-11-18 ENCOUNTER — Other Ambulatory Visit: Payer: Self-pay

## 2021-11-18 DIAGNOSIS — K22719 Barrett's esophagus with dysplasia, unspecified: Secondary | ICD-10-CM | POA: Diagnosis not present

## 2021-11-18 DIAGNOSIS — I10 Essential (primary) hypertension: Secondary | ICD-10-CM

## 2021-11-18 DIAGNOSIS — R142 Eructation: Secondary | ICD-10-CM | POA: Diagnosis not present

## 2021-11-18 DIAGNOSIS — K219 Gastro-esophageal reflux disease without esophagitis: Secondary | ICD-10-CM | POA: Diagnosis not present

## 2021-11-18 NOTE — Assessment & Plan Note (Signed)
Excessive per pt ?Following with GI ?

## 2021-11-18 NOTE — Assessment & Plan Note (Signed)
ON PPI therapy followed by GI ?Endo next week  ?

## 2021-11-18 NOTE — Progress Notes (Signed)
? ?[facility] ? ?POS: clinic ? ?Provider:  ?Cindi Carbon, ANP ?[place] ?[phone] ? ? ?Code Status:  ?Goals of Care:  ?Advanced Directives 11/18/2021  ?Does Patient Have a Medical Advance Directive? Yes  ?Type of Advance Directive Healthcare Power of Attorney  ?Does patient want to make changes to medical advance directive? No - Patient declined  ?Copy of [place] in Chart? No - copy requested  ?Pre-existing out of facility DNR order (yellow form or pink MOST form) -  ? ? ? ?Chief Complaint  ?Patient presents with  ? Medical Management of Chronic Issues  ? ? ?HPI: Patient is a 81 y.o. male seen today for an acute visit for bp f/u. ? ?BP was in the 130s with clinic nurse checking. Now 146/74 today.  ? ?Having an endoscopy in one week due to Barretts esophagus and excessive belching. Hx of polyp removal. Takes prilosec.  ? ?Takes B12 for nerve pains, which really helped.  ? ?Feels some stress about his wife that has parkinson's ? ?Past Medical History:  ?Diagnosis Date  ? Allergy   ? seasonal  ? Arthritis   ? knees  ? Barrett's esophagus   ? Cataract   ? BILATERAL REMOVED  ? Gastric polyp   ? GERD (gastroesophageal reflux disease)   ? Glaucoma   ? Hemorrhoids   ? Hyperlipidemia   ? Pneumonia   ? as teenager  ? RBBB   ? RBBB   ? Vasovagal syncope   ? last episode was 2015-16; none since  ? Wears glasses   ? ? ?Past Surgical History:  ?Procedure Laterality Date  ? BIOPSY  01/16/2020  ? Procedure: BIOPSY;  Surgeon: Irving Copas., MD;  Location: [place];  Service: Gastroenterology;;  ? [place]  ? CATARACT EXTRACTION W/ INTRAOCULAR LENS  IMPLANT, BILATERAL  2021  ? COLONOSCOPY  2002; 2007  ? ENDOSCOPIC MUCOSAL RESECTION N/A 01/16/2020  ? Procedure: ENDOSCOPIC MUCOSAL RESECTION;  Surgeon: Rush Landmark Telford Nab., MD;  Location: [place];  Service: Gastroenterology;  Laterality: N/A;  ? ESOPHAGOGASTRODUODENOSCOPY (EGD) WITH PROPOFOL N/A 01/16/2020  ?  Procedure: ESOPHAGOGASTRODUODENOSCOPY (EGD) WITH PROPOFOL;  Surgeon: Rush Landmark Telford Nab., MD;  Location: [place];  Service: Gastroenterology;  Laterality: N/A;  ? HEMOSTASIS CLIP PLACEMENT  01/16/2020  ? Procedure: HEMOSTASIS CLIP PLACEMENT;  Surgeon: Irving Copas., MD;  Location: [place];  Service: Gastroenterology;;  ? HEMOSTASIS CONTROL  01/16/2020  ? Procedure: HEMOSTASIS CONTROL;  Surgeon: Rush Landmark Telford Nab., MD;  Location: [place];  Service: Gastroenterology;;  ? POLYPECTOMY    ? SUBMUCOSAL LIFTING INJECTION  01/16/2020  ? Procedure: SUBMUCOSAL LIFTING INJECTION;  Surgeon: Irving Copas., MD;  Location: [place];  Service: Gastroenterology;;  ? WISDOM TOOTH EXTRACTION    ? ? ?Allergies  ?Allergen Reactions  ? Black Pepper [[place]] Other (See Comments)  ?  bloating  ? Ciprofloxacin Other (See Comments)  ?  Bleeding-blood in stool ?  ? Nsaids   ?  History of bleeding  ? ? ?Outpatient Encounter Medications as of 11/18/2021  ?Medication Sig  ? amLODipine (NORVASC) 2.5 MG tablet Take 1 tablet (2.5 mg total) by mouth daily.  ? b complex vitamins tablet Take 1 tablet by mouth daily.  ? bimatoprost (LUMIGAN) 0.01 % SOLN Place 1 drop into both eyes at bedtime.  ? cetirizine (ZYRTEC) 10 MG tablet Take 10 mg by mouth daily as needed for allergies.   ? Cholecalciferol (VITAMIN D) 2000 UNITS CAPS Take  1 capsule (2,000 Units total) by mouth daily.  ? fluticasone (FLONASE) 50 MCG/ACT nasal spray Place 2 sprays into both nostrils daily as needed for allergies or rhinitis.  ? folic acid (FOLVITE) 629 MCG tablet Take 800 mcg by mouth daily.   ? Glucosamine-Chondroit-Vit C-Mn (GLUCOSAMINE CHONDR 1500 COMPLX PO) Take 2 tablets by mouth daily.   ? omeprazole (PRILOSEC) 40 MG capsule Take 1 capsule (40 mg total) by mouth 2 (two) times daily before a meal.  ? rosuvastatin (CRESTOR) 5 MG tablet Take 1 tablet (5 mg total) by mouth daily.  ? timolol (TIMOPTIC) 0.5 % ophthalmic solution Place 1 drop  into both eyes daily.   ? vitamin B-12 (CYANOCOBALAMIN) 1000 MCG tablet Take 1,000 mcg by mouth daily.  ? ?No facility-administered encounter medications on file as of 11/18/2021.  ? ? ?Review of Systems:  ?Review of Systems  ?Constitutional:  Negative for activity change, appetite change, chills, diaphoresis, fatigue, fever and unexpected weight change.  ?Respiratory:  Negative for cough, shortness of breath, wheezing and stridor.   ?Cardiovascular:  Negative for chest pain, palpitations and leg swelling.  ?Gastrointestinal:  Negative for abdominal distention, abdominal pain, constipation and diarrhea.  ?     Belching  ?Genitourinary:  Negative for difficulty urinating and dysuria.  ?Musculoskeletal:  Negative for arthralgias, back pain, gait problem, joint swelling and myalgias.  ?Neurological:  Negative for dizziness, seizures, syncope, facial asymmetry, speech difficulty, weakness and headaches.  ?Hematological:  Negative for adenopathy. Does not bruise/bleed easily.  ?Psychiatric/Behavioral:  Negative for agitation, behavioral problems and confusion.   ? ?Health Maintenance  ?Topic Date Due  ? COVID-19 Vaccine (5 - Booster for Moderna series) 07/30/2022 (Originally 08/23/2021)  ? TETANUS/TDAP  11/02/2030  ? Pneumonia Vaccine 22+ Years old  Completed  ? INFLUENZA VACCINE  Completed  ? Zoster Vaccines- Shingrix  Completed  ? HPV VACCINES  Aged Out  ? COLONOSCOPY (Pts 45-63yr Insurance coverage will need to be confirmed)  Discontinued  ? ? ?Physical Exam: ?Vitals:  ? 11/18/21 1435  ?BP: (!) 146/74  ?Pulse: 82  ?Temp: 97.7 ?F (36.5 ?C)  ?SpO2: 95%  ?Weight: 174 lb (78.9 kg)  ?Height: '5\' 7"'$  (1.702 m)  ? ?Body mass index is 27.25 kg/m?.Marland Kitchen?Physical Exam ? ?Labs reviewed: ?Basic Metabolic Panel: ?Recent Labs  ?  01/24/21 ?0000 01/24/21 ?1300 08/01/21 ?0000  ?NA  --  140 144  ?K  --  4.0 4.5  ?CL  --  101 103  ?CO2  --  28* 26*  ?BUN  --  11 11  ?CREATININE  --  1.1 1.0  ?CALCIUM 9.4  --  9.3  ?TSH  --  3.49  --   ? ?Liver  Function Tests: ?Recent Labs  ?  01/24/21 ?0000 01/24/21 ?1300  ?AST  --  25  ?ALT  --  19  ?ALKPHOS  --  95  ?ALBUMIN 4.2  --   ? ?No results for input(s): LIPASE, AMYLASE in the last 8760 hours. ?No results for input(s): AMMONIA in the last 8760 hours. ?CBC: ?Recent Labs  ?  01/24/21 ?0000 08/01/21 ?0000  ?WBC 6.1 7.1  ?NEUTROABS 3.80  --   ?HGB 14.6 15.4  ?HCT 42 44  ?PLT 231 238  ? ?Lipid Panel: ?Recent Labs  ?  01/24/21 ?0000  ?CHOL 172  ?HDL 47  ?LDLCALC 77  ?TRIG 243*  ? ?Lab Results  ?Component Value Date  ? HGBA1C 6.1 01/24/2021  ? ? ?Procedures since last visit: ?No results  found. ? ?Assessment/Plan ? ?Barrett's esophagus with dysplasia ?ON PPI therapy followed by GI ?Endo next week  ? ?GERD (gastroesophageal reflux disease) ?Continue prilosec bid 30 min prior to meal ? ?Belching ?Excessive per pt ?Following with GI ? ?Essential hypertension ?Improved ?Norvasc 2.5 mg qd   ? ?Labs/tests ordered:  * No order type specified * CBC CMP A1C Lipid ?Next appt:  4 months  ?Total time 103mn:  time greater than 50% of total time spent doing pt counseling and coordination of care  ? ? ?

## 2021-11-18 NOTE — Assessment & Plan Note (Signed)
Improved ?Norvasc 2.5 mg qd  ?

## 2021-11-18 NOTE — Assessment & Plan Note (Signed)
Continue prilosec bid 30 min prior to meal ?

## 2021-11-19 ENCOUNTER — Encounter: Payer: Medicare Other | Admitting: Gastroenterology

## 2021-11-20 ENCOUNTER — Encounter: Payer: Self-pay | Admitting: Gastroenterology

## 2021-11-25 ENCOUNTER — Encounter: Payer: Self-pay | Admitting: Gastroenterology

## 2021-11-25 ENCOUNTER — Other Ambulatory Visit: Payer: Self-pay

## 2021-11-25 ENCOUNTER — Ambulatory Visit (AMBULATORY_SURGERY_CENTER): Payer: Medicare Other | Admitting: Gastroenterology

## 2021-11-25 ENCOUNTER — Encounter: Payer: Medicare Other | Admitting: Adult Health

## 2021-11-25 VITALS — BP 150/91 | HR 61 | Temp 97.1°F | Resp 17 | Ht 67.0 in | Wt 172.0 lb

## 2021-11-25 DIAGNOSIS — K317 Polyp of stomach and duodenum: Secondary | ICD-10-CM | POA: Diagnosis not present

## 2021-11-25 DIAGNOSIS — K2270 Barrett's esophagus without dysplasia: Secondary | ICD-10-CM

## 2021-11-25 DIAGNOSIS — K22711 Barrett's esophagus with high grade dysplasia: Secondary | ICD-10-CM | POA: Diagnosis not present

## 2021-11-25 MED ORDER — SODIUM CHLORIDE 0.9 % IV SOLN: 500.0000 mL | INTRAVENOUS | Status: DC

## 2021-11-25 NOTE — Progress Notes (Signed)
Pt in recovery with monitors in place, VSS. Report given to receiving RN. Bite guard was placed with pt awake to ensure comfort. No dental or soft tissue damage noted. 

## 2021-11-25 NOTE — Op Note (Signed)
[place] ?Patient Name: Ian Rowe ?Procedure Date: 11/25/2021 9:11 AM ?MRN: [id number] ?Endoscopist: Carlota Raspberry. Havery Moros , MD ?Age: 81 ?Referring MD:  ?Date of Birth: Dec 07, 1940 ?Gender: Male ?Account #: [id number] ?Procedure:                Upper GI endoscopy ?Indications:              Follow-up of Barrett's esophagus, Follow-up of  ?                          gastric polyps - history of short segment  ?                          Barrett's, history of reported low grade dysplasia  ?                          with follow up exam 2021 showing indefinite for  ?                          dysplasia. 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                          - Prior to the procedure, a History and Physical  ?                          was performed, and patient medications and  ?                          allergies were reviewed. The patient's tolerance of  ?                          previous anesthesia was also reviewed. The risks  ?                          and benefits of the procedure and the sedation  ?                          options and risks were discussed with the patient.  ?                          All questions were answered, and informed consent  ?                          was obtained. Prior Anticoagulants: The patient has  ?                          taken no previous anticoagulant or antiplatelet  ?  agents. ASA Grade Assessment: III - A patient with  ?                          severe systemic disease. After reviewing the risks  ?                          and benefits, the patient was deemed in  ?                          satisfactory condition to undergo the procedure. ?                          After obtaining informed consent, the endoscope was  ?                          passed under direct  vision. Throughout the  ?                          procedure, the patient's blood pressure, pulse, and  ?                          oxygen saturations were monitored continuously. The  ?                          Endoscope was introduced through the mouth, and  ?                          advanced to the second part of duodenum. The upper  ?                          GI endoscopy was accomplished without difficulty.  ?                          The patient tolerated the procedure well. ?Scope In: ?Scope Out: ?Findings:                 Esophagogastric landmarks were identified: the  ?                          Z-line was found at 32 cm, the gastroesophageal  ?                          junction was found at 34 cm and the upper extent of  ?                          the gastric folds was found at 36 cm from the  ?                          incisors. ?                          A 2-3cm sliding hiatal hernia was present. ?  Barrett's esophagus was present in the distal  ?                          esophagus. The maximum longitudinal extent of these  ?                          mucosal changes was 2 cm in length. The z line was  ?                          irregular with multiple short tongues / islands of  ?                          salmon colored mucosa. One focal area at 33cm from  ?                          the incisors (circled on pictures) had some  ?                          suspected mild nodularity. This was biopsied and  ?                          placed in its own jar and other biopsies taken from  ?                          the rest of the Barrett's segment with cold forceps. ?                          The exam of the esophagus was otherwise normal. ?                          Multiple sessile polyps were found in the gastric  ?                          fundus and in the gastric body consistent with  ?                          known hyperplastic polyps. A large pedunculated  ?                           polyp noted in the fundus / cardia - not removed,  ?                          stable from prior exams. There was an old  ?                          hemostasis clip remaining in place at site of prior  ?                          polypectomy. ?                          The exam of the stomach was otherwise normal. ?  The duodenal bulb and second portion of the  ?                          duodenum were normal. There was a prominent fold at  ?                          the pylorus which extended into the duodenal bulb. ?Complications:            No immediate complications. Estimated blood loss:  ?                          Minimal. ?Estimated Blood Loss:     Estimated blood loss was minimal. ?Impression:               - Esophagogastric landmarks identified. ?                          - 2-3cm cm hiatal hernia. ?                          - Barrett's esophagus described as above. One focal  ?                          area at 33cm with subtle nodularity. Biopsied. ?                          - Multiple gastric polyps. Stable in appearance  ?                          compared to last exam in 2021. Hyperplastic /  ?                          benign appearing ?                          - Normal stomach otherwise ?                          - Normal duodenal bulb and second portion of the  ?                          duodenum. ?Recommendation:           - Patient has a contact number available for  ?                          emergencies. The signs and symptoms of potential  ?                          delayed complications were discussed with the  ?                          patient. Return to normal activities tomorrow.  ?                          Written discharge instructions were provided to the  ?  patient. ?                          - Resume previous diet. ?                          - Continue present medications. ?                          - Await pathology results with further   ?                          recommendations regarding Barrett's management ?                          - Large dominant gastric polyp is likely  ?                          hyperplastic / benign, but high risk for removal  ?                          with risks for bleeding. Will discuss with Dr.  Marland Kitchen                          Mansouraty who has performed prior polypectomies.  ?                          No anemia currently. ?Carlota Raspberry. Gayleen Sholtz, MD ?11/25/2021 9:50:10 AM ?This report has been signed electronically. ?

## 2021-11-25 NOTE — Progress Notes (Signed)
Called to room to assist during endoscopic procedure.  Patient ID and intended procedure confirmed with present staff. Received instructions for my participation in the procedure from the performing physician.  

## 2021-11-25 NOTE — Progress Notes (Signed)
[facility] History and Physical ? ? ?Primary Care Physician:  Virgie Dad, MD ? ? ?Reason for Procedure:   Barrett's esophagus, gastric polyps ? ?Plan:    EGD ? ? ? ? ?HPI: Ian Rowe is a 81 y.o. male  here for EGD surveillance of Barrett's and gastric polyps. Last exam 11/2019 - short segment BE with changes indefinite for dysplasia and reported low grade dysplasia on exam prior to that. Also with history of large hyperplastic gastric polyps. Otherwise feels well without any cardiopulmonary symptoms. On omeprazole BID ? ? ?Past Medical History:  ?Diagnosis Date  ? Allergy   ? seasonal  ? Arthritis   ? knees  ? Barrett's esophagus   ? Cataract   ? BILATERAL REMOVED  ? Gastric polyp   ? GERD (gastroesophageal reflux disease)   ? Glaucoma   ? Hemorrhoids   ? Hyperlipidemia   ? Hypertension   ? Pneumonia   ? as teenager  ? RBBB   ? RBBB   ? Vasovagal syncope   ? last episode was 2015-16; none since  ? Wears glasses   ? ? ?Past Surgical History:  ?Procedure Laterality Date  ? BIOPSY  01/16/2020  ? Procedure: BIOPSY;  Surgeon: Irving Copas., MD;  Location: [place];  Service: Gastroenterology;;  ? [place]  ? CATARACT EXTRACTION W/ INTRAOCULAR LENS  IMPLANT, BILATERAL  2021  ? COLONOSCOPY  2002; 2007  ? ENDOSCOPIC MUCOSAL RESECTION N/A 01/16/2020  ? Procedure: ENDOSCOPIC MUCOSAL RESECTION;  Surgeon: Rush Landmark Telford Nab., MD;  Location: [place];  Service: Gastroenterology;  Laterality: N/A;  ? ESOPHAGOGASTRODUODENOSCOPY (EGD) WITH PROPOFOL N/A 01/16/2020  ? Procedure: ESOPHAGOGASTRODUODENOSCOPY (EGD) WITH PROPOFOL;  Surgeon: Rush Landmark Telford Nab., MD;  Location: [place];  Service: Gastroenterology;  Laterality: N/A;  ? HEMOSTASIS CLIP PLACEMENT  01/16/2020  ? Procedure: HEMOSTASIS CLIP PLACEMENT;  Surgeon: Irving Copas., MD;  Location: [place];  Service: Gastroenterology;;  ? HEMOSTASIS CONTROL  01/16/2020  ? Procedure: HEMOSTASIS CONTROL;   Surgeon: Rush Landmark Telford Nab., MD;  Location: [place];  Service: Gastroenterology;;  ? POLYPECTOMY    ? SUBMUCOSAL LIFTING INJECTION  01/16/2020  ? Procedure: SUBMUCOSAL LIFTING INJECTION;  Surgeon: Irving Copas., MD;  Location: [place];  Service: Gastroenterology;;  ? WISDOM TOOTH EXTRACTION    ? ? ?Prior to Admission medications   ?Medication Sig Start Date End Date Taking? Authorizing Provider  ?amLODipine (NORVASC) 2.5 MG tablet Take 1 tablet (2.5 mg total) by mouth daily. 08/05/21  Yes Virgie Dad, MD  ?b complex vitamins tablet Take 1 tablet by mouth daily.   Yes [provider]  ?bimatoprost (LUMIGAN) 0.01 % SOLN Place 1 drop into both eyes at bedtime.   Yes [provider]  ?cetirizine (ZYRTEC) 10 MG tablet Take 10 mg by mouth daily as needed for allergies.    Yes [provider]  ?Cholecalciferol (VITAMIN D) 2000 UNITS CAPS Take 1 capsule (2,000 Units total) by mouth daily. 01/30/15  Yes Reed, Tiffany L, DO  ?fluticasone (FLONASE) 50 MCG/ACT nasal spray Place 2 sprays into both nostrils daily as needed for allergies or rhinitis.   Yes [provider]  ?folic acid (FOLVITE) 174 MCG tablet Take 800 mcg by mouth daily.    Yes [provider]  ?Glucosamine-Chondroit-Vit C-Mn (GLUCOSAMINE CHONDR 1500 COMPLX PO) Take 2 tablets by mouth daily.    Yes [provider]  ?omeprazole (PRILOSEC) 40 MG capsule Take 1 capsule (40 mg total) by mouth  2 (two) times daily before a meal. 04/11/21  Yes Virgie Dad, MD  ?rosuvastatin (CRESTOR) 5 MG tablet Take 1 tablet (5 mg total) by mouth daily. 05/07/21  Yes Virgie Dad, MD  ?timolol (TIMOPTIC) 0.5 % ophthalmic solution Place 1 drop into both eyes daily.  06/20/15  Yes [provider]  ?vitamin B-12 (CYANOCOBALAMIN) 1000 MCG tablet Take 1,000 mcg by mouth daily.   Yes [provider]  ? ? ?Current Outpatient Medications  ?Medication Sig Dispense Refill  ? amLODipine (NORVASC)  2.5 MG tablet Take 1 tablet (2.5 mg total) by mouth daily. 60 tablet 3  ? b complex vitamins tablet Take 1 tablet by mouth daily.    ? bimatoprost (LUMIGAN) 0.01 % SOLN Place 1 drop into both eyes at bedtime.    ? cetirizine (ZYRTEC) 10 MG tablet Take 10 mg by mouth daily as needed for allergies.     ? Cholecalciferol (VITAMIN D) 2000 UNITS CAPS Take 1 capsule (2,000 Units total) by mouth daily. 30 capsule 3  ? fluticasone (FLONASE) 50 MCG/ACT nasal spray Place 2 sprays into both nostrils daily as needed for allergies or rhinitis.    ? folic acid (FOLVITE) 856 MCG tablet Take 800 mcg by mouth daily.     ? Glucosamine-Chondroit-Vit C-Mn (GLUCOSAMINE CHONDR 1500 COMPLX PO) Take 2 tablets by mouth daily.     ? omeprazole (PRILOSEC) 40 MG capsule Take 1 capsule (40 mg total) by mouth 2 (two) times daily before a meal. 180 capsule 3  ? rosuvastatin (CRESTOR) 5 MG tablet Take 1 tablet (5 mg total) by mouth daily. 90 tablet 2  ? timolol (TIMOPTIC) 0.5 % ophthalmic solution Place 1 drop into both eyes daily.     ? vitamin B-12 (CYANOCOBALAMIN) 1000 MCG tablet Take 1,000 mcg by mouth daily.    ? ?Current Facility-Administered Medications  ?Medication Dose Route Frequency Provider Last Rate Last Admin  ? 0.9 %  sodium chloride infusion  500 mL Intravenous Continuous Merri Dimaano, Carlota Raspberry, MD      ? ? ?Allergies as of 11/25/2021 - Review Complete 11/25/2021  ?Allergen Reaction Noted  ? Black pepper [[place]] Other (See Comments) 12/08/2013  ? Ciprofloxacin Other (See Comments) 11/17/2019  ? Nsaids  04/01/2013  ? ? ?Family History  ?Problem Relation Age of Onset  ? Alzheimer's disease Mother   ? Heart disease Father   ? Colon cancer Neg Hx   ? Colon polyps Neg Hx   ? Esophageal cancer Neg Hx   ? Stomach cancer Neg Hx   ? Rectal cancer Neg Hx   ? Inflammatory bowel disease Neg Hx   ? Liver disease Neg Hx   ? Pancreatic cancer Neg Hx   ? ? ?Social History  ? ?Socioeconomic History  ? Marital status: Married  ?  Spouse name: Mardene Celeste   ? Number of children: 2  ? Years of education: Not on file  ? Highest education level: Not on file  ?Occupational History  ? Occupation: Retired Clinical biochemist  ?Tobacco Use  ? Smoking status: Never  ? Smokeless tobacco: Never  ?Vaping Use  ? Vaping Use: Never used  ?Substance and Sexual Activity  ? Alcohol use: Yes  ?  Alcohol/week: 10.0 standard drinks  ?  Types: 7 Shots of liquor, 3 Glasses of wine per week  ?  Comment: 2 scotch at bedtime nightly, occ glass of wine   ? Drug use: No  ? Sexual activity: Not on file  ?Other Topics  Concern  ? Not on file  ?Social History Narrative  ? Moved to [facility] 2014.  ? Married  ? Never smoked  ? Alcohol - scotch or wine nightly  ? Walks 150 minutes per week.   ? POA  ? ?Social Determinants of Health  ? ?Financial Resource Strain: Not on file  ?Food Insecurity: Not on file  ?Transportation Needs: Not on file  ?Physical Activity: Not on file  ?Stress: Not on file  ?Social Connections: Not on file  ?Intimate Partner Violence: Not on file  ? ? ?Review of Systems: ?All other review of systems negative except as mentioned in the HPI. ? ?Physical Exam: ?Vital signs ?BP (!) 157/88   Pulse 77   Temp (!) 97.1 ?F (36.2 ?C)   Ht '5\' 7"'$  (1.702 m)   Wt 172 lb (78 kg)   SpO2 96%   BMI 26.94 kg/m?  ? ?General:   Alert,  Well-developed, pleasant and cooperative in NAD ?Lungs:  Clear throughout to auscultation.   ?Heart:  Regular rate and rhythm ?Abdomen:  Soft, nontender and nondistended.   ?Neuro/Psych:  Alert and cooperative. Normal mood and affect. A and O x 3 ? ?Jolly Mango, MD ?[facility] ? ? ?

## 2021-11-25 NOTE — Patient Instructions (Signed)
Read all of the handouts given to you by your recovery room nurse. ? ?YOU HAD AN ENDOSCOPIC PROCEDURE TODAY AT [facility]:   Refer to the procedure report that was given to you for any specific questions about what was found during the examination.  If the procedure report does not answer your questions, please call your gastroenterologist to clarify.  If you requested that your care partner not be given the details of your procedure findings, then the procedure report has been included in a sealed envelope for you to review at your convenience later. ? ?YOU SHOULD EXPECT: Some feelings of bloating in the abdomen. Passage of more gas than usual.  Walking can help get rid of the air that was put into your GI tract during the procedure and reduce the bloating.  ? ?Please Note:  You might notice some irritation and congestion in your nose or some drainage.  This is from the oxygen used during your procedure.  There is no need for concern and it should clear up in a day or so. ? ?SYMPTOMS TO REPORT IMMEDIATELY: ? ?Following upper endoscopy (EGD) ? Vomiting of blood or coffee ground material ? New chest pain or pain under the shoulder blades ? Painful or persistently difficult swallowing ? New shortness of breath ? Fever of 100?F or higher ? Black, tarry-looking stools ? ?For urgent or emergent issues, a gastroenterologist can be reached at any hour by calling [phone]. ?Do not use MyChart messaging for urgent concerns.  ? ? ?DIET:  We do recommend a small meal at first, but then you may proceed to your regular diet.  Drink plenty of fluids but you should avoid alcoholic beverages for 24 hours. ?Try to eat a low acid diet. ? ?ACTIVITY:  You should plan to take it easy for the rest of today and you should NOT DRIVE or use heavy machinery until tomorrow (because of the sedation medicines used during the test).   ? ?FOLLOW UP: ?Our staff will call the number listed on your records 48-72 hours  following your procedure to check on you and address any questions or concerns that you may have regarding the information given to you following your procedure. If we do not reach you, we will leave a message.  We will attempt to reach you two times.  During this call, we will ask if you have developed any symptoms of COVID 19. If you develop any symptoms (ie: fever, flu-like symptoms, shortness of breath, cough etc.) before then, please call [phone].  If you test positive for Covid 19 in the 2 weeks post procedure, please call and report this information to [place].   ? ?If any biopsies were taken you will be contacted by phone or by letter within the next 1-3 weeks.  Please call us at [phone] if you have not heard about the biopsies in 3 weeks.  ? ? ?SIGNATURES/CONFIDENTIALITY: ?You and/or your care partner have signed paperwork which will be entered into your electronic medical record.  These signatures attest to the fact that that the information above on your After Visit Summary has been reviewed and is understood.  Full responsibility of the confidentiality of this discharge information lies with you and/or your care-partner.  ?

## 2021-11-27 ENCOUNTER — Telehealth: Payer: Self-pay | Admitting: *Deleted

## 2021-11-27 NOTE — Telephone Encounter (Signed)
?  Follow up Call- ? ?Call back number 11/25/2021 11/23/2019 05/20/2019  ?Post procedure Call Back phone  # [phone]  ?Permission to leave phone message Yes Yes Yes  ?Some recent data might be hidden  ?  ? ?Patient questions: ? ?Do you have a fever, pain , or abdominal swelling? No. ?Pain Score  0 * ? ?Have you tolerated food without any problems? Yes.   ? ?Have you been able to return to your normal activities? Yes.   ? ?Do you have any questions about your discharge instructions: ?Diet   No. ?Medications  No. ?Follow up visit  No. ? ?Do you have questions or concerns about your Care? No. ? ?Actions: ?* If pain score is 4 or above: ?No action needed, pain <4. ? ? ?

## 2021-11-27 NOTE — Telephone Encounter (Signed)
Attempted to call patient for their post-procedure follow-up call. No answer. Left voicemail.   

## 2021-12-02 ENCOUNTER — Other Ambulatory Visit: Payer: Self-pay

## 2021-12-02 ENCOUNTER — Encounter: Payer: Self-pay | Admitting: Internal Medicine

## 2021-12-02 DIAGNOSIS — K2270 Barrett's esophagus without dysplasia: Secondary | ICD-10-CM

## 2021-12-02 MED ORDER — SUCRALFATE 1 GM/10ML PO SUSP: 1.0000 g | Freq: Two times a day (BID) | ORAL | 0 refills | Status: DC

## 2021-12-13 ENCOUNTER — Encounter: Payer: Self-pay | Admitting: Gastroenterology

## 2021-12-13 ENCOUNTER — Other Ambulatory Visit: Payer: Self-pay

## 2021-12-13 MED ORDER — SUCRALFATE 1 GM/10ML PO SUSP: 1.0000 g | Freq: Two times a day (BID) | ORAL | 0 refills | Status: DC

## 2021-12-13 NOTE — Progress Notes (Signed)
Refill for carafate sent to pharmacy ? ?

## 2022-01-07 ENCOUNTER — Other Ambulatory Visit (INDEPENDENT_AMBULATORY_CARE_PROVIDER_SITE_OTHER): Payer: Medicare Other

## 2022-01-07 ENCOUNTER — Encounter: Payer: Self-pay | Admitting: Gastroenterology

## 2022-01-07 ENCOUNTER — Ambulatory Visit: Payer: Medicare Other | Admitting: Gastroenterology

## 2022-01-07 ENCOUNTER — Encounter: Payer: Self-pay | Admitting: Internal Medicine

## 2022-01-07 VITALS — BP 126/80 | HR 82 | Ht 67.0 in | Wt 172.0 lb

## 2022-01-07 DIAGNOSIS — R142 Eructation: Secondary | ICD-10-CM

## 2022-01-07 DIAGNOSIS — K317 Polyp of stomach and duodenum: Secondary | ICD-10-CM | POA: Diagnosis not present

## 2022-01-07 DIAGNOSIS — Z8639 Personal history of other endocrine, nutritional and metabolic disease: Secondary | ICD-10-CM

## 2022-01-07 DIAGNOSIS — K22711 Barrett's esophagus with high grade dysplasia: Secondary | ICD-10-CM

## 2022-01-07 DIAGNOSIS — R933 Abnormal findings on diagnostic imaging of other parts of digestive tract: Secondary | ICD-10-CM | POA: Diagnosis not present

## 2022-01-07 LAB — IBC + FERRITIN
Ferritin: 30.1 ng/mL (ref 22.0–322.0)
Iron: 71 ug/dL (ref 42–165)
Saturation Ratios: 16.7 % — ABNORMAL LOW (ref 20.0–50.0)
TIBC: 424.2 ug/dL (ref 250.0–450.0)
Transferrin: 303 mg/dL (ref 212.0–360.0)

## 2022-01-07 LAB — CBC
HCT: 44.5 % (ref 39.0–52.0)
Hemoglobin: 15.3 g/dL (ref 13.0–17.0)
MCHC: 34.4 g/dL (ref 30.0–36.0)
MCV: 92.1 fl (ref 78.0–100.0)
Platelets: 228 10*3/uL (ref 150.0–400.0)
RBC: 4.83 Mil/uL (ref 4.22–5.81)
RDW: 13.4 % (ref 11.5–15.5)
WBC: 7.6 10*3/uL (ref 4.0–10.5)

## 2022-01-07 NOTE — Patient Instructions (Signed)
Your provider has requested that you go to the basement level for lab work before leaving today. Press "B" on the elevator. The lab is located at the first door on the left as you exit the elevator.  You have been scheduled for an endoscopy. Please follow written instructions given to you at your visit today. If you use inhalers (even only as needed), please bring them with you on the day of your procedure.  Thank you for choosing me and [facility].  Dr. Mansouraty  

## 2022-01-11 ENCOUNTER — Encounter: Payer: Self-pay | Admitting: Gastroenterology

## 2022-01-11 DIAGNOSIS — K22711 Barrett's esophagus with high grade dysplasia: Secondary | ICD-10-CM | POA: Insufficient documentation

## 2022-01-11 DIAGNOSIS — Z8639 Personal history of other endocrine, nutritional and metabolic disease: Secondary | ICD-10-CM | POA: Insufficient documentation

## 2022-01-11 NOTE — H&P (View-Only) (Signed)
 GASTROENTEROLOGY OUTPATIENT CLINIC VISIT   Primary Care Provider Gupta, Anjali L, MD [address] [place] [address] [phone]  Referring Provider Dr. Armbruster  Patient Profile: Ian Rowe is a 80 y.o. male with a pmh significant for allergies, osteoarthritis, hyperlipidemia, gout, GERD, Barrett's esophagus (now progressed to high-grade dysplasia and nodularity on recent biopsies), hyperplastic gastric polyps, colon polyps, hemorrhoids.  The patient presents to the [facility] for an evaluation and management of problem(s) noted below:  Problem List 1. Barrett's esophagus with high grade dysplasia   2. Belching   3. Gastric polyps   4. History of iron deficiency   5. Abnormal endoscopy of upper gastrointestinal tract     History of Present Illness Please see prior GI notes for full details of HPI.  Interval History I had last seen the patient in 2022 after his endoscopy where we had removed multiple gastric polyps improving his iron deficiency.  I offered him at that time of follow-up with his primary gastroenterologist to undergo repeat endoscopy in 2022 for follow-up of indefinite dysplasia.  He did not have that procedure until this year 2023 by Dr. Armbruster.  He was found on that endoscopy Results as below, though there were already findings now of potential nodularity with high-grade dysplasia within the Barrett's esophagus and he continued to have large hyperplastic gastric polyps.  It is for this reason that the patient is sent back to me to consider endoscopic resection, Barrett's ablation, and further endoscopic polyp resections if possible.  Patient continues to do well overall.  His main symptom continues to be his belching at the end of the day which remains symptomatic for him but is not impeding his activities of daily living though he wishes that there was some way of finding out what is causing this.  He does not feel weak and does  not have any evidence of anemia based on his labs that are from last year.  He denies any melena.  GI Review of Systems Positive as above including periods of distention of the abdomen Negative for odynophagia, dysphagia, nausea, vomiting, pain, change in bowel habits, hematochezia  Review of Systems General: Denies fevers/chills/weight loss unintentionally Cardiovascular: Denies chest pain Pulmonary: Denies shortness of breath Gastroenterological: See HPI Genitourinary: Denies darkened urine Hematological: Denies easy bruising/bleeding Dermatological: Denies jaundice Psychological: Mood is stable   Medications Current Outpatient Medications  Medication Sig Dispense Refill   amLODipine (NORVASC) 2.5 MG tablet Take 1 tablet (2.5 mg total) by mouth daily. 60 tablet 3   b complex vitamins tablet Take 1 tablet by mouth daily.     bimatoprost (LUMIGAN) 0.01 % SOLN Place 1 drop into both eyes at bedtime.     cetirizine (ZYRTEC) 10 MG tablet Take 10 mg by mouth daily as needed for allergies.      Cholecalciferol (VITAMIN D) 2000 UNITS CAPS Take 1 capsule (2,000 Units total) by mouth daily. 30 capsule 3   fluticasone (FLONASE) 50 MCG/ACT nasal spray Place 2 sprays into both nostrils daily as needed for allergies or rhinitis.     folic acid (FOLVITE) 800 MCG tablet Take 800 mcg by mouth daily.      Glucosamine-Chondroit-Vit C-Mn (GLUCOSAMINE CHONDR 1500 COMPLX PO) Take 2 tablets by mouth daily.      omeprazole (PRILOSEC) 40 MG capsule Take 1 capsule (40 mg total) by mouth 2 (two) times daily before a meal. 180 capsule 3   rosuvastatin (CRESTOR) 5 MG tablet Take 1 tablet (5 mg   total) by mouth daily. 90 tablet 2   sucralfate (CARAFATE) 1 GM/10ML suspension Take 10 mLs (1 g total) by mouth every 12 (twelve) hours. 600 mL 0   timolol (TIMOPTIC) 0.5 % ophthalmic solution Place 1 drop into both eyes daily.      vitamin B-12 (CYANOCOBALAMIN) 1000 MCG tablet Take 1,000 mcg by mouth daily.      Current Facility-Administered Medications  Medication Dose Route Frequency Provider Last Rate Last Admin   0.9 %  sodium chloride infusion  500 mL Intravenous Continuous Armbruster, Steven P, MD        Allergies Allergies  Allergen Reactions   Black Pepper [[place]] Other (See Comments)    bloating   Ciprofloxacin Other (See Comments)    Bleeding-blood in stool    Nsaids     History of bleeding    Histories Past Medical History:  Diagnosis Date   Allergy    seasonal   Arthritis    knees   Barrett's esophagus    Cataract    BILATERAL REMOVED   Gastric polyp    GERD (gastroesophageal reflux disease)    Glaucoma    Hemorrhoids    Hyperlipidemia    Hypertension    Pneumonia    as teenager   RBBB    RBBB    Vasovagal syncope    last episode was 2015-16; none since   Wears glasses    Past Surgical History:  Procedure Laterality Date   BIOPSY  01/16/2020   Procedure: BIOPSY;  Surgeon: Mansouraty, Recie Cirrincione Jr., MD;  Location: [facility];  Service: Gastroenterology;;   CARDIAC CATHETERIZATION  1993   CATARACT EXTRACTION W/ INTRAOCULAR LENS  IMPLANT, BILATERAL  2021   COLONOSCOPY  2002; 2007   ENDOSCOPIC MUCOSAL RESECTION N/A 01/16/2020   Procedure: ENDOSCOPIC MUCOSAL RESECTION;  Surgeon: Mansouraty, Davion Meara Jr., MD;  Location: [facility];  Service: Gastroenterology;  Laterality: N/A;   ESOPHAGOGASTRODUODENOSCOPY (EGD) WITH PROPOFOL N/A 01/16/2020   Procedure: ESOPHAGOGASTRODUODENOSCOPY (EGD) WITH PROPOFOL;  Surgeon: Mansouraty, Itzae Miralles Jr., MD;  Location: [facility];  Service: Gastroenterology;  Laterality: N/A;   HEMOSTASIS CLIP PLACEMENT  01/16/2020   Procedure: HEMOSTASIS CLIP PLACEMENT;  Surgeon: Mansouraty, Suha Schoenbeck Jr., MD;  Location: [facility];  Service: Gastroenterology;;   HEMOSTASIS CONTROL  01/16/2020   Procedure: HEMOSTASIS CONTROL;  Surgeon: Mansouraty, Zalmen Wrightsman Jr., MD;  Location: [facility];  Service: Gastroenterology;;   POLYPECTOMY     SUBMUCOSAL  LIFTING INJECTION  01/16/2020   Procedure: SUBMUCOSAL LIFTING INJECTION;  Surgeon: Mansouraty, Kalyna Paolella Jr., MD;  Location: [facility];  Service: Gastroenterology;;   WISDOM TOOTH EXTRACTION     Social History   Socioeconomic History   Marital status: Married    Spouse name: Ian Rowe   Number of children: 2   Years of education: Not on file   Highest education level: Not on file  Occupational History   Occupation: Retired theater director/playwright/professor  Tobacco Use   Smoking status: Never   Smokeless tobacco: Never  Vaping Use   Vaping Use: Never used  Substance and Sexual Activity   Alcohol use: Yes    Alcohol/week: 10.0 standard drinks    Types: 7 Shots of liquor, 3 Glasses of wine per week    Comment: 2 scotch at bedtime nightly, occ glass of wine    Drug use: No   Sexual activity: Not on file  Other Topics Concern   Not on file  Social History Narrative   Moved to [place] 2014.   Married   Never   smoked   Alcohol - scotch or wine nightly   Walks 150 minutes per week.    POA   Social Determinants of Health   Financial Resource Strain: Not on file  Food Insecurity: Not on file  Transportation Needs: Not on file  Physical Activity: Not on file  Stress: Not on file  Social Connections: Not on file  Intimate Partner Violence: Not on file   Family History  Problem Relation Age of Onset   Alzheimer's disease Mother    Heart disease Father    Colon cancer Neg Hx    Colon polyps Neg Hx    Esophageal cancer Neg Hx    Stomach cancer Neg Hx    Rectal cancer Neg Hx    Inflammatory bowel disease Neg Hx    Liver disease Neg Hx    Pancreatic cancer Neg Hx    I have reviewed his medical, social, and family history in detail and updated the electronic medical record as necessary.    PHYSICAL EXAMINATION  BP 126/80   Pulse 82   Ht 5' 7" (1.702 m)   Wt 172 lb (78 kg)   BMI 26.94 kg/m  GEN: NAD, appears stated age, doesn't appear chronically ill PSYCH:  Cooperative, without pressured speech EYE: Conjunctivae pink, sclerae anicteric ENT: MMM CV: Nontachycardic RESP: No audible wheezing GI: NABS, soft, NT/ND, without rebound MSK/EXT: No lower extremity edema SKIN: No jaundice NEURO:  Alert & Oriented x 3, no focal deficits   REVIEW OF DATA  I reviewed the following data at the time of this encounter:  GI Procedures and Studies  March 2023 EGD - Esophagogastric landmarks identified. - 2-3 cm hiatal hernia. - Barrett's esophagus described as above. One focal area at 33 cm with subtle nodularity.  Biopsied. - Multiple gastric polyps. Stable in appearance compared to last exam in 2021. Hyperplastic / benign appearing - Normal stomach otherwise - Normal duodenal bulb and second portion of the duodenum.  Pathology Diagnosis 1. Surgical [P], distal esophagus (34 and 33 cm) - BARRETTS ESOPHAGUS, NEGATIVE FOR DYSPLASIA 2. Surgical [P], distal esophagus nodularity (33 cm) - HIGH-GRADE DYSPLASIA ARISING IN BACKGROUND OF BARRETT'S ESOPHAGUS. SEE NOTE 3. Surgical [P], distal esophagus (32 cm) - BARRETT ESOPHAGUS, NEGATIVE FOR DYSPLASIA  May 2021 EGD was re-reviewed - No gross lesions in esophagus proximally. - Esophageal mucosal changes consistent with short-segment Barrett's esophagus. Biopsied. - Medium-sized hiatal hernia. - Three pedunculated gastric polyps. Resected and retrieved. Clips (MR conditional) were placed. - Four semi-sessile gastric polyps. Resected and retrieved. Clips (MR conditional) were placed. - A single large pedunculated gastric polyp. Resection not attempted - query Polyloop in future. - Multiple gastric polyps noted but not removed. - No gross lesions in the duodenal bulb, in the first portion of the duodenum and in the second portion of the duodenum. Pathology FINAL MICROSCOPIC DIAGNOSIS:  A. STOMACH, POLYPECTOMY:  - Gastric hyperplastic polyp(s) with ulceration  - Negative for intestinal metaplasia,  dysplasia or malignancy  B. ESOPHAGUS, AT 34CM, BIOPSY:  - Barretts esophagus, negative for dysplasia  C. ESOPHAGUS, AT 33CM, BIOPSY:  - Barretts esophagus, negative for dysplasia  D. ESOPHAGUS, AT 32CM, BIOPSY:  - Barrett's esophagus, focally indefinite for dysplasia.  See comment  COMMENT:  D.  The Barrett's mucosa shows a few isolated glands with enlarged and hyperchromatic nuclei, worrisome for high-grade dysplasia.  Immunohistochemical stain for p53 shows a single gland with clonal-like overexpression pattern.  But this focus is very small and maturation towards the   surface cannot be evaluated due to stripping of the surface mucosa.  Hence, the changes are interpreted as indefinite for dysplasia.  Laboratory Studies  Reviewed that in epic  Imaging Studies  No relevant imaging to review   ASSESSMENT  Mr. Bultman is a 81 y.o. male with a pmh significant for allergies, osteoarthritis, hyperlipidemia, gout, GERD, Barrett's esophagus (now progressed to high-grade dysplasia and nodularity on recent biopsies), hyperplastic gastric polyps, colon polyps, hemorrhoids.  The patient is seen today for evaluation and management of:  1. Barrett's esophagus with high grade dysplasia   2. Belching   3. Gastric polyps   4. History of iron deficiency   5. Abnormal endoscopy of upper gastrointestinal tract    The patient is hemodynamically stable.  Clinically his major symptom of belching, remains unaccounted for but it is stable.  Unfortunately, it looks like the patient has had progression of his Barrett's esophagus to high-grade dysplasia, and this is found in an area of potential erosion/nodularity.  I had another extensive discussion with him about the risks of high-grade dysplasia and progression to esophageal cancer.  Study suggests that the risk of patient to have high-grade dysplasia can be progression to adenocarcinoma by 3 to 6 %/year.  With the finding of this area of potential nodularity  and high-grade dysplasia, we must be prepared for endoscopic mucosal resection.  Based upon the description and endoscopic pictures I do feel that it is reasonable to pursue an Advanced Polypectomy attempt of the polyp/lesion.  We discussed some of the techniques of band ligation/multi band ligation.  The risks and benefits of endoscopic evaluation were discussed with the patient; these include but are not limited to the risk of perforation, infection, bleeding, missed lesions, lack of diagnosis, severe illness requiring hospitalization, as well as anesthesia and sedation related illnesses.  During attempts at advanced resection, the risks of bleeding and perforation/leak are increased as opposed to diagnostic and screening procedures, and that was discussed with the patient as well.   In addition, I explained that with the possible need for piecemeal resection, subsequent short-interval endoscopic evaluation for follow up and potential retreatment of the lesion/area may be necessary.  If, after attempt at removal of the polyp/lesion, it is found that the patient has a complication or that an invasive lesion or malignant lesion is found, or that the polyp/lesion continues to recur, the patient is aware and understands that surgery may still be indicated/required.    We discussed that the length of the intestinal metaplasia is thought to remain static, however, the histopathology of the BE to cancer sequence is believed to progress along a continuum: non-dysplastic to low grade dysplasia (LGD) to high grade dysplasia (HGD) to intramucosal/early mucosal carcinoma [facility]) to advanced invasive carcinoma (AIC).  It is important to recognize that HGD and EMC may be indistinguishable histologically and the two often coexist.  It is also important to recognize that LGD and HGD may never progress to cancer and may even regress in certain circumstances.  We discussed the options for management which include but endoluminal  therapies including endoscopic mucosal resection, radiofrequency ablation and cryotherapy.  We discussed the risks and benefits of each.  Treatment is always very individualized.  Age, co-morbidities, aversion to cancer risk, local surgical and endoscopic expertise in the area, are all considered when making a plan of care.  If there is no evidence of invasion, then the patient's endoscopic treatment options include radiofrequency ablation and/or endoscopic mucosal resection and/or cryotherapy.  I informed the patient that endoscopic therapy typically requires an average of 2-4 treatment sessions though frequently more sessions are required for complete eradication of all intestinal metaplasia.  These sessions will be schedule every 3-4 months until all areas are effectively treated.  The goal of endoscopic therapy is complete eradication of dysplasia and intestinal metaplasia.  It is known that approximately 1 out of every 10 patients may not respond completely to endoscopic ablation therapy and all Barrett's tissue may not be able to be fully eradicated.  I reinforced that the patient must accept life-long endoscopic surveillance and acid suppression therapy as recurrence rates of BE may be as high as 30%.  Surveillance of previously treated BE involves repeating the endoscopy every 6 months for the first year following complete eradication followed by endoscopy every 12 thereafter if no recurrence is found.   Although every effort will be made to treat all of the Barrett's esophagus endoscopically, I reinforced that endoscopic eradication therapy does not prevent the possible need for surgery down the line or other therapies to be considered.  Furthermore, endoscopic therapy does not completely eliminate the risk of developing esophageal cancer, it does decrease risk though.  I also discussed the potential complications of endoluminal therapy which includes, but is not limited to: perforation, bleeding, and  stricture formation.  I also reinforced that it is common to experience pain following radiofrequency ablation and this would be managed appropriately with pain medications post-procedure. All patient questions were answered, to the best of my ability, and the patient agrees to the aforementioned plan of action with follow-up as indicated.  In regards to his previously removed hyperplastic polyps we will consider endoscopic resection depending on what technique needs to be performed for his Barrett's esophagus.  It is not clear with a normal blood count and without severe iron deficiency, that putting him through the risks of bleeding from hyperplastic polyp resection are required per se certainly will consider it with the 1% chance that large hyperplastic/inflammatory polyps could carry dysplasia.  This may be able to be done with his primary gastroenterologist as well but time will tell.  In regards to the patient's belching, he will discuss things further with Dr. Armbruster.  All patient questions were answered to the best of my ability, and the patient agrees to the aforementioned plan of action with follow-up as indicated.   PLAN  Laboratories as outlined below to be obtained today Proceed with scheduled EGD with potential esophageal EMR +/- RFA +/- gastric polyp resection Continue current PPI dosing Continue Carafate dosing Further follow-up/work-up of belching as per Dr. Armbruster   Orders Placed This Encounter  Procedures   CBC   IBC + Ferritin    New Prescriptions   No medications on file   Modified Medications   No medications on file    Planned Follow Up No follow-ups on file.   Total Time in Face-to-Face and in Coordination of Care for patient including independent/personal interpretation/review of prior testing, medical history, examination, medication adjustment, communicating results with the patient directly, and documentation with the EHR is 35 minutes.   Gabriel  Mansouraty, MD [facility] Advanced Endoscopy Office # [phone] 

## 2022-01-11 NOTE — Progress Notes (Signed)
? ?GASTROENTEROLOGY OUTPATIENT CLINIC VISIT  ? ?Primary Care Provider ?Virgie Dad, MD ?[place] ?[place] [id number] ?[phone] ? ?Referring Provider ?Dr. Havery Moros ? ?Patient Profile: ?Ian Rowe is a 81 y.o. male with a pmh significant for allergies, osteoarthritis, hyperlipidemia, gout, GERD, Barrett's esophagus (now progressed to high-grade dysplasia and nodularity on recent biopsies), hyperplastic gastric polyps, colon polyps, hemorrhoids.  The patient presents to the [facility] for an evaluation and management of problem(s) noted below: ? ?Problem List ?1. Barrett's esophagus with high grade dysplasia   ?2. Belching   ?3. Gastric polyps   ?4. History of iron deficiency   ?5. Abnormal endoscopy of upper gastrointestinal tract   ? ? ?History of Present Illness ?Please see prior GI notes for full details of HPI. ? ?Interval History ?I had last seen the patient in 2022 after his endoscopy where we had removed multiple gastric polyps improving his iron deficiency.  I offered him at that time of follow-up with his primary gastroenterologist to undergo repeat endoscopy in 2022 for follow-up of indefinite dysplasia.  He did not have that procedure until this year 2023 by Dr. Havery Moros.  He was found on that endoscopy Results as below, though there were already findings now of potential nodularity with high-grade dysplasia within the Barrett's esophagus and he continued to have large hyperplastic gastric polyps.  It is for this reason that the patient is sent back to me to consider endoscopic resection, Barrett's ablation, and further endoscopic polyp resections if possible.  Patient continues to do well overall.  His main symptom continues to be his belching at the end of the day which remains symptomatic for him but is not impeding his activities of daily living though he wishes that there was some way of finding out what is causing this.  He does not feel weak and does  not have any evidence of anemia based on his labs that are from last year.  He denies any melena. ? ?GI Review of Systems ?Positive as above including periods of distention of the abdomen ?Negative for odynophagia, dysphagia, nausea, vomiting, pain, change in bowel habits, hematochezia ? ?Review of Systems ?General: Denies fevers/chills/weight loss unintentionally ?Cardiovascular: Denies chest pain ?Pulmonary: Denies shortness of breath ?Gastroenterological: See HPI ?Genitourinary: Denies darkened urine ?Hematological: Denies easy bruising/bleeding ?Dermatological: Denies jaundice ?Psychological: Mood is stable ? ? ?Medications ?Current Outpatient Medications  ?Medication Sig Dispense Refill  ? amLODipine (NORVASC) 2.5 MG tablet Take 1 tablet (2.5 mg total) by mouth daily. 60 tablet 3  ? b complex vitamins tablet Take 1 tablet by mouth daily.    ? bimatoprost (LUMIGAN) 0.01 % SOLN Place 1 drop into both eyes at bedtime.    ? cetirizine (ZYRTEC) 10 MG tablet Take 10 mg by mouth daily as needed for allergies.     ? Cholecalciferol (VITAMIN D) 2000 UNITS CAPS Take 1 capsule (2,000 Units total) by mouth daily. 30 capsule 3  ? fluticasone (FLONASE) 50 MCG/ACT nasal spray Place 2 sprays into both nostrils daily as needed for allergies or rhinitis.    ? folic acid (FOLVITE) 536 MCG tablet Take 800 mcg by mouth daily.     ? Glucosamine-Chondroit-Vit C-Mn (GLUCOSAMINE CHONDR 1500 COMPLX PO) Take 2 tablets by mouth daily.     ? omeprazole (PRILOSEC) 40 MG capsule Take 1 capsule (40 mg total) by mouth 2 (two) times daily before a meal. 180 capsule 3  ? rosuvastatin (CRESTOR) 5 MG tablet Take 1 tablet (5 mg  total) by mouth daily. 90 tablet 2  ? sucralfate (CARAFATE) 1 GM/10ML suspension Take 10 mLs (1 g total) by mouth every 12 (twelve) hours. 600 mL 0  ? timolol (TIMOPTIC) 0.5 % ophthalmic solution Place 1 drop into both eyes daily.     ? vitamin B-12 (CYANOCOBALAMIN) 1000 MCG tablet Take 1,000 mcg by mouth daily.     ? ?Current Facility-Administered Medications  ?Medication Dose Route Frequency Provider Last Rate Last Admin  ? 0.9 %  sodium chloride infusion  500 mL Intravenous Continuous Armbruster, Carlota Raspberry, MD      ? ? ?Allergies ?Allergies  ?Allergen Reactions  ? Black Pepper [[place]] Other (See Comments)  ?  bloating  ? Ciprofloxacin Other (See Comments)  ?  Bleeding-blood in stool ?  ? Nsaids   ?  History of bleeding  ? ? ?Histories ?Past Medical History:  ?Diagnosis Date  ? Allergy   ? seasonal  ? Arthritis   ? knees  ? Barrett's esophagus   ? Cataract   ? BILATERAL REMOVED  ? Gastric polyp   ? GERD (gastroesophageal reflux disease)   ? Glaucoma   ? Hemorrhoids   ? Hyperlipidemia   ? Hypertension   ? Pneumonia   ? as teenager  ? RBBB   ? RBBB   ? Vasovagal syncope   ? last episode was 2015-16; none since  ? Wears glasses   ? ?Past Surgical History:  ?Procedure Laterality Date  ? BIOPSY  01/16/2020  ? Procedure: BIOPSY;  Surgeon: Irving Copas., MD;  Location: [place];  Service: Gastroenterology;;  ? [place]  ? CATARACT EXTRACTION W/ INTRAOCULAR LENS  IMPLANT, BILATERAL  2021  ? COLONOSCOPY  2002; 2007  ? ENDOSCOPIC MUCOSAL RESECTION N/A 01/16/2020  ? Procedure: ENDOSCOPIC MUCOSAL RESECTION;  Surgeon: Rush Landmark Telford Nab., MD;  Location: [place];  Service: Gastroenterology;  Laterality: N/A;  ? ESOPHAGOGASTRODUODENOSCOPY (EGD) WITH PROPOFOL N/A 01/16/2020  ? Procedure: ESOPHAGOGASTRODUODENOSCOPY (EGD) WITH PROPOFOL;  Surgeon: Rush Landmark Telford Nab., MD;  Location: [place];  Service: Gastroenterology;  Laterality: N/A;  ? HEMOSTASIS CLIP PLACEMENT  01/16/2020  ? Procedure: HEMOSTASIS CLIP PLACEMENT;  Surgeon: Irving Copas., MD;  Location: [place];  Service: Gastroenterology;;  ? HEMOSTASIS CONTROL  01/16/2020  ? Procedure: HEMOSTASIS CONTROL;  Surgeon: Rush Landmark Telford Nab., MD;  Location: [place];  Service: Gastroenterology;;  ? POLYPECTOMY    ? SUBMUCOSAL  LIFTING INJECTION  01/16/2020  ? Procedure: SUBMUCOSAL LIFTING INJECTION;  Surgeon: Irving Copas., MD;  Location: [place];  Service: Gastroenterology;;  ? WISDOM TOOTH EXTRACTION    ? ?Social History  ? ?Socioeconomic History  ? Marital status: Married  ?  Spouse name: Mardene Celeste  ? Number of children: 2  ? Years of education: Not on file  ? Highest education level: Not on file  ?Occupational History  ? Occupation: Retired Clinical biochemist  ?Tobacco Use  ? Smoking status: Never  ? Smokeless tobacco: Never  ?Vaping Use  ? Vaping Use: Never used  ?Substance and Sexual Activity  ? Alcohol use: Yes  ?  Alcohol/week: 10.0 standard drinks  ?  Types: 7 Shots of liquor, 3 Glasses of wine per week  ?  Comment: 2 scotch at bedtime nightly, occ glass of wine   ? Drug use: No  ? Sexual activity: Not on file  ?Other Topics Concern  ? Not on file  ?Social History Narrative  ? Moved to [place] 2014.  ? Married  ? Never  smoked  ? Alcohol - scotch or wine nightly  ? Walks 150 minutes per week.   ? POA  ? ?Social Determinants of Health  ? ?Financial Resource Strain: Not on file  ?Food Insecurity: Not on file  ?Transportation Needs: Not on file  ?Physical Activity: Not on file  ?Stress: Not on file  ?Social Connections: Not on file  ?Intimate Partner Violence: Not on file  ? ?Family History  ?Problem Relation Age of Onset  ? Alzheimer's disease Mother   ? Heart disease Father   ? Colon cancer Neg Hx   ? Colon polyps Neg Hx   ? Esophageal cancer Neg Hx   ? Stomach cancer Neg Hx   ? Rectal cancer Neg Hx   ? Inflammatory bowel disease Neg Hx   ? Liver disease Neg Hx   ? Pancreatic cancer Neg Hx   ? ?I have reviewed his medical, social, and family history in detail and updated the electronic medical record as necessary.  ? ? ?PHYSICAL EXAMINATION  ?BP 126/80   Pulse 82   Ht '5\' 7"'$  (1.702 m)   Wt 172 lb (78 kg)   BMI 26.94 kg/m?  ?GEN: NAD, appears stated age, doesn't appear chronically ill ?PSYCH:  Cooperative, without pressured speech ?EYE: Conjunctivae pink, sclerae anicteric ?ENT: MMM ?CV: Nontachycardic ?RESP: No audible wheezing ?GI: NABS, soft, NT/ND, without rebound ?MSK/EXT: No lower extremity edema ?SKIN: No

## 2022-01-15 ENCOUNTER — Ambulatory Visit: Payer: Medicare Other | Attending: Internal Medicine

## 2022-01-15 DIAGNOSIS — Z23 Encounter for immunization: Secondary | ICD-10-CM

## 2022-01-15 DIAGNOSIS — H52203 Unspecified astigmatism, bilateral: Secondary | ICD-10-CM | POA: Diagnosis not present

## 2022-01-15 DIAGNOSIS — Z961 Presence of intraocular lens: Secondary | ICD-10-CM | POA: Diagnosis not present

## 2022-01-15 DIAGNOSIS — H401132 Primary open-angle glaucoma, bilateral, moderate stage: Secondary | ICD-10-CM | POA: Diagnosis not present

## 2022-01-16 ENCOUNTER — Other Ambulatory Visit (HOSPITAL_BASED_OUTPATIENT_CLINIC_OR_DEPARTMENT_OTHER): Payer: Self-pay

## 2022-01-16 MED ORDER — MODERNA COVID-19 BIVAL BOOSTER 50 MCG/0.5ML IM SUSP
INTRAMUSCULAR | 0 refills | Status: DC
  Filled 2022-01-16: qty 0.5, 1d supply, fill #0

## 2022-01-16 NOTE — Progress Notes (Signed)
? ?  Covid-19 Vaccination Clinic ? ?Name:  Ian Rowe    ?MRN: [id number] ?DOB: 1941-08-15 ? ?01/16/2022 ? ?Ian Rowe was observed post Covid-19 immunization for 15 minutes without incident. He was provided with Vaccine Information Sheet and instruction to access the V-Safe system.  ? ?Ian Rowe was instructed to call 911 with any severe reactions post vaccine: ?Difficulty breathing  ?Swelling of face and throat  ?A fast heartbeat  ?A bad rash all over body  ?Dizziness and weakness  ? ?Immunizations Administered   ? ? Name Date Dose VIS Date Route  ? Moderna Covid-19 vaccine Bivalent Booster 01/15/2022  2:38 PM 0.5 mL 04/27/2021 Intramuscular  ? Manufacturer: Moderna  ? Lot: [id number]  ? [place]: [id number]  ? ?  ? ? ?

## 2022-01-21 ENCOUNTER — Other Ambulatory Visit: Payer: Self-pay | Admitting: Internal Medicine

## 2022-01-21 ENCOUNTER — Telehealth: Payer: Self-pay

## 2022-01-21 DIAGNOSIS — E782 Mixed hyperlipidemia: Secondary | ICD-10-CM

## 2022-01-21 MED ORDER — AMLODIPINE BESYLATE 2.5 MG PO TABS: 2.5000 mg | ORAL_TABLET | Freq: Every day | ORAL | 1 refills | Status: DC

## 2022-01-21 NOTE — Telephone Encounter (Signed)
Refill request recieved ?

## 2022-01-27 ENCOUNTER — Encounter (HOSPITAL_COMMUNITY): Payer: Self-pay | Admitting: Gastroenterology

## 2022-01-28 ENCOUNTER — Other Ambulatory Visit: Payer: Self-pay

## 2022-01-28 DIAGNOSIS — K22711 Barrett's esophagus with high grade dysplasia: Secondary | ICD-10-CM

## 2022-01-28 DIAGNOSIS — K317 Polyp of stomach and duodenum: Secondary | ICD-10-CM

## 2022-02-03 ENCOUNTER — Encounter (HOSPITAL_COMMUNITY)
Admission: RE | Disposition: A | Discharge status: Home / Self Care | Payer: Self-pay | Source: Home / Self Care | Attending: Gastroenterology

## 2022-02-03 ENCOUNTER — Other Ambulatory Visit: Payer: Self-pay

## 2022-02-03 ENCOUNTER — Ambulatory Visit (HOSPITAL_COMMUNITY)
Admission: RE | Admit: 2022-02-03 | Discharge: 2022-02-03 | Disposition: A | Discharge status: Home / Self Care | Payer: Medicare Other | Attending: Gastroenterology | Admitting: Gastroenterology

## 2022-02-03 ENCOUNTER — Ambulatory Visit (HOSPITAL_COMMUNITY): Payer: Medicare Other | Admitting: Anesthesiology

## 2022-02-03 ENCOUNTER — Ambulatory Visit (HOSPITAL_BASED_OUTPATIENT_CLINIC_OR_DEPARTMENT_OTHER): Payer: Medicare Other | Admitting: Anesthesiology

## 2022-02-03 ENCOUNTER — Encounter (HOSPITAL_COMMUNITY): Payer: Self-pay | Admitting: Gastroenterology

## 2022-02-03 DIAGNOSIS — K219 Gastro-esophageal reflux disease without esophagitis: Secondary | ICD-10-CM | POA: Insufficient documentation

## 2022-02-03 DIAGNOSIS — K2270 Barrett's esophagus without dysplasia: Secondary | ICD-10-CM

## 2022-02-03 DIAGNOSIS — K317 Polyp of stomach and duodenum: Secondary | ICD-10-CM | POA: Diagnosis not present

## 2022-02-03 DIAGNOSIS — Z86010 Personal history of colonic polyps: Secondary | ICD-10-CM | POA: Insufficient documentation

## 2022-02-03 DIAGNOSIS — E785 Hyperlipidemia, unspecified: Secondary | ICD-10-CM | POA: Insufficient documentation

## 2022-02-03 DIAGNOSIS — K3189 Other diseases of stomach and duodenum: Secondary | ICD-10-CM | POA: Diagnosis not present

## 2022-02-03 DIAGNOSIS — I10 Essential (primary) hypertension: Secondary | ICD-10-CM | POA: Diagnosis not present

## 2022-02-03 DIAGNOSIS — D509 Iron deficiency anemia, unspecified: Secondary | ICD-10-CM | POA: Insufficient documentation

## 2022-02-03 DIAGNOSIS — K22711 Barrett's esophagus with high grade dysplasia: Secondary | ICD-10-CM

## 2022-02-03 DIAGNOSIS — K449 Diaphragmatic hernia without obstruction or gangrene: Secondary | ICD-10-CM | POA: Insufficient documentation

## 2022-02-03 DIAGNOSIS — K2950 Unspecified chronic gastritis without bleeding: Secondary | ICD-10-CM | POA: Diagnosis not present

## 2022-02-03 DIAGNOSIS — K259 Gastric ulcer, unspecified as acute or chronic, without hemorrhage or perforation: Secondary | ICD-10-CM | POA: Diagnosis not present

## 2022-02-03 DIAGNOSIS — K319 Disease of stomach and duodenum, unspecified: Secondary | ICD-10-CM | POA: Diagnosis not present

## 2022-02-03 HISTORY — PX: ENDOSCOPIC MUCOSAL RESECTION: SHX6839

## 2022-02-03 HISTORY — PX: GI RADIOFREQUENCY ABLATION: SHX6807

## 2022-02-03 HISTORY — PX: ESOPHAGOGASTRODUODENOSCOPY (EGD) WITH PROPOFOL: SHX5813

## 2022-02-03 HISTORY — PX: HEMOSTASIS CLIP PLACEMENT: SHX6857

## 2022-02-03 HISTORY — PX: SCHLEROTHERAPY: SHX5440

## 2022-02-03 HISTORY — PX: POLYPECTOMY: SHX5525

## 2022-02-03 SURGERY — ESOPHAGOGASTRODUODENOSCOPY (EGD) WITH PROPOFOL
Anesthesia: Monitor Anesthesia Care

## 2022-02-03 MED ORDER — ACETAMINOPHEN-CODEINE 120-12 MG/5ML PO SOLN: 5.0000 mL | Freq: Four times a day (QID) | ORAL | 0 refills | Status: DC | PRN

## 2022-02-03 MED ORDER — PROPOFOL 10 MG/ML IV BOLUS
INTRAVENOUS | Status: AC
  Filled 2022-02-03: qty 20

## 2022-02-03 MED ORDER — SUCRALFATE 1 GM/10ML PO SUSP: 1.0000 g | Freq: Four times a day (QID) | ORAL | 1 refills | Status: DC

## 2022-02-03 MED ORDER — OMEPRAZOLE 40 MG PO CPDR: 40.0000 mg | DELAYED_RELEASE_CAPSULE | Freq: Two times a day (BID) | ORAL | 4 refills | Status: DC

## 2022-02-03 MED ORDER — PROPOFOL 500 MG/50ML IV EMUL
INTRAVENOUS | Status: DC | PRN
  Administered 2022-02-03: 125 ug/kg/min via INTRAVENOUS

## 2022-02-03 MED ORDER — PROPOFOL 500 MG/50ML IV EMUL
INTRAVENOUS | Status: AC
Start: 2022-02-03 — End: ?
  Filled 2022-02-03: qty 50

## 2022-02-03 MED ORDER — STERILE WATER FOR INJECTION IJ SOLN
INTRAMUSCULAR | Status: DC | PRN
  Administered 2022-02-03: 60 mL via OROMUCOSAL

## 2022-02-03 MED ORDER — LIDOCAINE VISCOUS HCL 2 % MT SOLN: 30.0000 mL | Freq: Four times a day (QID) | OROMUCOSAL | 1 refills | Status: DC

## 2022-02-03 MED ORDER — SODIUM CHLORIDE (PF) 0.9 % IJ SOLN
INTRAMUSCULAR | Status: DC | PRN
  Administered 2022-02-03: 2 mL
  Administered 2022-02-03: 1.5 mL
  Administered 2022-02-03: 2 mL

## 2022-02-03 MED ORDER — ACETYLCYSTEINE 20 % IN SOLN
RESPIRATORY_TRACT | Status: AC
  Filled 2022-02-03: qty 4

## 2022-02-03 MED ORDER — LACTATED RINGERS IV SOLN
INTRAVENOUS | Status: AC | PRN
Start: 2022-02-03 — End: 2022-02-03
  Administered 2022-02-03: 1000 mL via INTRAVENOUS

## 2022-02-03 MED ORDER — PROPOFOL 10 MG/ML IV BOLUS
INTRAVENOUS | Status: DC | PRN
  Administered 2022-02-03: 20 mg via INTRAVENOUS

## 2022-02-03 MED ORDER — LIDOCAINE 2% (20 MG/ML) 5 ML SYRINGE
INTRAMUSCULAR | Status: DC | PRN
  Administered 2022-02-03: 60 mg via INTRAVENOUS

## 2022-02-03 MED ORDER — SODIUM CHLORIDE 0.9 % IV SOLN: INTRAVENOUS | Status: DC

## 2022-02-03 SURGICAL SUPPLY — 15 items

## 2022-02-03 NOTE — Anesthesia Procedure Notes (Signed)
Procedure Name: MAC Date/Time: 02/03/2022 8:51 AM Performed by: Lollie Sails, CRNA Pre-anesthesia Checklist: Patient identified, Emergency Drugs available, Suction available, Patient being monitored and Timeout performed Oxygen Delivery Method: Nasal cannula Placement Confirmation: positive ETCO2

## 2022-02-03 NOTE — Anesthesia Preprocedure Evaluation (Addendum)
Anesthesia Evaluation  Patient identified by MRN, date of birth, ID band Patient awake    Reviewed: Allergy & Precautions, NPO status , Patient's Chart, lab work & pertinent test results  History of Anesthesia Complications Negative for: history of anesthetic complications  Airway Mallampati: II  TM Distance: >3 FB Neck ROM: Full    Dental   Pulmonary neg pulmonary ROS,    Pulmonary exam normal        Cardiovascular hypertension, Pt. on medications Normal cardiovascular exam     Neuro/Psych negative neurological ROS     GI/Hepatic Neg liver ROS, GERD  ,  Endo/Other  negative endocrine ROS  Renal/GU negative Renal ROS  negative genitourinary   Musculoskeletal negative musculoskeletal ROS (+)   Abdominal   Peds  Hematology negative hematology ROS (+)   Anesthesia Other Findings   Reproductive/Obstetrics                            Anesthesia Physical Anesthesia Plan  ASA: 2  Anesthesia Plan: MAC   Post-op Pain Management: Minimal or no pain anticipated   Induction: Intravenous  PONV Risk Score and Plan: 1 and Propofol infusion, TIVA and Treatment may vary due to age or medical condition  Airway Management Planned: Natural Airway, Nasal Cannula and Simple Face Mask  Additional Equipment: None  Intra-op Plan:   Post-operative Plan:   Informed Consent: I have reviewed the patients History and Physical, chart, labs and discussed the procedure including the risks, benefits and alternatives for the proposed anesthesia with the patient or authorized representative who has indicated his/her understanding and acceptance.       Plan Discussed with:   Anesthesia Plan Comments:        Anesthesia Quick Evaluation

## 2022-02-03 NOTE — Interval H&P Note (Signed)
History and Physical Interval Note:  02/03/2022 7:47 AM  Ian Rowe  has presented today for surgery, with the diagnosis of barretts.  The various methods of treatment have been discussed with the patient and family. After consideration of risks, benefits and other options for treatment, the patient has consented to  Procedure(s): ESOPHAGOGASTRODUODENOSCOPY (EGD) WITH PROPOFOL (N/A) ENDOSCOPIC MUCOSAL RESECTION (N/A) UPPER ENDOSCOPIC ULTRASOUND (EUS) RADIAL (N/A) as a surgical intervention.  The patient's history has been reviewed, patient examined, no change in status, stable for surgery.  I have reviewed the patient's chart and labs.  Questions were answered to the patient's satisfaction.    Possible EMR of Barrett's with high-grade dysplasia nodular area noted previously, if still present we will proceed with EMR.  If no longer present we will consider repeat biopsy versus moving forward with RFA ablation today.   [employer]

## 2022-02-03 NOTE — Transfer of Care (Signed)
Immediate Anesthesia Transfer of Care Note  Patient: Ian Rowe  Procedure(s) Performed: ESOPHAGOGASTRODUODENOSCOPY (EGD) WITH PROPOFOL ENDOSCOPIC MUCOSAL RESECTION POLYPECTOMY SCHLEROTHERAPY HEMOSTASIS CLIP PLACEMENT GI RADIOFREQUENCY ABLATION  Patient Location: PACU and Endoscopy Unit  Anesthesia Type:MAC  Level of Consciousness: awake and patient cooperative  Airway & Oxygen Therapy: Patient Spontanous Breathing and Patient connected to nasal cannula oxygen  Post-op Assessment: Report given to RN, Post -op Vital signs reviewed and stable and BP 161/92  Post vital signs: Reviewed and stable  Last Vitals:  Vitals Value Taken Time  BP    Temp    Pulse 70 02/03/22 1013  Resp 22 02/03/22 1013  SpO2 98 % 02/03/22 1013  Vitals shown include unvalidated device data.  Last Pain:  Vitals:   02/03/22 0759  TempSrc: Oral         Complications: No notable events documented.

## 2022-02-03 NOTE — Anesthesia Postprocedure Evaluation (Signed)
Anesthesia Post Note  Patient: Ian Rowe  Procedure(s) Performed: ESOPHAGOGASTRODUODENOSCOPY (EGD) WITH PROPOFOL ENDOSCOPIC MUCOSAL RESECTION POLYPECTOMY SCHLEROTHERAPY HEMOSTASIS CLIP PLACEMENT GI [place]     Patient location during evaluation: Endoscopy Anesthesia Type: MAC Level of consciousness: awake and alert Pain management: pain level controlled Vital Signs Assessment: post-procedure vital signs reviewed and stable Respiratory status: spontaneous breathing, nonlabored ventilation and respiratory function stable Cardiovascular status: blood pressure returned to baseline and stable Postop Assessment: no apparent nausea or vomiting Anesthetic complications: no   No notable events documented.  Last Vitals:  Vitals:   02/03/22 1030 02/03/22 1040  BP: (!) 184/89 (!) 205/91  Pulse: 62 60  Resp: 15 15  Temp: (!) 36.3 C   SpO2: 98% 98%    Last Pain:  Vitals:   02/03/22 1030  TempSrc: Tympanic  PainSc: 0-No pain                 Lidia Collum

## 2022-02-03 NOTE — Op Note (Addendum)
[facility] Patient Name: Ian Rowe Procedure Date: 02/03/2022 MRN: [id number] Attending MD: Justice Britain , MD Date of Birth: 1941-03-24 CSN: [id number] Age: 81 Admit Type: Outpatient Procedure:                Upper GI endoscopy Indications:              Iron deficiency anemia, For endoscopic therapy of                            Barrett's esophagus with high grade dysplasia,                            Follow-up of gastric polyps, For therapy of gastric                            polyps Providers:                Justice Britain, MD, Burtis Junes, RN, [facility]                            Technician, Technician Referring MD:             Carlota Raspberry. Havery Moros, MD, Rene Kocher. Lyndel Safe Medicines:                Monitored Anesthesia Care Complications:            No immediate complications. Estimated Blood Loss:     Estimated blood loss was minimal. Procedure:                Pre-Anesthesia Assessment:                           - Prior to the procedure, a History and Physical                            was performed, and patient medications and                            allergies were reviewed. The patient's tolerance of                            previous anesthesia was also reviewed. The risks                            and benefits of the procedure and the sedation                            options and risks were discussed with the patient.                            All questions were answered, and informed consent                            was obtained. Prior Anticoagulants: The patient has  taken no previous anticoagulant or antiplatelet                            agents. ASA Grade Assessment: III - A patient with                            severe systemic disease. After reviewing the risks                            and benefits, the patient was deemed in                            satisfactory condition to undergo the procedure.                            After obtaining informed consent, the endoscope was                            passed under direct vision. Throughout the                            procedure, the patient's blood pressure, pulse, and                            oxygen saturations were monitored continuously. The                            [id number] ([id number]) Olympus therapeutic endoscope                            was introduced through the mouth, and advanced to                            the second part of duodenum. The upper GI endoscopy                            was accomplished without difficulty. The patient                            tolerated the procedure. Scope In: Scope Out: Findings:      No gross lesions were noted in the proximal esophagus and in the mid       esophagus.      The esophagus and gastroesophageal junction were examined with white       light and narrow band imaging (NBI) from a forward view and retroflexed       position. There were esophageal mucosal changes secondary to established       short-segment Barrett's disease. These changes involved the mucosa at       the upper extent of the gastric folds (35 cm from the incisors)       extending to the Z-line (34 cm from the incisors). Circumferential       salmon-colored mucosa was present from 33 to 34 cm, one tongue of       salmon-colored mucosa was present from 32 to  34 cm and scattered islands       of salmon-colored mucosa were present from 31 to 34 cm. The maximum       longitudinal extent of these esophageal mucosal changes was 3 cm in       length. I looked thoroughly with white-light and with NBI, and I could       not see any evidence of nodularity being present in the tissue as had       last endoscopy suggested. Thus no EMR was performed. Focal       radiofrequency ablation of Barrett's esophagus was performed. With the       endoscope in place, the position and extent of the Barrett's mucosa and       the  anatomic landmarks including proximal and distal extent of Barrett's       mucosa and top of gastric folds were noted. Endoscopic visualization       identified an ablation site including the entire visible Barrett's       segment. The Barrett's mucosa was irrigated with N-acetylcysteine       (Mucomyst) 1% mixed with water. Esophageal contents were suctioned. I       tried with multiple positions of the RFA 90 catheter as well as       positioning of the patient but could not traverse the cervical esophagus       - query an osteophyte being in place. I then transitioned to the the       through the scope RFA catheter and this was introduced through the       endoscope working channel. Barrett's tissue was targeted. The endoscope       with the ablation catheter was advanced to the areas of Barrett's       mucosa. The areas included islands, tongues and circumferential areas of       Barrett's mucosa. The endoscope with the channel ablation catheter was       positioned under direct visualization so that the catheter was placed in       contact with the surface of the Barrett's mucosa. Energy was applied       twice at 12 J/cm2. Ablation was repeated in a likewise fashion to treat       all visible Barrett's mucosa. The channel ablation catheter was then       removed through the endoscope working channel, and the ablation catheter       was cleaned. The endoscope was left in place. The ablation zone was       cleaned of coagulative debris. The ablation catheter was reinserted into       the endoscope working channel. A second round of ablation was then       performed. Energy was applied once at 12 J/cm2 to retreat the areas of       Barrett's epithelium that had been treated with the first series of       ablation. The ablation catheter was removed through the endoscope       working channel. The areas of the esophagus where Barrett's mucosa had       been ablated were examined. Areas of  visible Barrett's esophagus were       completely ablated. Whitish changes of ablated mucosa were present. The       total number of energy applications for all mucosal sites treated was       72. The  endoscope was then removed.      A 6 cm hiatal hernia was present.      Greater than 5 to 50 mm pedunculated and sessile polyps with no bleeding       and stigmata of recent bleeding on 4 of them were found in the cardia,       in the gastric fundus and in the gastric body. Three of these polyps       were were removed with a lift and cut technique using a hot snare and       1:100000 epinehprine (total of 2.5 mL used for the three sites in       total). Resection and retrieval were complete. Preparations were made       for mucosal resection of the larger polyp (50 mm) in the proximal body.       NBI imaging and White-light endoscopy was done to demarcate the borders       of the lesion. A 1:100,000 solution of epinephrine was injected to raise       the lesion. Using a polyloop, to try and decrease risk of bleeding, this       was carefully placed over the polyp to the stalk. As this was closed,       unfortunately, the polyloop went through the tissue of the polyp base.       This led to minor oozing. Coagulation for hemostasis of bleeding caused       by the procedure using snare tip coagulation was successful. To further       prevent bleeding post-maneuver, four hemostatic clips were successfully       placed (MR conditional). There was no bleeding at the end of the       procedure. Mucosal resection was successful and retrieval were complete.      Patchy moderately erythematous mucosa without bleeding was found in the       entire examined stomach - previously biopsied.      No gross lesions were noted in the duodenal bulb, in the first portion       of the duodenum and in the second portion of the duodenum. Impression:               - No gross lesions in esophagus proximally.                             Esophageal mucosal changes secondary to established                            short-segment Barrett's disease with no evidence of                            nodularity being seen today. No evidence of                            esophagitis present either. Treated with                            radiofrequency ablation with TTS catheter for                            reasons noted below with good success  throughout                            the area..                           - 6 cm hiatal hernia.                           - Greater than 30 gastric polyps. 4 of them had                            evidence of recent bleeding. 3 were resected with                            hot snare. 1 was resected via mucosal resection                            after Epinephrine injection, but Polyloop to the                            based caused polyp to be removed without intention                            leading to some oozing. This was treated with                            coagulation with good effect and then clips were                            placed for further hemostasis. All tissue retrieved.                           - Erythematous mucosa in the stomach.                           - No gross lesions in the duodenal bulb, in the                            first portion of the duodenum and in the second                            portion of the duodenum. Moderate Sedation:      Not Applicable - Patient had care per Anesthesia. Recommendation:           - The patient will be observed post-procedure,                            until all discharge criteria are met.                           - Discharge patient to home.                           - Patient  has a contact number available for                            emergencies. The signs and symptoms of potential                            delayed complications were discussed with the                            patient.  Return to normal activities tomorrow.                            Written discharge instructions were provided to the                            patient.                           - Clear liquid diet for next 24 hours. Full liquid                            diet for next 48 hours. If tolerating this, then                            can transition to soft diet thereafter. Recommend                            soft diet for 1 week thereafter. Then advance your                            diet thereafter to regular.                           - Continue your PPI 40 mg twice daily for the next                            3 months.                           - You will be prescribed a lidocaine/antiacid                            mixture that you can take up to 4 times per day as                            needed (prescription has been sent to your                            pharmacy) - take no matter what for at least first                            3-days.                           -  You will be prescribed liquid Tylenol with                            codeine that you can use up to 4 times daily for                            the next 72 hours if needed (prescription to be                            sent to your pharmacy) - I recommend using this                            even if you do not have pain for the first 2-3 days.                           - You will be prescribed Sucralfate Suspension and                            you will take this 4 times daily (make sure you are                            not taking any other medication 1 hour before or 1                            hour after this medication is taken) to allow                            coating and healing of your esophagus - this needs                            to be taken for at least 25-month(you should have                            refills available but call if you run out).                           - Please use Cepacol or  Halls Lozenges +/-                            Chloraseptic spray for next 72-96 hours to aid in                            sore thoat should you experience this.                           - The findings and recommendations were discussed                            with the patient.                           -  The findings and recommendations were discussed                            with the patient's family. Procedure Code(s):        --- Professional ---                           [id number], Esophagogastroduodenoscopy, flexible,                            transoral; with ablation of tumor(s), polyp(s), or                            other lesion(s) (includes pre- and post-dilation                            and guide wire passage, when performed) Diagnosis Code(s):        --- Professional ---                           K44.9, Diaphragmatic hernia without obstruction or                            gangrene                           K31.7, Polyp of stomach and duodenum                           K31.89, Other diseases of stomach and duodenum                           D50.9, Iron deficiency anemia, unspecified                           V70.340, Barrett's esophagus with high grade                            dysplasia CPT copyright 2019 [employer]. All rights reserved. The codes documented in this report are preliminary and upon coder review may  be revised to meet current compliance requirements. Justice Britain, MD 02/03/2022 10:38:25 AM Number of Addenda: 0

## 2022-02-03 NOTE — Discharge Instructions (Addendum)
YOU HAD AN ENDOSCOPIC PROCEDURE TODAY: Refer to the procedure report and other information in the discharge instructions given to you for any specific questions about what was found during the examination. If this information does not answer your questions, please call [place] office at [phone] to clarify.   YOU SHOULD EXPECT: Some feelings of bloating in the abdomen. Passage of more gas than usual. Walking can help get rid of the air that was put into your GI tract during the procedure and reduce the bloating. If you had a lower endoscopy (such as a colonoscopy or flexible sigmoidoscopy) you may notice spotting of blood in your stool or on the toilet paper. Some abdominal soreness may be present for a day or two, also.  DIET: Your first meal following the procedure should be a light meal and then it is ok to progress to your normal diet. A half-sandwich or bowl of soup is an example of a good first meal. Heavy or fried foods are harder to digest and may make you feel nauseous or bloated. Drink plenty of fluids but you should avoid alcoholic beverages for 24 hours. If you had a esophageal dilation, please see attached instructions for diet.    ACTIVITY: Your care partner should take you home directly after the procedure. You should plan to take it easy, moving slowly for the rest of the day. You can resume normal activity the day after the procedure however YOU SHOULD NOT DRIVE, use power tools, machinery or perform tasks that involve climbing or major physical exertion for 24 hours (because of the sedation medicines used during the test).   SYMPTOMS TO REPORT IMMEDIATELY: A gastroenterologist can be reached at any hour. Please call [phone]  for any of the following symptoms:   Following upper endoscopy (EGD, EUS, ERCP, esophageal dilation) Vomiting of blood or coffee ground material  New, significant abdominal pain  New, significant chest pain or pain under the shoulder blades  Painful or  persistently difficult swallowing  New shortness of breath  Black, tarry-looking or red, bloody stools  FOLLOW UP:  If any biopsies were taken you will be contacted by phone or by letter within the next 1-3 weeks. Call [phone]  if you have not heard about the biopsies in 3 weeks.  Please also call with any specific questions about appointments or follow up tests.  

## 2022-02-04 ENCOUNTER — Encounter (HOSPITAL_COMMUNITY): Payer: Self-pay | Admitting: Gastroenterology

## 2022-02-05 LAB — SURGICAL PATHOLOGY

## 2022-02-07 ENCOUNTER — Telehealth: Payer: Self-pay | Admitting: Gastroenterology

## 2022-02-07 ENCOUNTER — Encounter: Payer: Self-pay | Admitting: Gastroenterology

## 2022-02-07 NOTE — Telephone Encounter (Signed)
Left message on machine to call back  

## 2022-02-07 NOTE — Telephone Encounter (Signed)
Inbound call from patient requesting to go over results from procedure done on 05/22. Please advise.

## 2022-02-07 NOTE — Telephone Encounter (Signed)
[facility] [address] [place] [address]   Dear Mr. Vuolo,   I am writing to inform you that the pathology taken during your recent endoscopic examination showed:     FINAL MICROSCOPIC DIAGNOSIS:  A. STOMACH, EMR, POLYPECTOMY:  Hyperplastic polyp with surface erosion and ulceration  Reactive gastropathy with activity and chronic active gastritis  Helicobacter stain negative (IHC, adequate control)  Negative for intestinal metaplasia, dysplasia and carcinoma     What this is all mean? The large polyp at other small polyps that we removed returned as a hyperplastic polyp.  There was also some reactive inflammation known as gastritis/gastropathy.  No H. pylori infection was found.   Hopefully the removal of these polyps will help decrease your risk of chronic blood loss anemia.   Please continue the medications as per the procedure note.   We will see you in 4 months for your repeat endoscopy for continued radiofrequency ablation of your Barrett's esophagus.   If you have other GI issues please follow-up with Dr. Havery Moros in the interim.  He will decide when additional blood work will need to be obtained.   Please call us at Dept: [phone] if you have persistent problems or have questions about your condition that have not been fully answered at this time.   Sincerely,   Irving Copas., MD            [place], [phone]                                 2

## 2022-02-11 NOTE — Telephone Encounter (Signed)
Patient is returning your call.  

## 2022-02-11 NOTE — Telephone Encounter (Signed)
The pt has questions about his pathology from recent EGD.  We discussed the letter that was mailed and sent to My Chart. All questions have been answered to the best of my ability. The pt has been advised that he will get a letter at the time he is due for repeat EGD.  He also asked if he could advance his diet.  I did advise that he can advance as tolerated.  The pt has been advised of the information and verbalized understanding.    He will call back if he has any further concerns or questions.

## 2022-03-20 DIAGNOSIS — R739 Hyperglycemia, unspecified: Secondary | ICD-10-CM | POA: Diagnosis not present

## 2022-03-20 DIAGNOSIS — E785 Hyperlipidemia, unspecified: Secondary | ICD-10-CM | POA: Diagnosis not present

## 2022-03-20 LAB — BASIC METABOLIC PANEL
BUN: 10 (ref 4–21)
CO2: 29 — AB (ref 13–22)
Chloride: 102 (ref 99–108)
Creatinine: 1 (ref 0.6–1.3)
Glucose: 107
Potassium: 4.1 mEq/L (ref 3.5–5.1)
Sodium: 139 (ref 137–147)

## 2022-03-20 LAB — CBC AND DIFFERENTIAL
HCT: 43 (ref 41–53)
Hemoglobin: 14.7 (ref 13.5–17.5)
Platelets: 229 10*3/uL (ref 150–400)
WBC: 5.9

## 2022-03-20 LAB — LIPID PANEL
Cholesterol: 151 (ref 0–200)
HDL: 49 (ref 35–70)
LDL Cholesterol: 73
LDl/HDL Ratio: 3.1
Triglycerides: 150 (ref 40–160)

## 2022-03-20 LAB — CBC: RBC: 4.63 (ref 3.87–5.11)

## 2022-03-20 LAB — COMPREHENSIVE METABOLIC PANEL: Calcium: 9.1 (ref 8.7–10.7)

## 2022-03-20 LAB — HEMOGLOBIN A1C: Hemoglobin A1C: 6.1

## 2022-04-02 ENCOUNTER — Non-Acute Institutional Stay: Payer: Medicare Other | Admitting: Internal Medicine

## 2022-04-02 ENCOUNTER — Encounter: Payer: Self-pay | Admitting: Internal Medicine

## 2022-04-02 VITALS — BP 138/88 | HR 76 | Temp 97.3°F | Ht 67.0 in | Wt 172.0 lb

## 2022-04-02 DIAGNOSIS — K22719 Barrett's esophagus with dysplasia, unspecified: Secondary | ICD-10-CM

## 2022-04-02 DIAGNOSIS — K219 Gastro-esophageal reflux disease without esophagitis: Secondary | ICD-10-CM | POA: Diagnosis not present

## 2022-04-02 DIAGNOSIS — R600 Localized edema: Secondary | ICD-10-CM | POA: Diagnosis not present

## 2022-04-02 DIAGNOSIS — I10 Essential (primary) hypertension: Secondary | ICD-10-CM

## 2022-04-02 DIAGNOSIS — E782 Mixed hyperlipidemia: Secondary | ICD-10-CM

## 2022-04-02 NOTE — Progress Notes (Signed)
Location:  [place] of Service:  Clinic (12)  Provider:   Code Status:  Goals of Care:     04/02/2022    9:05 AM  Advanced Directives  Does Patient Have a Medical Advance Directive? Yes  Type of Advance Directive [place]  Does patient want to make changes to medical advance directive? No - Patient declined  Copy of [place] in Chart? Yes - validated most recent copy scanned in chart (See row information)     Chief Complaint  Patient presents with   Medical Management of Chronic Issues    Patient presents today for a 4 month follow up with labs.     HPI: Patient is a 81 y.o. male seen today for medical management of chronic diseases.    Hypertension Doing well with low dose of Norvasc  Barrett's Esophagus S/p Ablation H/o Belching H/o Iron Def Anemia HGB normal now Prediabetes. HGA1C less then 7 Symptoms of BPH Gets up 2 night and Goes 7-8 times during day Does not want to see Urology right now or try any Meds  No Other issue today He wanted to talk about his wife who he thinks is having decline due to her PD Past Medical History:  Diagnosis Date   Allergy    seasonal   Arthritis    knees   Barrett's esophagus    Cataract    BILATERAL REMOVED   Gastric polyp    GERD (gastroesophageal reflux disease)    Glaucoma    Hemorrhoids    Hyperlipidemia    Hypertension    Pneumonia    as teenager   RBBB    RBBB    Vasovagal syncope    last episode was 2015-16; none since   Wears glasses     Past Surgical History:  Procedure Laterality Date   BIOPSY  01/16/2020   Procedure: BIOPSY;  Surgeon: Irving Copas., MD;  Location: [facility] ENDOSCOPY;  Service: Gastroenterology;;   CARDIAC CATHETERIZATION  1993   CATARACT EXTRACTION [place], BILATERAL  2021   COLONOSCOPY  2002; 2007   ENDOSCOPIC MUCOSAL RESECTION N/A 01/16/2020   Procedure: ENDOSCOPIC MUCOSAL RESECTION;   Surgeon: Irving Copas., MD;  Location: [facility] ENDOSCOPY;  Service: Gastroenterology;  Laterality: N/A;   ENDOSCOPIC MUCOSAL RESECTION N/A 02/03/2022   Procedure: ENDOSCOPIC MUCOSAL RESECTION;  Surgeon: Rush Landmark Telford Nab., MD;  Location: [facility] ENDOSCOPY;  Service: Gastroenterology;  Laterality: N/A;   ESOPHAGOGASTRODUODENOSCOPY (EGD) WITH PROPOFOL N/A 01/16/2020   Procedure: ESOPHAGOGASTRODUODENOSCOPY (EGD) WITH PROPOFOL;  Surgeon: Rush Landmark Telford Nab., MD;  Location: [place];  Service: Gastroenterology;  Laterality: N/A;   ESOPHAGOGASTRODUODENOSCOPY (EGD) WITH PROPOFOL N/A 02/03/2022   Procedure: ESOPHAGOGASTRODUODENOSCOPY (EGD) WITH PROPOFOL;  Surgeon: Rush Landmark Telford Nab., MD;  Location: [facility] ENDOSCOPY;  Service: Gastroenterology;  Laterality: N/A;   GI RADIOFREQUENCY ABLATION  02/03/2022   Procedure: GI RADIOFREQUENCY ABLATION;  Surgeon: Rush Landmark Telford Nab., MD;  Location: [facility] ENDOSCOPY;  Service: Gastroenterology;;   HEMOSTASIS CLIP PLACEMENT  01/16/2020   Procedure: HEMOSTASIS CLIP PLACEMENT;  Surgeon: Irving Copas., MD;  Location: [place];  Service: Gastroenterology;;   HEMOSTASIS CLIP PLACEMENT  02/03/2022   Procedure: HEMOSTASIS CLIP PLACEMENT;  Surgeon: Irving Copas., MD;  Location: [facility] ENDOSCOPY;  Service: Gastroenterology;;   HEMOSTASIS CONTROL  01/16/2020   Procedure: HEMOSTASIS CONTROL;  Surgeon: Irving Copas., MD;  Location: [facility] ENDOSCOPY;  Service: Gastroenterology;;   POLYPECTOMY     POLYPECTOMY  02/03/2022   Procedure: POLYPECTOMY;  Surgeon: Rush Landmark Telford Nab., MD;  Location: [facility] ENDOSCOPY;  Service: Gastroenterology;;   Woodward Ku  02/03/2022   Procedure: Wood[place] Ku;  Surgeon: Mansouraty, Telford Nab., MD;  Location: [facility] ENDOSCOPY;  Service: Gastroenterology;;  injection of epinephrine into gastric polyp   SUBMUCOSAL LIFTING INJECTION  01/16/2020   Procedure: SUBMUCOSAL LIFTING INJECTION;  Surgeon: Irving Copas., MD;   Location: [place];  Service: Gastroenterology;;   WISDOM TOOTH EXTRACTION      Allergies  Allergen Reactions   Black Pepper [[place]] Other (See Comments)    bloating   Ciprofloxacin Other (See Comments)    Bleeding-blood in stool    Nsaids     History of bleeding    Outpatient Encounter Medications as of 04/02/2022  Medication Sig   amLODipine (NORVASC) 2.5 MG tablet Take 1 tablet (2.5 mg total) by mouth daily.   b complex vitamins tablet Take 1 tablet by mouth daily.   bimatoprost (LUMIGAN) 0.01 % SOLN Place 1 drop into both eyes at bedtime.   cetirizine (ZYRTEC) 10 MG tablet Take 10 mg by mouth daily as needed for allergies.    Cholecalciferol (VITAMIN D) 2000 UNITS CAPS Take 1 capsule (2,000 Units total) by mouth daily.   fluticasone (FLONASE) 50 MCG/ACT nasal spray Place 2 sprays into both nostrils daily as needed for allergies or rhinitis.   folic acid (FOLVITE) 330 MCG tablet Take 800 mcg by mouth daily.    Glucosamine-Chondroit-Vit C-Mn (GLUCOSAMINE CHONDR 1500 COMPLX PO) Take 2 tablets by mouth daily.    omeprazole (PRILOSEC) 40 MG capsule Take 1 capsule (40 mg total) by mouth 2 (two) times daily before a meal.   rosuvastatin (CRESTOR) 5 MG tablet TAKE 1 TABLET BY MOUTH DAILY   timolol (TIMOPTIC) 0.5 % ophthalmic solution Place 1 drop into both eyes in the morning.   vitamin B-12 (CYANOCOBALAMIN) 1000 MCG tablet Take 1,000 mcg by mouth daily.   [DISCONTINUED] acetaminophen-codeine 120-12 MG/5ML solution Take 5 mLs by mouth every 6 (six) hours as needed for moderate pain.   [DISCONTINUED] GI Cocktail (alum & mag hydroxide, lidocaine, dicyclomine) oral mixture Take 30 mLs by mouth 4 (four) times daily. Swish and swallow 30 ml   [DISCONTINUED] sucralfate (CARAFATE) 1 GM/10ML suspension Take 10 mLs (1 g total) by mouth 4 (four) times daily.   No facility-administered encounter medications on file as of 04/02/2022.    Review of Systems:  Review of Systems  Constitutional:   Negative for activity change, appetite change and unexpected weight change.  HENT: Negative.    Respiratory:  Negative for cough and shortness of breath.   Cardiovascular:  Negative for leg swelling.  Gastrointestinal:  Negative for constipation.  Genitourinary:  Negative for frequency.  Musculoskeletal:  Negative for arthralgias, gait problem and myalgias.  Skin: Negative.  Negative for rash.  Neurological:  Negative for dizziness and weakness.  Psychiatric/Behavioral:  Negative for confusion and sleep disturbance.   All other systems reviewed and are negative.   Health Maintenance  Topic Date Due   INFLUENZA VACCINE  04/15/2022   TETANUS/TDAP  11/02/2030   Pneumonia Vaccine 63+ Years old  Completed   COVID-19 Vaccine  Completed   Zoster Vaccines- Shingrix  Completed   HPV VACCINES  Aged Out   COLONOSCOPY (Pts 45-40yr Insurance coverage will need to be confirmed)  Discontinued    Physical Exam: Vitals:   04/02/22 1017  BP: 138/88  Pulse: 76  Temp: (!) 97.3 F (36.3 C)  TempSrc: Temporal  Weight: 172 lb (78 kg)  Height: '5\' 7"'$  (1.702 m)   Body mass index is 26.94 kg/m. Physical Exam Vitals reviewed.  Constitutional:      Appearance: Normal appearance.  HENT:     Head: Normocephalic.     Right Ear: Tympanic membrane normal.     Left Ear: Tympanic membrane normal.     Nose: Nose normal.     Mouth/Throat:     Mouth: Mucous membranes are moist.     Pharynx: Oropharynx is clear.  Eyes:     Pupils: Pupils are equal, round, and reactive to light.  Cardiovascular:     Rate and Rhythm: Normal rate and regular rhythm.     Pulses: Normal pulses.     Heart sounds: No murmur heard. Pulmonary:     Effort: Pulmonary effort is normal. No respiratory distress.     Breath sounds: Normal breath sounds. No rales.  Abdominal:     General: Abdomen is flat. Bowel sounds are normal.     Palpations: Abdomen is soft.  Musculoskeletal:     Cervical back: Neck supple.     Comments:  Mild swelling Bilateral  Skin:    General: Skin is warm.  Neurological:     General: No focal deficit present.     Mental Status: He is alert and oriented to person, place, and time.  Psychiatric:        Mood and Affect: Mood normal.        Thought Content: Thought content normal.     Labs reviewed: Basic Metabolic Panel: Recent Labs    08/01/21 0000 03/20/22 0000  NA 144 139  K 4.5 4.1  CL 103 102  CO2 26* 29*  BUN 11 10  CREATININE 1.0 1.0  CALCIUM 9.3 9.1   Liver Function Tests: No results for input(s): "AST", "ALT", "ALKPHOS", "BILITOT", "PROT", "ALBUMIN" in the last 8760 hours. No results for input(s): "LIPASE", "AMYLASE" in the last 8760 hours. No results for input(s): "AMMONIA" in the last 8760 hours. CBC: Recent Labs    08/01/21 0000 01/07/22 1003 03/20/22 0000  WBC 7.1 7.6 5.9  HGB 15.4 15.3 14.7  HCT 44 44.5 43  MCV  --  92.1  --   PLT 238 228.0 229   Lipid Panel: Recent Labs    03/20/22 0000  CHOL 151  HDL 49  LDLCALC 73  TRIG 150   Lab Results  Component Value Date   HGBA1C 6.1 03/20/2022    Procedures since last visit: No results found.  Assessment/Plan 1. Barrett's esophagus with dysplasia S/p Ablation Follows with GI HGB normal 2. Gastroesophageal reflux disease, unspecified whether esophagitis present On Prilosec  3. Essential hypertension Low dose of Norvasc  4. Mixed hyperlipidemia Continue Crestor LDL good levels  5. Bilateral leg edema Mild Elevate Cut back salt If continues have to change Norvasc 6 Hyperglycemia   Sees Ophthalmology  on regular basis  Labs/tests ordered:  Labs before next visit Next appt:  07/15/2022

## 2022-05-12 ENCOUNTER — Non-Acute Institutional Stay: Payer: Medicare Other | Admitting: Adult Health

## 2022-05-12 ENCOUNTER — Encounter: Payer: Self-pay | Admitting: Adult Health

## 2022-05-12 VITALS — BP 142/73 | Temp 97.7°F | Ht 67.0 in | Wt 176.3 lb

## 2022-05-12 DIAGNOSIS — M25472 Effusion, left ankle: Secondary | ICD-10-CM

## 2022-05-12 MED ORDER — CEPHALEXIN 500 MG PO CAPS: 500.0000 mg | ORAL_CAPSULE | Freq: Three times a day (TID) | ORAL | 0 refills | Status: AC

## 2022-05-12 NOTE — Progress Notes (Signed)
Location:  [facility]  POS: Clinic  Provider: Royal Hawthorn, ANP  Code Status:  Goals of Care:     05/12/2022    2:41 PM  Advanced Directives  Does Patient Have a Medical Advance Directive? Yes  Type of Advance Directive [place]  Does patient want to make changes to medical advance directive? No - Patient declined     Chief Complaint  Patient presents with   Medical Management of Chronic Issues    Left ankle pain, wound it is swollen. Warm to touch.    HPI: Patient is a 81 y.o. male seen today for complaints of heel pain.  He has had pain in the left heel and ankle area for 3 weeks. He has tried ice and elevation and voltaren gel. He has some mild swelling and warmth to the ankle as well. Has a hx of gout but the area is not very tender. He says he had shoes that rubbed a wound on the left achilles area then the wound healed and was followed by pain and swelling.   Normal ABI to BLE in 2013 Past Medical History:  Diagnosis Date   Allergy    seasonal   Arthritis    knees   Barrett's esophagus    Cataract    BILATERAL REMOVED   Gastric polyp    GERD (gastroesophageal reflux disease)    Glaucoma    Hemorrhoids    Hyperlipidemia    Hypertension    Pneumonia    as teenager   RBBB    RBBB    Vasovagal syncope    last episode was 2015-16; none since   Wears glasses     Past Surgical History:  Procedure Laterality Date   BIOPSY  01/16/2020   Procedure: BIOPSY;  Surgeon: Irving Copas., MD;  Location: [facility] ENDOSCOPY;  Service: Gastroenterology;;   [place], BILATERAL  2021   COLONOSCOPY  2002; 2007   ENDOSCOPIC MUCOSAL RESECTION N/A 01/16/2020   Procedure: ENDOSCOPIC MUCOSAL RESECTION;  Surgeon: Irving Copas., MD;  Location: [place];  Service: Gastroenterology;  Laterality: N/A;   ENDOSCOPIC MUCOSAL RESECTION N/A 02/03/2022   Procedure: ENDOSCOPIC  MUCOSAL RESECTION;  Surgeon: Rush Landmark Telford Nab., MD;  Location: [facility] ENDOSCOPY;  Service: Gastroenterology;  Laterality: N/A;   ESOPHAGOGASTRODUODENOSCOPY (EGD) WITH PROPOFOL N/A 01/16/2020   Procedure: ESOPHAGOGASTRODUODENOSCOPY (EGD) WITH PROPOFOL;  Surgeon: Rush Landmark Telford Nab., MD;  Location: [place];  Service: Gastroenterology;  Laterality: N/A;   ESOPHAGOGASTRODUODENOSCOPY (EGD) WITH PROPOFOL N/A 02/03/2022   Procedure: ESOPHAGOGASTRODUODENOSCOPY (EGD) WITH PROPOFOL;  Surgeon: Rush Landmark Telford Nab., MD;  Location: [facility] ENDOSCOPY;  Service: Gastroenterology;  Laterality: N/A;   GI RADIOFREQUENCY ABLATION  02/03/2022   Procedure: GI RADIOFREQUENCY ABLATION;  Surgeon: Rush Landmark Telford Nab., MD;  Location: [facility] ENDOSCOPY;  Service: Gastroenterology;;   HEMOSTASIS CLIP PLACEMENT  01/16/2020   Procedure: HEMOSTASIS CLIP PLACEMENT;  Surgeon: Irving Copas., MD;  Location: [place];  Service: Gastroenterology;;   HEMOSTASIS CLIP PLACEMENT  02/03/2022   Procedure: HEMOSTASIS CLIP PLACEMENT;  Surgeon: Irving Copas., MD;  Location: [facility] ENDOSCOPY;  Service: Gastroenterology;;   HEMOSTASIS CONTROL  01/16/2020   Procedure: HEMOSTASIS CONTROL;  Surgeon: Irving Copas., MD;  Location: [place];  Service: Gastroenterology;;   POLYPECTOMY     POLYPECTOMY  02/03/2022   Procedure: POLYPECTOMY;  Surgeon: Irving Copas., MD;  Location: [facility] ENDOSCOPY;  Service: Gastroenterology;;   [place] Ku  02/03/2022  Procedure: SCHLEROTHERAPY;  Surgeon: Mansouraty, Telford Nab., MD;  Location: [facility] ENDOSCOPY;  Service: Gastroenterology;;  injection of epinephrine into gastric polyp   SUBMUCOSAL LIFTING INJECTION  01/16/2020   Procedure: SUBMUCOSAL LIFTING INJECTION;  Surgeon: Irving Copas., MD;  Location: [place];  Service: Gastroenterology;;   WISDOM TOOTH EXTRACTION      Allergies  Allergen Reactions   Black Pepper [[place]] Other (See Comments)    bloating    Ciprofloxacin Other (See Comments)    Bleeding-blood in stool    Nsaids     History of bleeding    Outpatient Encounter Medications as of 05/12/2022  Medication Sig   amLODipine (NORVASC) 2.5 MG tablet Take 1 tablet (2.5 mg total) by mouth daily.   b complex vitamins tablet Take 1 tablet by mouth daily.   bimatoprost (LUMIGAN) 0.01 % SOLN Place 1 drop into both eyes at bedtime.   cephALEXin (KEFLEX) 500 MG capsule Take 1 capsule (500 mg total) by mouth 3 (three) times daily for 7 days.   cetirizine (ZYRTEC) 10 MG tablet Take 10 mg by mouth daily as needed for allergies.    Cholecalciferol (VITAMIN D) 2000 UNITS CAPS Take 1 capsule (2,000 Units total) by mouth daily.   fluticasone (FLONASE) 50 MCG/ACT nasal spray Place 2 sprays into both nostrils daily as needed for allergies or rhinitis.   folic acid (FOLVITE) 962 MCG tablet Take 800 mcg by mouth daily.    Glucosamine-Chondroit-Vit C-Mn (GLUCOSAMINE CHONDR 1500 COMPLX PO) Take 2 tablets by mouth daily.    omeprazole (PRILOSEC) 40 MG capsule Take 1 capsule (40 mg total) by mouth 2 (two) times daily before a meal.   rosuvastatin (CRESTOR) 5 MG tablet TAKE 1 TABLET BY MOUTH DAILY   timolol (TIMOPTIC) 0.5 % ophthalmic solution Place 1 drop into both eyes in the morning.   vitamin B-12 (CYANOCOBALAMIN) 1000 MCG tablet Take 1,000 mcg by mouth daily.   No facility-administered encounter medications on file as of 05/12/2022.    Review of Systems:  Review of Systems  Constitutional:  Positive for activity change. Negative for appetite change, diaphoresis, fatigue, fever and unexpected weight change.  Musculoskeletal:  Positive for gait problem and joint swelling. Negative for back pain.       Left ankle and heel pain    Health Maintenance  Topic Date Due   COVID-19 Vaccine (6 - Moderna risk series) 03/12/2022   INFLUENZA VACCINE  04/15/2022   TETANUS/TDAP  11/02/2030   Pneumonia Vaccine 67+ Years old  Completed   Zoster Vaccines- Shingrix   Completed   HPV VACCINES  Aged Out   COLONOSCOPY (Pts 45-61yr Insurance coverage will need to be confirmed)  Discontinued    Physical Exam: Vitals:   05/12/22 1435  BP: (!) 142/73  Temp: 97.7 F (36.5 C)  Weight: 176 lb 4.8 oz (80 kg)  Height: '5\' 7"'$  (1.702 m)   Body mass index is 27.61 kg/m. Physical Exam Vitals reviewed.  Constitutional:      Appearance: Normal appearance.  Musculoskeletal:        General: Swelling (left ankle with warmth) and tenderness (left achilles tendon) present. No deformity or signs of injury.  Skin:    General: Skin is warm and dry.     Findings: Erythema (toe to both feet and left shin area which is chronic) present.  Neurological:     Mental Status: He is alert and oriented to person, place, and time.     Labs reviewed: Basic Metabolic Panel: Recent  Labs    08/01/21 0000 03/20/22 0000  NA 144 139  K 4.5 4.1  CL 103 102  CO2 26* 29*  BUN 11 10  CREATININE 1.0 1.0  CALCIUM 9.3 9.1   Liver Function Tests: No results for input(s): "AST", "ALT", "ALKPHOS", "BILITOT", "PROT", "ALBUMIN" in the last 8760 hours. No results for input(s): "LIPASE", "AMYLASE" in the last 8760 hours. No results for input(s): "AMMONIA" in the last 8760 hours. CBC: Recent Labs    08/01/21 0000 01/07/22 1003 03/20/22 0000  WBC 7.1 7.6 5.9  HGB 15.4 15.3 14.7  HCT 44 44.5 43  MCV  --  92.1  --   PLT 238 228.0 229   Lipid Panel: Recent Labs    03/20/22 0000  CHOL 151  HDL 49  LDLCALC 73  TRIG 150   Lab Results  Component Value Date   HGBA1C 6.1 03/20/2022    Procedures since last visit: No results found.  Assessment/Plan  1. Left ankle swelling Possible cellulitis since this area originally had a wound.  If no improvement he can call back and I will try prednisone for inflammation.  - cephALEXin (KEFLEX) 500 MG capsule; Take 1 capsule (500 mg total) by mouth 3 (three) times daily for 7 days.  Dispense: 21 capsule; Refill: 0   Labs/tests  ordered:  * No order type specified *NA Next appt:  07/15/2022   Total time 71mn:  time greater than 50% of total time spent doing pt counseling and coordination of care

## 2022-05-21 ENCOUNTER — Telehealth: Payer: Self-pay | Admitting: Adult Health

## 2022-05-21 DIAGNOSIS — M25472 Effusion, left ankle: Secondary | ICD-10-CM

## 2022-05-21 MED ORDER — PREDNISONE 20 MG PO TABS
20.0000 mg | ORAL_TABLET | Freq: Two times a day (BID) | ORAL | 0 refills | Status: AC
Start: 2022-05-21 — End: 2022-05-26

## 2022-05-21 NOTE — Telephone Encounter (Signed)
Still having some ankle swelling and pain. ? Gout vs inflammation/arthritis. Will add prednisone. Pt should follow up if no improving.

## 2022-05-27 ENCOUNTER — Telehealth: Payer: Self-pay | Admitting: *Deleted

## 2022-05-27 DIAGNOSIS — M7662 Achilles tendinitis, left leg: Secondary | ICD-10-CM

## 2022-05-27 NOTE — Telephone Encounter (Signed)
I have talked to the patient and Made a Referal to [facility]

## 2022-05-27 NOTE — Telephone Encounter (Signed)
Patient called and stated that his Achilles Tendon Injury is no better. Still limps and having pain after taking Antibiotics and Prednisone.  Finished Prednisone yesterday. Better, but when he starts walking around it starts hurting.  Stated that he likes to walk and would like to know what is next.   Patient is requesting to speak with you directly.   Please Advise.

## 2022-05-28 DIAGNOSIS — M79672 Pain in left foot: Secondary | ICD-10-CM | POA: Diagnosis not present

## 2022-07-10 DIAGNOSIS — H401112 Primary open-angle glaucoma, right eye, moderate stage: Secondary | ICD-10-CM | POA: Diagnosis not present

## 2022-07-10 DIAGNOSIS — H401123 Primary open-angle glaucoma, left eye, severe stage: Secondary | ICD-10-CM | POA: Diagnosis not present

## 2022-07-14 NOTE — Progress Notes (Unsigned)
Subjective:   Ian Rowe is a 81 y.o. male who presents for Medicare Annual/Subsequent preventive examination.  Review of Systems     Cardiac Risk Factors include: family history of premature cardiovascular disease;male gender;dyslipidemia;advanced age (>17mn, >>44women)     Objective:    There were no vitals filed for this visit. There is no height or weight on file to calculate BMI.     07/15/2022    9:13 AM 05/12/2022    2:41 PM 04/02/2022    9:05 AM 02/03/2022    7:54 AM 11/18/2021    2:33 PM 08/28/2021    8:53 AM 08/05/2021    3:39 PM  Advanced Directives  Does Patient Have a Medical Advance Directive? Yes Yes Yes Yes Yes Yes Yes  Type of AArts administratorPower of [place]Living will Healthcare Power of [place] Does patient want to make changes to medical advance directive? No - Patient declined No - Patient declined No - Patient declined  No - Patient declined No - Patient declined No - Patient declined  Copy of [place] Chart? Yes - validated most recent copy scanned in chart (See row information)  Yes - validated most recent copy scanned in chart (See row information)  No - copy requested Yes - validated most recent copy scanned in chart (See row information) Yes - validated most recent copy scanned in chart (See row information)    Current Medications (verified) Outpatient Encounter Medications as of 07/15/2022  Medication Sig   amLODipine (NORVASC) 2.5 MG tablet Take 1 tablet (2.5 mg total) by mouth daily.   b complex vitamins tablet Take 1 tablet by mouth daily.   bimatoprost (LUMIGAN) 0.01 % SOLN Place 1 drop into both eyes at bedtime.   cetirizine (ZYRTEC) 10 MG tablet Take 10 mg by mouth daily as needed for allergies.    Cholecalciferol (VITAMIN D) 2000 UNITS CAPS Take 1 capsule  (2,000 Units total) by mouth daily.   fluticasone (FLONASE) 50 MCG/ACT nasal spray Place 2 sprays into both nostrils daily as needed for allergies or rhinitis.   folic acid (FOLVITE) 8604MCG tablet Take 800 mcg by mouth daily.    Glucosamine-Chondroit-Vit C-Mn (GLUCOSAMINE CHONDR 1500 COMPLX PO) Take 2 tablets by mouth daily.    omeprazole (PRILOSEC) 40 MG capsule Take 1 capsule (40 mg total) by mouth 2 (two) times daily before a meal.   rosuvastatin (CRESTOR) 5 MG tablet TAKE 1 TABLET BY MOUTH DAILY   timolol (TIMOPTIC) 0.5 % ophthalmic solution Place 1 drop into both eyes in the morning.   vitamin B-12 (CYANOCOBALAMIN) 1000 MCG tablet Take 1,000 mcg by mouth daily.   No facility-administered encounter medications on file as of 07/15/2022.    Allergies (verified) Black pepper [piper], Ciprofloxacin, and Nsaids   History: Past Medical History:  Diagnosis Date   Allergy    seasonal   Arthritis    knees   Barrett's esophagus    Cataract    BILATERAL REMOVED   Gastric polyp    GERD (gastroesophageal reflux disease)    Glaucoma    Hemorrhoids    Hyperlipidemia    Hypertension    Pneumonia    as teenager   RBBB    RBBB    Vasovagal syncope    last episode was 2015-16; none since   Wears glasses  Past Surgical History:  Procedure Laterality Date   BIOPSY  01/16/2020   Procedure: BIOPSY;  Surgeon: Rush Landmark Telford Nab., MD;  Location: [facility] ENDOSCOPY;  Service: Gastroenterology;;   CARDIAC CATHETERIZATION  1993   CATARACT EXTRACTION [place], BILATERAL  2021   COLONOSCOPY  2002; 2007   ENDOSCOPIC MUCOSAL RESECTION N/A 01/16/2020   Procedure: ENDOSCOPIC MUCOSAL RESECTION;  Surgeon: Irving Copas., MD;  Location: [place];  Service: Gastroenterology;  Laterality: N/A;   ENDOSCOPIC MUCOSAL RESECTION N/A 02/03/2022   Procedure: ENDOSCOPIC MUCOSAL RESECTION;  Surgeon: Rush Landmark Telford Nab., MD;  Location: [facility] ENDOSCOPY;  Service: Gastroenterology;   Laterality: N/A;   ESOPHAGOGASTRODUODENOSCOPY (EGD) WITH PROPOFOL N/A 01/16/2020   Procedure: ESOPHAGOGASTRODUODENOSCOPY (EGD) WITH PROPOFOL;  Surgeon: Rush Landmark Telford Nab., MD;  Location: [place];  Service: Gastroenterology;  Laterality: N/A;   ESOPHAGOGASTRODUODENOSCOPY (EGD) WITH PROPOFOL N/A 02/03/2022   Procedure: ESOPHAGOGASTRODUODENOSCOPY (EGD) WITH PROPOFOL;  Surgeon: Rush Landmark Telford Nab., MD;  Location: [facility] ENDOSCOPY;  Service: Gastroenterology;  Laterality: N/A;   GI RADIOFREQUENCY ABLATION  02/03/2022   Procedure: GI RADIOFREQUENCY ABLATION;  Surgeon: Rush Landmark Telford Nab., MD;  Location: [facility] ENDOSCOPY;  Service: Gastroenterology;;   HEMOSTASIS CLIP PLACEMENT  01/16/2020   Procedure: HEMOSTASIS CLIP PLACEMENT;  Surgeon: Irving Copas., MD;  Location: [place];  Service: Gastroenterology;;   HEMOSTASIS CLIP PLACEMENT  02/03/2022   Procedure: HEMOSTASIS CLIP PLACEMENT;  Surgeon: Irving Copas., MD;  Location: [facility] ENDOSCOPY;  Service: Gastroenterology;;   HEMOSTASIS CONTROL  01/16/2020   Procedure: HEMOSTASIS CONTROL;  Surgeon: Irving Copas., MD;  Location: [place];  Service: Gastroenterology;;   POLYPECTOMY     POLYPECTOMY  02/03/2022   Procedure: POLYPECTOMY;  Surgeon: Irving Copas., MD;  Location: [facility] ENDOSCOPY;  Service: Gastroenterology;;   [place] Ku  02/03/2022   Procedure: [place] Ku;  Surgeon: Mansouraty, Telford Nab., MD;  Location: [facility] ENDOSCOPY;  Service: Gastroenterology;;  injection of epinephrine into gastric polyp   SUBMUCOSAL LIFTING INJECTION  01/16/2020   Procedure: SUBMUCOSAL LIFTING INJECTION;  Surgeon: Irving Copas., MD;  Location: [facility] ENDOSCOPY;  Service: Gastroenterology;;   WISDOM TOOTH EXTRACTION     Family History  Problem Relation Age of Onset   Alzheimer's disease Mother    Heart disease Father    Colon cancer Neg Hx    Colon polyps Neg Hx    Esophageal cancer Neg Hx    Stomach cancer Neg Hx     Rectal cancer Neg Hx    Inflammatory bowel disease Neg Hx    Liver disease Neg Hx    Pancreatic cancer Neg Hx    Social History   Socioeconomic History   Marital status: Married    Spouse name: Mardene Celeste   Number of children: 2   Years of education: Not on file   Highest education level: Not on file  Occupational History   Occupation: Retired Clinical biochemist  Tobacco Use   Smoking status: Never   Smokeless tobacco: Never  Vaping Use   Vaping Use: Never used  Substance and Sexual Activity   Alcohol use: Yes    Alcohol/week: 10.0 standard drinks of alcohol    Types: 7 Shots of liquor, 3 Glasses of wine per week    Comment: 2 scotch at bedtime nightly, occ glass of wine    Drug use: No   Sexual activity: Not on file  Other Topics Concern   Not on file  Social History Narrative   Moved to [place] 2014.   Married  Never smoked   Alcohol - scotch or wine nightly   Walks 150 minutes per week.    POA   Social Determinants of Health   Financial Resource Strain: Low Risk  (01/12/2018)   Overall Financial Resource Strain (CARDIA)    Difficulty of Paying Living Expenses: Not hard at all  Food Insecurity: No Food Insecurity (01/12/2018)   Hunger Vital Sign    Worried About Running Out of Food in the Last Year: Never true    Ran Out of Food in the Last Year: Never true  Transportation Needs: No Transportation Needs (01/12/2018)   PRAPARE - Hydrologist (Medical): No    Lack of Transportation (Non-Medical): No  Physical Activity: Sufficiently Active (01/12/2018)   Exercise Vital Sign    Days of Exercise per Week: 5 days    Minutes of Exercise per Session: 30 min  Stress: No Stress Concern Present (01/12/2018)   [place]    Feeling of Stress : Only a little  Social Connections: Moderately Integrated (01/12/2018)   Social Connection and Isolation Panel  [NHANES]    Frequency of Communication with Friends and Family: More than three times a week    Frequency of Social Gatherings with Friends and Family: More than three times a week    Attends Religious Services: Never    Marine scientist or Organizations: Yes    Attends Music therapist: More than 4 times per year    Marital Status: Married    Tobacco Counseling Counseling given: Not Answered   Clinical Intake:  Pre-visit preparation completed: Yes        BMI - recorded: 27 Nutritional Status: BMI 25 -29 Overweight Nutritional Risks: None Diabetes: No  How often do you need to have someone help you when you read instructions, pamphlets, or other written materials from your doctor or pharmacy?: 1 - Never  Diabetic?no         Activities of Daily Living    07/15/2022    9:56 AM  In your present state of health, do you have any difficulty performing the following activities:  Hearing? 0  Vision? 0  Difficulty concentrating or making decisions? 0  Walking or climbing stairs? 0  Dressing or bathing? 0  Doing errands, shopping? 0  Preparing Food and eating ? N  Using the Toilet? N  In the past six months, have you accidently leaked urine? N  Do you have problems with loss of bowel control? N  Managing your Medications? N  Managing your Finances? N  Housekeeping or managing your Housekeeping? N    Patient Care Team: Virgie Dad, MD as PCP - General (Internal Medicine) [facility] Armbruster, Carlota Raspberry, MD as Consulting Physician (Gastroenterology) Mansouraty, Telford Nab., MD as Consulting Physician (Gastroenterology)  Indicate any recent Medical Services you may have received from other than [facility] providers in the past year (date may be approximate).     Assessment:   This is a routine wellness examination for Khush.  Hearing/Vision screen Hearing Screening - Comments:: Patient has no hearing problems Vision  Screening - Comments:: Patient wears glasses. Patient had eye exam last week. Patient sees Dr. Ellie Lunch  Dietary issues and exercise activities discussed: Current Exercise Habits: Home exercise routine, Type of exercise: stretching;walking, Time (Minutes): 30, Frequency (Times/Week): 5, Weekly Exercise (Minutes/Week): 150   Goals Addressed   None    Depression Screen  07/15/2022    9:37 AM 04/02/2022   10:20 AM 07/11/2021   11:00 AM 07/10/2020    9:58 AM 04/11/2020    3:10 PM 07/27/2019    9:29 AM 07/07/2019   10:46 AM  PHQ 2/9 Scores  PHQ - 2 Score 0 0 0 0 0 0 0    Fall Risk    07/15/2022    9:38 AM 04/02/2022   10:20 AM 11/18/2021    2:32 PM 08/28/2021    8:53 AM 08/05/2021    3:39 PM  [place] in the past year? 0 0 0 0 0  Number falls in past yr: 0 0 0 0 0  Injury with Fall? 0 0 0 0 0  Risk for fall due to : No Fall Risks No Fall Risks No Fall Risks No Fall Risks No Fall Risks  Follow up Falls evaluation completed Falls evaluation completed Falls evaluation completed Falls evaluation completed Falls evaluation completed    FALL RISK PREVENTION PERTAINING TO THE HOME:  Any stairs in or around the home? No  If so, are there any without handrails? No  Home free of loose throw rugs in walkways, pet beds, electrical cords, etc? Yes  Adequate lighting in your home to reduce risk of falls? Yes   ASSISTIVE DEVICES UTILIZED TO PREVENT FALLS:  Life alert? No  Use of a cane, walker or w/c? No  Grab bars in the bathroom? Yes  Shower chair or bench in shower? Yes  Elevated toilet seat or a handicapped toilet? No   TIMED UP AND GO:  Was the test performed? No .    Cognitive Function:    01/12/2018   10:14 AM 12/31/2016   10:20 AM 06/27/2015   10:27 AM  MMSE - Mini Mental State Exam  Orientation to time '5 5 5  '$ Orientation to Place '5 5 5  '$ Registration '3 3 3  '$ Attention/ Calculation '5 5 5  '$ Recall '3 3 3  '$ Language- name 2 objects '2 2 2  '$ Language- repeat '1 1 1   '$ Language- follow 3 step command '3 3 3  '$ Language- read & follow direction '1 1 1  '$ Write a sentence '1 1 1  '$ Copy design '1 1 1  '$ Total score '30 30 30        '$ 07/15/2022    9:38 AM 07/11/2021   11:04 AM 07/10/2020   10:00 AM 07/07/2019   10:48 AM  6CIT Screen  What Year? 0 points 0 points 0 points 0 points  What month? 0 points 0 points 0 points 0 points  What time? 0 points 0 points 0 points 0 points  Count back from 20 0 points 0 points 0 points 0 points  Months in reverse 0 points 0 points 0 points 0 points  Repeat phrase 0 points 2 points 0 points 0 points  Total Score 0 points 2 points 0 points 0 points    Immunizations Immunization History  Administered Date(s) Administered   Fluad Quad(high Dose 65+) 06/20/2021   Influenza Whole 06/15/2012, 06/15/2013   Influenza, High Dose Seasonal PF 06/19/2016, 07/13/2020, 06/19/2022   Influenza, Quadrivalent, Recombinant, Inj, Pf 06/16/2019   Influenza,inj,Quad PF,6+ Mos 06/15/2018   Influenza-Unspecified 06/14/2014, 06/21/2015, 06/30/2017, 06/28/2020   Moderna Covid-19 Vaccine Bivalent Booster 15yr & up 01/15/2022   Moderna SARS-COV2 Booster Vaccination 01/28/2021   Moderna Sars-Covid-2 Vaccination 09/27/2019, 10/26/2019, 07/31/2020, 06/28/2021   Pneumococcal Conjugate-13 10/31/2014   Pneumococcal Polysaccharide-23 09/16/2011   Tdap 12/08/2013, 11/02/2020  Unspecified SARS-COV-2 Vaccination 06/28/2021, 06/19/2022   Zoster Recombinat (Shingrix) 08/25/2017, 11/02/2017    TDAP status: Up to date  Flu Vaccine status: Due, Education has been provided regarding the importance of this vaccine. Advised may receive this vaccine at local pharmacy or Health Dept. Aware to provide a copy of the vaccination record if obtained from local pharmacy or Health Dept. Verbalized acceptance and understanding.  Pneumococcal vaccine status: Up to date  Covid-19 vaccine status: Information provided on how to obtain vaccines.   Qualifies for Shingles  Vaccine? Yes   Zostavax completed No   Shingrix Completed?: Yes  Screening Tests Health Maintenance  Topic Date Due   COVID-19 Vaccine (7 - Moderna risk series) 08/14/2022   Medicare Annual Wellness (AWV)  07/16/2023   TETANUS/TDAP  11/02/2030   Pneumonia Vaccine 70+ Years old  Completed   INFLUENZA VACCINE  Completed   Zoster Vaccines- Shingrix  Completed   HPV VACCINES  Aged Out   COLONOSCOPY (Pts 45-56yr Insurance coverage will need to be confirmed)  Discontinued    Health Maintenance  There are no preventive care reminders to display for this patient.   Colorectal cancer screening: No longer required.   Lung Cancer Screening: (Low Dose CT Chest recommended if Age 83-80years, 30 pack-year currently smoking OR have quit w/in 15years.) does not qualify.   Lung Cancer Screening Referral: na  Additional Screening:  Hepatitis C Screening: does not qualify  Vision Screening: Recommended annual ophthalmology exams for early detection of glaucoma and other disorders of the eye. Is the patient up to date with their annual eye exam?  Yes  Who is the provider or what is the name of the office in which the patient attends annual eye exams? [facility] If pt is not established with a provider, would they like to be referred to a provider to establish care? No .   Dental Screening: Recommended annual dental exams for proper oral hygiene  Community Resource Referral / Chronic Care Management: CRR required this visit?  No   CCM required this visit?  No      Plan:     I have personally reviewed and noted the following in the patient's chart:   Medical and social history Use of alcohol, tobacco or illicit drugs  Current medications and supplements including opioid prescriptions. Patient is not currently taking opioid prescriptions. Functional ability and status Nutritional status Physical activity Advanced directives List of other physicians Hospitalizations, surgeries, and  ER visits in previous 12 months Vitals Screenings to include cognitive, depression, and falls Referrals and appointments  In addition, I have reviewed and discussed with patient certain preventive protocols, quality metrics, and best practice recommendations. A written personalized care plan for preventive services as well as general preventive health recommendations were provided to patient.     JLauree Chandler NP   07/15/2022    Virtual Visit via Telephone Note  I connected with patient 07/15/22 at  9:40 AM EDT by telephone and verified that I am speaking with the correct person using two identifiers.  Location: Patient: home Provider: [facility]   I discussed the limitations, risks, security and privacy concerns of performing an evaluation and management service by telephone and the availability of in person appointments. I also discussed with the patient that there may be a patient responsible charge related to this service. The patient expressed understanding and agreed to proceed.   I discussed the assessment and treatment plan with the patient. The patient was provided an  opportunity to ask questions and all were answered. The patient agreed with the plan and demonstrated an understanding of the instructions.   The patient was advised to call back or seek an in-person evaluation if the symptoms worsen or if the condition fails to improve as anticipated.  I provided 15 minutes of non-face-to-face time during this encounter.  Carlos American. Harle Battiest Avs printed and mailed

## 2022-07-14 NOTE — Patient Instructions (Signed)
Ian Rowe , Thank you for taking time to come for your Medicare Wellness Visit. I appreciate your ongoing commitment to your health goals. Please review the following plan we discussed and let me know if I can assist you in the future.   Screening recommendations/referrals: Colonoscopy aged out Recommended yearly ophthalmology/optometry visit for glaucoma screening and checkup Recommended yearly dental visit for hygiene and checkup  Vaccinations: Influenza vaccine due annually in September/October Pneumococcal vaccine up to date Tdap vaccine up to date Shingles vaccine up to date    Advanced directives: on file  Conditions/risks identified: advanced age,  Next appointment: yearly- next year in person.   Preventive Care 83 Years and Older, Male Preventive care refers to lifestyle choices and visits with your health care provider that can promote health and wellness. What does preventive care include? A yearly physical exam. This is also called an annual well check. Dental exams once or twice a year. Routine eye exams. Ask your health care provider how often you should have your eyes checked. Personal lifestyle choices, including: Daily care of your teeth and gums. Regular physical activity. Eating a healthy diet. Avoiding tobacco and drug use. Limiting alcohol use. Practicing safe sex. Taking low doses of aspirin every day. Taking vitamin and mineral supplements as recommended by your health care provider. What happens during an annual well check? The services and screenings done by your health care provider during your annual well check will depend on your age, overall health, lifestyle risk factors, and family history of disease. Counseling  Your health care provider may ask you questions about your: Alcohol use. Tobacco use. Drug use. Emotional well-being. Home and relationship well-being. Sexual activity. Eating habits. History of falls. Memory and ability to  understand (cognition). Work and work Statistician. Screening  You may have the following tests or measurements: Height, weight, and BMI. Blood pressure. Lipid and cholesterol levels. These may be checked every 5 years, or more frequently if you are over 71 years old. Skin check. Lung cancer screening. You may have this screening every year starting at age 83 if you have a 30-pack-year history of smoking and currently smoke or have quit within the past 15 years. Fecal occult blood test (FOBT) of the stool. You may have this test every year starting at age 83. Flexible sigmoidoscopy or colonoscopy. You may have a sigmoidoscopy every 5 years or a colonoscopy every 10 years starting at age 83. Prostate cancer screening. Recommendations will vary depending on your family history and other risks. Hepatitis C blood test. Hepatitis B blood test. Sexually transmitted disease (STD) testing. Diabetes screening. This is done by checking your blood sugar (glucose) after you have not eaten for a while (fasting). You may have this done every 1-3 years. Abdominal aortic aneurysm (AAA) screening. You may need this if you are a current or former smoker. Osteoporosis. You may be screened starting at age 58 if you are at high risk. Talk with your health care provider about your test results, treatment options, and if necessary, the need for more tests. Vaccines  Your health care provider may recommend certain vaccines, such as: Influenza vaccine. This is recommended every year. Tetanus, diphtheria, and acellular pertussis (Tdap, Td) vaccine. You may need a Td booster every 10 years. Zoster vaccine. You may need this after age 59. Pneumococcal 13-valent conjugate (PCV13) vaccine. One dose is recommended after age 83 Pneumococcal polysaccharide (PPSV23) vaccine. One dose is recommended after age 83 Talk to your health care provider about  which screenings and vaccines you need and how often you need them. This  information is not intended to replace advice given to you by your health care provider. Make sure you discuss any questions you have with your health care provider. Document Released: 09/28/2015 Document Revised: 05/21/2016 Document Reviewed: 07/03/2015 Elsevier Interactive Patient Education  2017 [employer] Prevention in the Home Falls can cause injuries. They can happen to people of all ages. There are many things you can do to make your home safe and to help prevent falls. What can I do on the outside of my home? Regularly fix the edges of walkways and driveways and fix any cracks. Remove anything that might make you trip as you walk through a door, such as a raised step or threshold. Trim any bushes or trees on the path to your home. Use bright outdoor lighting. Clear any walking paths of anything that might make someone trip, such as rocks or tools. Regularly check to see if handrails are loose or broken. Make sure that both sides of any steps have handrails. Any raised decks and porches should have guardrails on the edges. Have any leaves, snow, or ice cleared regularly. Use sand or salt on walking paths during winter. Clean up any spills in your garage right away. This includes oil or grease spills. What can I do in the bathroom? Use night lights. Install grab bars by the toilet and in the tub and shower. Do not use towel bars as grab bars. Use non-skid mats or decals in the tub or shower. If you need to sit down in the shower, use a plastic, non-slip stool. Keep the floor dry. Clean up any water that spills on the floor as soon as it happens. Remove soap buildup in the tub or shower regularly. Attach bath mats securely with double-sided non-slip rug tape. Do not have throw rugs and other things on the floor that can make you trip. What can I do in the bedroom? Use night lights. Make sure that you have a light by your bed that is easy to reach. Do not use any sheets or  blankets that are too big for your bed. They should not hang down onto the floor. Have a firm chair that has side arms. You can use this for support while you get dressed. Do not have throw rugs and other things on the floor that can make you trip. What can I do in the kitchen? Clean up any spills right away. Avoid walking on wet floors. Keep items that you use a lot in easy-to-reach places. If you need to reach something above you, use a strong step stool that has a grab bar. Keep electrical cords out of the way. Do not use floor polish or wax that makes floors slippery. If you must use wax, use non-skid floor wax. Do not have throw rugs and other things on the floor that can make you trip. What can I do with my stairs? Do not leave any items on the stairs. Make sure that there are handrails on both sides of the stairs and use them. Fix handrails that are broken or loose. Make sure that handrails are as long as the stairways. Check any carpeting to make sure that it is firmly attached to the stairs. Fix any carpet that is loose or worn. Avoid having throw rugs at the top or bottom of the stairs. If you do have throw rugs, attach them to the floor with  carpet tape. Make sure that you have a light switch at the top of the stairs and the bottom of the stairs. If you do not have them, ask someone to add them for you. What else can I do to help prevent falls? Wear shoes that: Do not have high heels. Have rubber bottoms. Are comfortable and fit you well. Are closed at the toe. Do not wear sandals. If you use a stepladder: Make sure that it is fully opened. Do not climb a closed stepladder. Make sure that both sides of the stepladder are locked into place. Ask someone to hold it for you, if possible. Clearly mark and make sure that you can see: Any grab bars or handrails. First and last steps. Where the edge of each step is. Use tools that help you move around (mobility aids) if they are  needed. These include: Canes. Walkers. Scooters. Crutches. Turn on the lights when you go into a dark area. Replace any light bulbs as soon as they burn out. Set up your furniture so you have a clear path. Avoid moving your furniture around. If any of your floors are uneven, fix them. If there are any pets around you, be aware of where they are. Review your medicines with your doctor. Some medicines can make you feel dizzy. This can increase your chance of falling. Ask your doctor what other things that you can do to help prevent falls. This information is not intended to replace advice given to you by your health care provider. Make sure you discuss any questions you have with your health care provider. Document Released: 06/28/2009 Document Revised: 02/07/2016 Document Reviewed: 10/06/2014 Elsevier Interactive Patient Education  2017 [employer].

## 2022-07-15 ENCOUNTER — Encounter: Payer: Self-pay | Admitting: Nurse Practitioner

## 2022-07-15 ENCOUNTER — Encounter: Payer: Medicare Other | Admitting: Nurse Practitioner

## 2022-07-15 ENCOUNTER — Telehealth: Payer: Self-pay

## 2022-07-15 ENCOUNTER — Ambulatory Visit (INDEPENDENT_AMBULATORY_CARE_PROVIDER_SITE_OTHER): Payer: Medicare Other | Admitting: Nurse Practitioner

## 2022-07-15 DIAGNOSIS — Z0000 Encounter for general adult medical examination without abnormal findings: Secondary | ICD-10-CM | POA: Diagnosis not present

## 2022-07-15 NOTE — Telephone Encounter (Signed)
Mr. akili, corsetti are scheduled for a virtual visit with your provider today.    Just as we do with appointments in the office, we must obtain your consent to participate.  Your consent will be active for this visit and any virtual visit you may have with one of our providers in the next 365 days.    If you have a MyChart account, I can also send a copy of this consent to you electronically.  All virtual visits are billed to your insurance company just like a traditional visit in the office.  As this is a virtual visit, video technology does not allow for your provider to perform a traditional examination.  This may limit your provider's ability to fully assess your condition.  If your provider identifies any concerns that need to be evaluated in person or the need to arrange testing such as labs, EKG, etc, we will make arrangements to do so.    Although advances in technology are sophisticated, we cannot ensure that it will always work on either your end or our end.  If the connection with a video visit is poor, we may have to switch to a telephone visit.  With either a video or telephone visit, we are not always able to ensure that we have a secure connection.   I need to obtain your verbal consent now.   Are you willing to proceed with your visit today?   Ian Rowe has provided verbal consent on 07/15/2022 for a virtual visit (video or telephone).   Carroll Kinds, CMA 07/15/2022  9:42 AM

## 2022-07-15 NOTE — Progress Notes (Signed)
This service is provided via telemedicine  No vital signs collected/recorded due to the encounter was a telemedicine visit.   Location of patient (ex: home, work):  Home  Patient consents to a telephone visit:  Yes, see encounter dated 07/15/2022  Location of the provider (ex: office, home):  [address]  Name of any referring provider:  Veleta Miners, MD  Names of all persons participating in the telemedicine service and their role in the encounter:  Sherrie Mustache, Nurse Practitioner, Carroll Kinds, CMA, and patient.   Time spent on call:  12 minutes with medical assistant

## 2022-07-21 ENCOUNTER — Non-Acute Institutional Stay: Payer: Medicare Other | Admitting: Adult Health

## 2022-07-21 ENCOUNTER — Encounter: Payer: Self-pay | Admitting: Adult Health

## 2022-07-21 VITALS — BP 140/72 | HR 83 | Temp 97.9°F | Resp 18 | Ht 67.0 in | Wt 173.4 lb

## 2022-07-21 DIAGNOSIS — M25472 Effusion, left ankle: Secondary | ICD-10-CM | POA: Diagnosis not present

## 2022-07-21 DIAGNOSIS — I10 Essential (primary) hypertension: Secondary | ICD-10-CM | POA: Diagnosis not present

## 2022-07-21 MED ORDER — COLCHICINE 0.6 MG PO TABS: 0.6000 mg | ORAL_TABLET | Freq: Every day | ORAL | 0 refills | Status: DC

## 2022-07-21 NOTE — Progress Notes (Addendum)
Location:  [facility]  POS: Clinic  Provider: Royal Hawthorn, ANP   Goals of Care:     07/21/2022    1:47 PM  Advanced Directives  Does Patient Have a Medical Advance Directive? Yes  Type of Paramedic of [place];Living will  Copy of [place] in Chart? Yes - validated most recent copy scanned in chart (See row information)     Chief Complaint  Patient presents with   Medical Management of Chronic Issues    Follow up on his achilles tendon and he would like to discuss more symptoms    HPI: Patient is a 81 y.o. male seen today for f/u regarding achilles tendon pain.   He was seen for left ankle pain and swelling 05/12/22. Prior to this he had tried voltaren with no benefit.  At that time he had a wound due to a shoe rubbing on the area that healed and later had swelling, warmth, and pain with ambulation. He was treated with keflex and things did not improve significantly. This was followed up with prednisone which helped to a degree. Later he was referred to ortho. 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  He reports the pain is mildly better but still has warmth and swelling. He reports he has had gout before.   Bp was also elevated initially I rechecked it and got 140/72   Past Medical History:  Diagnosis Date   Allergy    seasonal   Arthritis    knees   Barrett's esophagus    Cataract    BILATERAL REMOVED   Gastric polyp    GERD (gastroesophageal reflux disease)    Glaucoma    Hemorrhoids    Hyperlipidemia    Hypertension    Pneumonia    as teenager   RBBB    RBBB    Vasovagal syncope    last episode was 2015-16; none since   Wears glasses     Past Surgical History:  Procedure Laterality Date   BIOPSY  01/16/2020   Procedure: BIOPSY;  Surgeon: Irving Copas., MD;  Location: [facility] ENDOSCOPY;  Service: Gastroenterology;;   [place], BILATERAL  2021   COLONOSCOPY  2002; 2007   ENDOSCOPIC MUCOSAL RESECTION N/A 01/16/2020   Procedure: ENDOSCOPIC MUCOSAL RESECTION;  Surgeon: Irving Copas., MD;  Location: [place];  Service: Gastroenterology;  Laterality: N/A;   ENDOSCOPIC MUCOSAL RESECTION N/A 02/03/2022   Procedure: ENDOSCOPIC MUCOSAL RESECTION;  Surgeon: Rush Landmark Telford Nab., MD;  Location: [facility] ENDOSCOPY;  Service: Gastroenterology;  Laterality: N/A;   ESOPHAGOGASTRODUODENOSCOPY (EGD) WITH PROPOFOL N/A 01/16/2020   Procedure: ESOPHAGOGASTRODUODENOSCOPY (EGD) WITH PROPOFOL;  Surgeon: Rush Landmark Telford Nab., MD;  Location: [place];  Service: Gastroenterology;  Laterality: N/A;   ESOPHAGOGASTRODUODENOSCOPY (EGD) WITH PROPOFOL N/A 02/03/2022   Procedure: ESOPHAGOGASTRODUODENOSCOPY (EGD) WITH PROPOFOL;  Surgeon: Rush Landmark Telford Nab., MD;  Location: [facility] ENDOSCOPY;  Service: Gastroenterology;  Laterality: N/A;   GI RADIOFREQUENCY ABLATION  02/03/2022   Procedure: GI RADIOFREQUENCY ABLATION;  Surgeon: Rush Landmark Telford Nab., MD;  Location: [facility]OS[facility];  Service: Gastroenterology;;   HEMOSTASIS CLIP PLACEMENT  01/16/2020   Procedure: HEMOSTASIS CLIP PLACEMENT;  Surgeon: Irving Copas., MD;  Location: [place];  Service: Gastroenterology;;   HEMOSTASIS CLIP PLACEMENT  02/03/2022   Procedure: HEMOSTASIS CLIP PLACEMENT;  Surgeon: Irving Copas., MD;  Location: [facility] ENDOSCOPY;  Service: Gastroenterology;;  HEMOSTASIS CONTROL  01/16/2020   Procedure: HEMOSTASIS CONTROL;  Surgeon: Rush Landmark Telford Nab., MD;  Location: [place];  Service: Gastroenterology;;   POLYPECTOMY     POLYPECTOMY  02/03/2022   Procedure: POLYPECTOMY;  Surgeon: Irving Copas., MD;  Location: [facility];  Service: Gastroenterology;;   Wood[place] Ku  02/03/2022   Procedure: [place] Ku;  Surgeon: Mansouraty, Telford Nab., MD;  Location: [facility] ENDOSCOPY;  Service:  Gastroenterology;;  injection of epinephrine into gastric polyp   SUBMUCOSAL LIFTING INJECTION  01/16/2020   Procedure: SUBMUCOSAL LIFTING INJECTION;  Surgeon: Irving Copas., MD;  Location: [place];  Service: Gastroenterology;;   WISDOM TOOTH EXTRACTION      Allergies  Allergen Reactions   Black Pepper [[place]] Other (See Comments)    bloating   Ciprofloxacin Other (See Comments)    Bleeding-blood in stool    Nsaids     History of bleeding    Outpatient Encounter Medications as of 07/21/2022  Medication Sig   amLODipine (NORVASC) 2.5 MG tablet Take 1 tablet (2.5 mg total) by mouth daily.   b complex vitamins tablet Take 1 tablet by mouth daily.   bimatoprost (LUMIGAN) 0.01 % SOLN Place 1 drop into both eyes at bedtime.   cetirizine (ZYRTEC) 10 MG tablet Take 10 mg by mouth daily as needed for allergies.    Cholecalciferol (VITAMIN D) 2000 UNITS CAPS Take 1 capsule (2,000 Units total) by mouth daily.   fluticasone (FLONASE) 50 MCG/ACT nasal spray Place 2 sprays into both nostrils daily as needed for allergies or rhinitis.   folic acid (FOLVITE) 852 MCG tablet Take 800 mcg by mouth daily.    Glucosamine-Chondroit-Vit C-Mn (GLUCOSAMINE CHONDR 1500 COMPLX PO) Take 2 tablets by mouth daily.    omeprazole (PRILOSEC) 40 MG capsule Take 1 capsule (40 mg total) by mouth 2 (two) times daily before a meal.   rosuvastatin (CRESTOR) 5 MG tablet TAKE 1 TABLET BY MOUTH DAILY   timolol (TIMOPTIC) 0.5 % ophthalmic solution Place 1 drop into both eyes in the morning.   vitamin B-12 (CYANOCOBALAMIN) 1000 MCG tablet Take 1,000 mcg by mouth daily.   No facility-administered encounter medications on file as of 07/21/2022.    Review of Systems:  Review of Systems  Constitutional:  Positive for activity change. Negative for appetite change, chills, diaphoresis, fatigue, fever and unexpected weight change.  Musculoskeletal:  Positive for arthralgias and joint swelling.  Skin:        warmth     Health Maintenance  Topic Date Due   COVID-19 Vaccine (7 - Moderna risk series) 08/14/2022   Medicare Annual Wellness (AWV)  07/16/2023   TETANUS/TDAP  11/02/2030   Pneumonia Vaccine 36+ Years old  Completed   INFLUENZA VACCINE  Completed   Zoster Vaccines- Shingrix  Completed   HPV VACCINES  Aged Out   COLONOSCOPY (Pts 45-26yr Insurance coverage will need to be confirmed)  Discontinued    Physical Exam: Vitals:   07/21/22 1344 07/21/22 1351  BP: (!) 158/82 (!) 140/72  Pulse: 83   Resp: 18   Temp: 97.9 F (36.6 C)   TempSrc: Temporal   SpO2: 97%   Weight: 173 lb 6.4 oz (78.7 kg)   Height: _0  (1.702 m)    Body mass index is 27.16 kg/m. Physical Exam Vitals and nursing note reviewed.  Musculoskeletal:        General: Swelling (left ankle) present. No tenderness, deformity or signs of injury.     Comments: Left achilles area with warmth  and swelling at the site.      Labs reviewed: Basic Metabolic Panel: Recent Labs    08/01/21 0000 03/20/22 0000  NA 144 139  K 4.5 4.1  CL 103 102  CO2 26* 29*  BUN 11 10  CREATININE 1.0 1.0  CALCIUM 9.3 9.1   Liver Function Tests: No results for input(s): "AST", "ALT", "ALKPHOS", "BILITOT", "PROT", "ALBUMIN" in the last 8760 hours. No results for input(s): "LIPASE", "AMYLASE" in the last 8760 hours. No results for input(s): "AMMONIA" in the last 8760 hours. CBC: Recent Labs    08/01/21 0000 01/07/22 1003 03/20/22 0000  WBC 7.1 7.6 5.9  HGB 15.4 15.3 14.7  HCT 44 44.5 43  MCV  --  92.1  --   PLT 238 228.0 229   Lipid Panel: Recent Labs    03/20/22 0000  CHOL 151  HDL 49  LDLCALC 73  TRIG 150   Lab Results  Component Value Date   HGBA1C 6.1 03/20/2022    Procedures since last visit: No results found.  Assessment/Plan  1. Left ankle swelling Pre rupture achilles syndrome per ortho, injection contraindicated.  Pt would like to try something else, does have hx of gout - colchicine 0.6 MG tablet;  Take 1 tablet (0.6 mg total) by mouth daily.  Dispense: 3 tablet; Refill: 0  2. HTN Bp improved at recheck Continue norvasc    Labs/tests ordered:  * No order type specified *ESR Uric acid.  Next appt:  2 weeks    Total time 30mn:  time greater than 50% of total time spent doing pt counseling and coordination of care

## 2022-07-31 DIAGNOSIS — M25472 Effusion, left ankle: Secondary | ICD-10-CM | POA: Diagnosis not present

## 2022-08-03 NOTE — Progress Notes (Unsigned)
Location:  [facility]  POS: Clinic  Provider: Royal Hawthorn, ANP   Goals of Care:     07/21/2022    1:47 PM  Advanced Directives  Does Patient Have a Medical Advance Directive? Yes  Type of Paramedic of [place];Living will  Copy of [place] in Chart? Yes - validated most recent copy scanned in chart (See row information)     Chief Complaint  Patient presents with   Medical Management of Chronic Issues    2 week follow up.    HPI: Patient is a 81 y.o. male seen today for f/u regarding achilles tendon pain.   He was seen for left ankle pain and swelling 05/12/22. Prior to this he had tried voltaren with no benefit.  At that time he had a wound due to a shoe rubbing on the area that healed and later had swelling, warmth, and pain with ambulation. He was treated with keflex and things did not improve significantly. This was followed up with prednisone which helped to a degree. Later he was referred to ortho. 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 He was seen again 07/21/22 with no improvement and request to try something different. He did have a hx of gout. Colchicine prescribed.  ESR CRP He reports the pain is mildly better but still has warmth and swelling. He reports he has had gout before.      Past Medical History:  Diagnosis Date   Allergy    seasonal   Arthritis    knees   Barrett's esophagus    Cataract    BILATERAL REMOVED   Gastric polyp    GERD (gastroesophageal reflux disease)    Glaucoma    Hemorrhoids    Hyperlipidemia    Hypertension    Pneumonia    as teenager   RBBB    RBBB    Vasovagal syncope    last episode was 2015-16; none since   Wears glasses     Past Surgical History:  Procedure Laterality Date   BIOPSY  01/16/2020   Procedure: BIOPSY;  Surgeon: Irving Copas., MD;  Location: [facility] ENDOSCOPY;  Service: Gastroenterology;;   [place], BILATERAL  2021   COLONOSCOPY  2002; 2007   ENDOSCOPIC MUCOSAL RESECTION N/A 01/16/2020   Procedure: ENDOSCOPIC MUCOSAL RESECTION;  Surgeon: Irving Copas., MD;  Location: [place];  Service: Gastroenterology;  Laterality: N/A;   ENDOSCOPIC MUCOSAL RESECTION N/A 02/03/2022   Procedure: ENDOSCOPIC MUCOSAL RESECTION;  Surgeon: Rush Landmark Telford Nab., MD;  Location: [facility] ENDOSCOPY;  Service: Gastroenterology;  Laterality: N/A;   ESOPHAGOGASTRODUODENOSCOPY (EGD) WITH PROPOFOL N/A 01/16/2020   Procedure: ESOPHAGOGASTRODUODENOSCOPY (EGD) WITH PROPOFOL;  Surgeon: Rush Landmark Telford Nab., MD;  Location: [place];  Service: Gastroenterology;  Laterality: N/A;   ESOPHAGOGASTRODUODENOSCOPY (EGD) WITH PROPOFOL N/A 02/03/2022   Procedure: ESOPHAGOGASTRODUODENOSCOPY (EGD) WITH PROPOFOL;  Surgeon: Rush Landmark Telford Nab., MD;  Location: [facility] ENDOSCOPY;  Service: Gastroenterology;  Laterality: N/A;   GI RADIOFREQUENCY ABLATION  02/03/2022   Procedure: GI RADIOFREQUENCY ABLATION;  Surgeon: Rush Landmark Telford Nab., MD;  Location: [facility] ENDOSCOPY;  Service: Gastroenterology;;   HEMOSTASIS CLIP PLACEMENT  01/16/2020   Procedure: HEMOSTASIS CLIP PLACEMENT;  Surgeon: Irving Copas., MD;  Location: [place];  Service: Gastroenterology;;   HEMOSTASIS CLIP PLACEMENT  02/03/2022   Procedure: HEMOSTASIS CLIP PLACEMENT;  Surgeon: Irving Copas., MD;  Location: [facility]  ENDOSCOPY;  Service: Gastroenterology;;   HEMOSTASIS CONTROL  01/16/2020   Procedure: HEMOSTASIS CONTROL;  Surgeon: Irving Copas., MD;  Location: [place];  Service: Gastroenterology;;   POLYPECTOMY     POLYPECTOMY  02/03/2022   Procedure: POLYPECTOMY;  Surgeon: Irving Copas., MD;  Location: [facility] ENDOSCOPY;  Service: Gastroenterology;;   [place] Ku  02/03/2022   Procedure: [place] Ku;  Surgeon: Mansouraty, Telford Nab., MD;  Location: [facility] ENDOSCOPY;  Service:  Gastroenterology;;  injection of epinephrine into gastric polyp   SUBMUCOSAL LIFTING INJECTION  01/16/2020   Procedure: SUBMUCOSAL LIFTING INJECTION;  Surgeon: Irving Copas., MD;  Location: [place];  Service: Gastroenterology;;   WISDOM TOOTH EXTRACTION      Allergies  Allergen Reactions   Black Pepper [[place]] Other (See Comments)    bloating   Ciprofloxacin Other (See Comments)    Bleeding-blood in stool    Nsaids     History of bleeding    Outpatient Encounter Medications as of 08/04/2022  Medication Sig   amLODipine (NORVASC) 2.5 MG tablet Take 1 tablet (2.5 mg total) by mouth daily.   b complex vitamins tablet Take 1 tablet by mouth daily.   bimatoprost (LUMIGAN) 0.01 % SOLN Place 1 drop into both eyes at bedtime.   cetirizine (ZYRTEC) 10 MG tablet Take 10 mg by mouth daily as needed for allergies.    Cholecalciferol (VITAMIN D) 2000 UNITS CAPS Take 1 capsule (2,000 Units total) by mouth daily.   colchicine 0.6 MG tablet Take 1 tablet (0.6 mg total) by mouth daily.   fluticasone (FLONASE) 50 MCG/ACT nasal spray Place 2 sprays into both nostrils daily as needed for allergies or rhinitis.   folic acid (FOLVITE) 419 MCG tablet Take 800 mcg by mouth daily.    Glucosamine-Chondroit-Vit C-Mn (GLUCOSAMINE CHONDR 1500 COMPLX PO) Take 2 tablets by mouth daily.    omeprazole (PRILOSEC) 40 MG capsule Take 1 capsule (40 mg total) by mouth 2 (two) times daily before a meal.   rosuvastatin (CRESTOR) 5 MG tablet TAKE 1 TABLET BY MOUTH DAILY   timolol (TIMOPTIC) 0.5 % ophthalmic solution Place 1 drop into both eyes in the morning.   vitamin B-12 (CYANOCOBALAMIN) 1000 MCG tablet Take 1,000 mcg by mouth daily.   No facility-administered encounter medications on file as of 08/04/2022.    Review of Systems:  Review of Systems  Constitutional:  Positive for activity change. Negative for appetite change, chills, diaphoresis, fatigue, fever and unexpected weight change.   Musculoskeletal:  Positive for arthralgias and joint swelling.  Skin:        warmth    Health Maintenance  Topic Date Due   COVID-19 Vaccine (7 - Moderna risk series) 08/14/2022   Medicare Annual Wellness (AWV)  07/16/2023   Pneumonia Vaccine 52+ Years old  Completed   INFLUENZA VACCINE  Completed   Zoster Vaccines- Shingrix  Completed   HPV VACCINES  Aged Out   COLONOSCOPY (Pts 45-77yr Insurance coverage will need to be confirmed)  Discontinued    Physical Exam: There were no vitals filed for this visit.  There is no height or weight on file to calculate BMI. Physical Exam Vitals and nursing note reviewed.  Musculoskeletal:        General: Swelling (left ankle) present. No tenderness, deformity or signs of injury.     Comments: Left achilles area with warmth and swelling at the site.      Labs reviewed: Basic Metabolic Panel: Recent Labs    03/20/22 0000  NA  139  K 4.1  CL 102  CO2 29*  BUN 10  CREATININE 1.0  CALCIUM 9.1    Liver Function Tests: No results for input(s): "AST", "ALT", "ALKPHOS", "BILITOT", "PROT", "ALBUMIN" in the last 8760 hours. No results for input(s): "LIPASE", "AMYLASE" in the last 8760 hours. No results for input(s): "AMMONIA" in the last 8760 hours. CBC: Recent Labs    01/07/22 1003 03/20/22 0000  WBC 7.6 5.9  HGB 15.3 14.7  HCT 44.5 43  MCV 92.1  --   PLT 228.0 229    Lipid Panel: Recent Labs    03/20/22 0000  CHOL 151  HDL 49  LDLCALC 73  TRIG 150    Lab Results  Component Value Date   HGBA1C 6.1 03/20/2022    Procedures since last visit: No results found.  Assessment/Plan  1. Left ankle swelling Pre rupture achilles syndrome per ortho, injection contraindicated.  Pt would like to try something else, does have hx of gout - colchicine 0.6 MG tablet; Take 1 tablet (0.6 mg total) by mouth daily.  Dispense: 3 tablet; Refill: 0  2. HTN Bp improved at recheck Continue norvasc    Labs/tests ordered:  * No  order type specified *ESR Uric acid.  Next appt:  2 weeks    Total time 17mn:  time greater than 50% of total time spent doing pt counseling and coordination of care

## 2022-08-04 ENCOUNTER — Encounter: Payer: Self-pay | Admitting: Adult Health

## 2022-08-04 ENCOUNTER — Non-Acute Institutional Stay: Payer: Medicare Other | Admitting: Adult Health

## 2022-08-04 VITALS — BP 148/74 | HR 78 | Temp 97.7°F | Resp 18 | Ht 67.0 in | Wt 174.0 lb

## 2022-08-04 DIAGNOSIS — M25472 Effusion, left ankle: Secondary | ICD-10-CM | POA: Diagnosis not present

## 2022-08-24 ENCOUNTER — Encounter: Payer: Self-pay | Admitting: Internal Medicine

## 2022-09-09 ENCOUNTER — Non-Acute Institutional Stay: Payer: Medicare Other | Admitting: Internal Medicine

## 2022-09-09 ENCOUNTER — Encounter: Payer: Self-pay | Admitting: Internal Medicine

## 2022-09-09 VITALS — BP 158/82 | HR 93 | Temp 97.8°F | Resp 18 | Ht 67.0 in | Wt 174.0 lb

## 2022-09-09 DIAGNOSIS — B349 Viral infection, unspecified: Secondary | ICD-10-CM | POA: Diagnosis not present

## 2022-09-09 DIAGNOSIS — J310 Chronic rhinitis: Secondary | ICD-10-CM | POA: Diagnosis not present

## 2022-09-09 NOTE — Patient Instructions (Signed)
Mucinex DN as needed for Cough Drink Water Let us know if symptoms not better in 1 week

## 2022-09-09 NOTE — Progress Notes (Signed)
Location: [place] of Service:  Clinic (12)  Provider:   Code Status:  Goals of Care:     09/09/2022   11:20 AM  Advanced Directives  Does Patient Have a Medical Advance Directive? Yes  Type of Paramedic of [place];Living will  Does patient want to make changes to medical advance directive? No - Patient declined  Copy of [place] in Chart? Yes - validated most recent copy scanned in chart (See row information)     Chief Complaint  Patient presents with   Acute Visit    Patient states he  went out to eat near a lot of people and has been feelng bad. He has a cough . Tired , and some post nasal drip. He has taken a covid test but it was negative    HPI: Patient is a 81 y.o. male seen today for an acute visit for  Cough and Runny Nose  Saw lot of family members for Christmas c/o Cough dry since yesterday Covid negative No Fever or chest pain No SOB  Runny nose no sore throat    Other issues  Hypertension Doing well with low dose of Norvasc   Barrett's Esophagus S/p Ablation H/o Belching H/o Iron Def Anemia HGB normal now Prediabetes. HGA1C less then 7 Symptoms of BPH Gets up 2 night and Goes 7-8 times during day Does not want to see Urology right now or try any Meds    Past Medical History:  Diagnosis Date   Allergy    seasonal   Arthritis    knees   Barrett's esophagus    Cataract    BILATERAL REMOVED   Gastric polyp    GERD (gastroesophageal reflux disease)    Glaucoma    Hemorrhoids    Hyperlipidemia    Hypertension    Pneumonia    as teenager   RBBB    RBBB    Vasovagal syncope    last episode was 2015-16; none since   Wears glasses     Past Surgical History:  Procedure Laterality Date   BIOPSY  01/16/2020   Procedure: BIOPSY;  Surgeon: Irving Copas., MD;  Location: [facility] ENDOSCOPY;  Service: Gastroenterology;;   [place], BILATERAL  2021   COLONOSCOPY  2002; 2007   ENDOSCOPIC MUCOSAL RESECTION N/A 01/16/2020   Procedure: ENDOSCOPIC MUCOSAL RESECTION;  Surgeon: Irving Copas., MD;  Location: [place];  Service: Gastroenterology;  Laterality: N/A;   ENDOSCOPIC MUCOSAL RESECTION N/A 02/03/2022   Procedure: ENDOSCOPIC MUCOSAL RESECTION;  Surgeon: Rush Landmark Telford Nab., MD;  Location: [facility] ENDOSCOPY;  Service: Gastroenterology;  Laterality: N/A;   ESOPHAGOGASTRODUODENOSCOPY (EGD) WITH PROPOFOL N/A 01/16/2020   Procedure: ESOPHAGOGASTRODUODENOSCOPY (EGD) WITH PROPOFOL;  Surgeon: Rush Landmark Telford Nab., MD;  Location: [place];  Service: Gastroenterology;  Laterality: N/A;   ESOPHAGOGASTRODUODENOSCOPY (EGD) WITH PROPOFOL N/A 02/03/2022   Procedure: ESOPHAGOGASTRODUODENOSCOPY (EGD) WITH PROPOFOL;  Surgeon: Rush Landmark Telford Nab., MD;  Location: [facility] ENDOSCOPY;  Service: Gastroenterology;  Laterality: N/A;   GI RADIOFREQUENCY ABLATION  02/03/2022   Procedure: GI RADIOFREQUENCY ABLATION;  Surgeon: Rush Landmark Telford Nab., MD;  Location: [facility] ENDOSCOPY;  Service: Gastroenterology;;   HEMOSTASIS CLIP PLACEMENT  01/16/2020   Procedure: HEMOSTASIS CLIP PLACEMENT;  Surgeon: Irving Copas., MD;  Location: [facility] ENDOSCOPY;  Service: Gastroenterology;;   HEMOSTASIS CLIP PLACEMENT  02/03/2022   Procedure: HEMOSTASIS CLIP PLACEMENT;  Surgeon: Justice Britain  Brooke Bonito., MD;  Location: [facility] ENDOSCOPY;  Service: Gastroenterology;;   HEMOSTASIS CONTROL  01/16/2020   Procedure: HEMOSTASIS CONTROL;  Surgeon: Irving Copas., MD;  Location: [place];  Service: Gastroenterology;;   POLYPECTOMY     POLYPECTOMY  02/03/2022   Procedure: POLYPECTOMY;  Surgeon: Irving Copas., MD;  Location: [facility] ENDOSCOPY;  Service: Gastroenterology;;   [place] Ku  02/03/2022   Procedure: [place] Ku;  Surgeon: Mansouraty, Telford Nab., MD;  Location: [facility] ENDOSCOPY;  Service:  Gastroenterology;;  injection of epinephrine into gastric polyp   SUBMUCOSAL LIFTING INJECTION  01/16/2020   Procedure: SUBMUCOSAL LIFTING INJECTION;  Surgeon: Irving Copas., MD;  Location: [place];  Service: Gastroenterology;;   WISDOM TOOTH EXTRACTION      Allergies  Allergen Reactions   Black Pepper [[place]] Other (See Comments)    bloating   Ciprofloxacin Other (See Comments)    Bleeding-blood in stool    Nsaids     History of bleeding    Outpatient Encounter Medications as of 09/09/2022  Medication Sig   amLODipine (NORVASC) 2.5 MG tablet Take 1 tablet (2.5 mg total) by mouth daily.   b complex vitamins tablet Take 1 tablet by mouth daily.   bimatoprost (LUMIGAN) 0.01 % SOLN Place 1 drop into both eyes at bedtime.   cetirizine (ZYRTEC) 10 MG tablet Take 10 mg by mouth daily as needed for allergies.    Cholecalciferol (VITAMIN D) 2000 UNITS CAPS Take 1 capsule (2,000 Units total) by mouth daily.   colchicine 0.6 MG tablet Take 1 tablet (0.6 mg total) by mouth daily.   COMIRNATY syringe Inject 0.3 mLs into the muscle once.   fluticasone (FLONASE) 50 MCG/ACT nasal spray Place 2 sprays into both nostrils daily as needed for allergies or rhinitis.   FLUZONE HIGH-DOSE QUADRIVALENT 0.7 ML SUSY    folic acid (FOLVITE) 767 MCG tablet Take 800 mcg by mouth daily.    Glucosamine-Chondroit-Vit C-Mn (GLUCOSAMINE CHONDR 1500 COMPLX PO) Take 2 tablets by mouth daily.    omeprazole (PRILOSEC) 40 MG capsule Take 1 capsule (40 mg total) by mouth 2 (two) times daily before a meal.   rosuvastatin (CRESTOR) 5 MG tablet TAKE 1 TABLET BY MOUTH DAILY   timolol (TIMOPTIC) 0.5 % ophthalmic solution Place 1 drop into both eyes in the morning.   vitamin B-12 (CYANOCOBALAMIN) 1000 MCG tablet Take 1,000 mcg by mouth daily.   No facility-administered encounter medications on file as of 09/09/2022.    Review of Systems:  Review of Systems  Constitutional:  Negative for activity change,  appetite change and unexpected weight change.  HENT:  Positive for postnasal drip and rhinorrhea.   Respiratory:  Positive for cough. Negative for shortness of breath.   Cardiovascular:  Negative for leg swelling.  Gastrointestinal:  Negative for constipation.  Genitourinary:  Negative for frequency.  Musculoskeletal:  Negative for arthralgias, gait problem and myalgias.  Skin: Negative.  Negative for rash.  Neurological:  Negative for dizziness and weakness.  Psychiatric/Behavioral:  Negative for confusion and sleep disturbance.   All other systems reviewed and are negative.   Health Maintenance  Topic Date Due   COVID-19 Vaccine (7 - 2023-24 season) 08/14/2022   Medicare Annual Wellness (AWV)  07/16/2023   DTaP/Tdap/Td (3 - Td or Tdap) 11/02/2030   Pneumonia Vaccine 73+ Years old  Completed   INFLUENZA VACCINE  Completed   Zoster Vaccines- Shingrix  Completed   HPV VACCINES  Aged Out   COLONOSCOPY (Pts 45-35yr Insurance coverage will need to  be confirmed)  Discontinued    Physical Exam: Vitals:   09/09/22 1117  BP: (!) 158/82  Pulse: 93  Resp: 18  Temp: 97.8 F (36.6 C)  TempSrc: Temporal  SpO2: 94%  Weight: 174 lb (78.9 kg)  Height: '5\' 7"'$  (1.702 m)   Body mass index is 27.25 kg/m. Physical Exam Vitals reviewed.  Constitutional:      Appearance: Normal appearance.  HENT:     Head: Normocephalic.     Nose: Nose normal. No congestion.     Mouth/Throat:     Mouth: Mucous membranes are moist.     Pharynx: Oropharynx is clear.     Comments: Mild Redness in throat Eyes:     Pupils: Pupils are equal, round, and reactive to light.  Cardiovascular:     Rate and Rhythm: Normal rate and regular rhythm.     Pulses: Normal pulses.     Heart sounds: No murmur heard. Pulmonary:     Effort: Pulmonary effort is normal. No respiratory distress.     Breath sounds: Normal breath sounds. No wheezing or rales.  Abdominal:     General: Abdomen is flat. Bowel sounds are  normal.     Palpations: Abdomen is soft.  Musculoskeletal:        General: No swelling.     Cervical back: Neck supple.  Skin:    General: Skin is warm.  Neurological:     General: No focal deficit present.     Mental Status: He is alert and oriented to person, place, and time.  Psychiatric:        Mood and Affect: Mood normal.        Thought Content: Thought content normal.     Labs reviewed: Basic Metabolic Panel: Recent Labs    03/20/22 0000  NA 139  K 4.1  CL 102  CO2 29*  BUN 10  CREATININE 1.0  CALCIUM 9.1   Liver Function Tests: No results for input(s): "AST", "ALT", "ALKPHOS", "BILITOT", "PROT", "ALBUMIN" in the last 8760 hours. No results for input(s): "LIPASE", "AMYLASE" in the last 8760 hours. No results for input(s): "AMMONIA" in the last 8760 hours. CBC: Recent Labs    01/07/22 1003 03/20/22 0000  WBC 7.6 5.9  HGB 15.3 14.7  HCT 44.5 43  MCV 92.1  --   PLT 228.0 229   Lipid Panel: Recent Labs    03/20/22 0000  CHOL 151  HDL 49  LDLCALC 73  TRIG 150   Lab Results  Component Value Date   HGBA1C 6.1 03/20/2022    Procedures since last visit: No results found.  Assessment/Plan 1. Viral infection Drink water Mucinex DM Prn Call us if symptoms dont get better   Allergic Rhinitis Use Flonase qd for 1 week   Labs/tests ordered:  * No order type specified * Next appt:  10/06/2022

## 2022-09-10 ENCOUNTER — Encounter: Payer: Self-pay | Admitting: Internal Medicine

## 2022-09-10 ENCOUNTER — Other Ambulatory Visit: Payer: Self-pay | Admitting: Orthopedic Surgery

## 2022-09-10 DIAGNOSIS — R062 Wheezing: Secondary | ICD-10-CM

## 2022-09-10 DIAGNOSIS — J111 Influenza due to unidentified influenza virus with other respiratory manifestations: Secondary | ICD-10-CM

## 2022-09-10 MED ORDER — ALBUTEROL SULFATE HFA 108 (90 BASE) MCG/ACT IN AERS: 2.0000 | INHALATION_SPRAY | Freq: Four times a day (QID) | RESPIRATORY_TRACT | 0 refills | Status: DC | PRN

## 2022-09-10 MED ORDER — OSELTAMIVIR PHOSPHATE 75 MG PO CAPS
75.0000 mg | ORAL_CAPSULE | Freq: Two times a day (BID) | ORAL | 0 refills | Status: AC
Start: 2022-09-10 — End: 2022-09-15

## 2022-09-10 NOTE — Progress Notes (Signed)
[facility] [place] nurse made home visit this afternoon due to fever and wheezing. Rapid flu test positive. Expiratory wheezing heard on auscultation. Temp 99-100. Will send in Tamiflu and albuterol for symptoms. Advised to contact PCP if symptoms do not improve.

## 2022-09-15 ENCOUNTER — Other Ambulatory Visit: Payer: Self-pay | Admitting: Internal Medicine

## 2022-09-16 ENCOUNTER — Ambulatory Visit
Admission: RE | Admit: 2022-09-16 | Discharge: 2022-09-16 | Disposition: A | Discharge status: Home / Self Care | Payer: Medicare Other | Source: Ambulatory Visit | Attending: Family | Admitting: Family

## 2022-09-16 ENCOUNTER — Encounter: Payer: Self-pay | Admitting: Family

## 2022-09-16 ENCOUNTER — Ambulatory Visit: Payer: Medicare Other | Admitting: Family

## 2022-09-16 VITALS — BP 130/70 | HR 78 | Temp 96.5°F | Resp 16 | Ht 67.0 in | Wt 171.6 lb

## 2022-09-16 DIAGNOSIS — R0982 Postnasal drip: Secondary | ICD-10-CM | POA: Diagnosis not present

## 2022-09-16 DIAGNOSIS — R059 Cough, unspecified: Secondary | ICD-10-CM | POA: Diagnosis not present

## 2022-09-16 DIAGNOSIS — R062 Wheezing: Secondary | ICD-10-CM | POA: Diagnosis not present

## 2022-09-16 MED ORDER — PREDNISONE 20 MG PO TABS
ORAL_TABLET | ORAL | 0 refills | Status: AC
Start: 2022-09-16 — End: 2022-09-20

## 2022-09-16 NOTE — Progress Notes (Signed)
Provider: Latina Frank FNP-C  Virgie Dad, MD  Patient Care Team: Virgie Dad, MD as PCP - General (Internal Medicine) [facility], [facility], Carlota Raspberry, MD as Consulting Physician (Gastroenterology) Mansouraty, Telford Nab., MD as Consulting Physician (Gastroenterology)  Extended Emergency Contact Information Primary Emergency Contact: [facility] Address: [address]          [place], [place] [address] Johnnette Litter of [place] Phone: [phone] Mobile Phone: [phone] Relation: Spouse  Code Status:  Full Code  Goals of care: Advanced Directive information    09/16/2022    9:32 AM  Advanced Directives  Does Patient Have a Medical Advance Directive? Yes  Type of Advance Directive Living will;Healthcare Power of Attorney  Does patient want to make changes to medical advance directive? No - Patient declined  Copy of [place] in Chart? Yes - validated most recent copy scanned in chart (See row information)     Chief Complaint  Patient presents with   Acute Visit    Patient is positive for Flu A, and has ongoing coughing.     HPI:  Pt is a 82 y.o. male seen today for an acute visit for evaluation of ongoing cough.positive for Flu A on 09/10/2022 was treated with Tamiflu and albuterol.completed mucinex.Has been using Flonase and zyrtec. COVID-19 test was negative.  Cough clear but has drainage from the sinuses dripping behind the throat. He denies any  fever,chills,fatigue,body aches,runny nose,chest tightness,chest pain,palpitation or shortness of breath. Has upcoming cruise trip this coming weekend wonders if it's okay.advised not travel if having any fever,chills,chest tightness or shortness of breath or not feeling well. States cough definitely feels much better just PND.   Past Medical History:  Diagnosis Date   Allergy    seasonal   Arthritis    knees   Barrett's esophagus    Cataract     BILATERAL REMOVED   Gastric polyp    GERD (gastroesophageal reflux disease)    Glaucoma    Hemorrhoids    Hyperlipidemia    Hypertension    Pneumonia    as teenager   RBBB    RBBB    Vasovagal syncope    last episode was 2015-16; none since   Wears glasses    Past Surgical History:  Procedure Laterality Date   BIOPSY  01/16/2020   Procedure: BIOPSY;  Surgeon: Irving Copas., MD;  Location: [facility] ENDOSCOPY;  Service: Gastroenterology;;   [place], BILATERAL  2021   COLONOSCOPY  2002; 2007   ENDOSCOPIC MUCOSAL RESECTION N/A 01/16/2020   Procedure: ENDOSCOPIC MUCOSAL RESECTION;  Surgeon: Irving Copas., MD;  Location: [place];  Service: Gastroenterology;  Laterality: N/A;   ENDOSCOPIC MUCOSAL RESECTION N/A 02/03/2022   Procedure: ENDOSCOPIC MUCOSAL RESECTION;  Surgeon: Rush Landmark Telford Nab., MD;  Location: [facility] ENDOSCOPY;  Service: Gastroenterology;  Laterality: N/A;   ESOPHAGOGASTRODUODENOSCOPY (EGD) WITH PROPOFOL N/A 01/16/2020   Procedure: ESOPHAGOGASTRODUODENOSCOPY (EGD) WITH PROPOFOL;  Surgeon: Rush Landmark Telford Nab., MD;  Location: [place];  Service: Gastroenterology;  Laterality: N/A;   ESOPHAGOGASTRODUODENOSCOPY (EGD) WITH PROPOFOL N/A 02/03/2022   Procedure: ESOPHAGOGASTRODUODENOSCOPY (EGD) WITH PROPOFOL;  Surgeon: Rush Landmark Telford Nab., MD;  Location: [facility] ENDOSCOPY;  Service: Gastroenterology;  Laterality: N/A;   GI RADIOFREQUENCY ABLATION  02/03/2022   Procedure: GI RADIOFREQUENCY ABLATION;  Surgeon: Rush Landmark Telford Nab., MD;  Location: [facility] ENDOSCOPY;  Service: Gastroenterology;;   HEMOSTASIS CLIP PLACEMENT  01/16/2020  Procedure: HEMOSTASIS CLIP PLACEMENT;  Surgeon: Irving Copas., MD;  Location: [place];  Service: Gastroenterology;;   HEMOSTASIS CLIP PLACEMENT  02/03/2022   Procedure: HEMOSTASIS CLIP PLACEMENT;  Surgeon: Irving Copas., MD;  Location: [facility];  Service: Gastroenterology;;   HEMOSTASIS CONTROL  01/16/2020   Procedure: HEMOSTASIS CONTROL;  Surgeon: Irving Copas., MD;  Location: [place];  Service: Gastroenterology;;   POLYPECTOMY     POLYPECTOMY  02/03/2022   Procedure: POLYPECTOMY;  Surgeon: Irving Copas., MD;  Location: [facility];  Service: Gastroenterology;;   Woodward Ku  02/03/2022   Procedure: [place] Ku;  Surgeon: Mansouraty, Telford Nab., MD;  Location: [facility] ENDOSCOPY;  Service: Gastroenterology;;  injection of epinephrine into gastric polyp   SUBMUCOSAL LIFTING INJECTION  01/16/2020   Procedure: SUBMUCOSAL LIFTING INJECTION;  Surgeon: Irving Copas., MD;  Location: [place];  Service: Gastroenterology;;   WISDOM TOOTH EXTRACTION      Allergies  Allergen Reactions   Black Pepper [Piper] Other (See Comments)    bloating   Ciprofloxacin Other (See Comments)    Bleeding-blood in stool    Nsaids     History of bleeding    Outpatient Encounter Medications as of 09/16/2022  Medication Sig   albuterol (VENTOLIN HFA) 108 (90 Base) MCG/ACT inhaler Inhale 2 puffs into the lungs every 6 (six) hours as needed for wheezing or shortness of breath.   amLODipine (NORVASC) 2.5 MG tablet TAKE ONE TABLET BY MOUTH DAILY   b complex vitamins tablet Take 1 tablet by mouth daily.   bimatoprost (LUMIGAN) 0.01 % SOLN Place 1 drop into both eyes at bedtime.   cetirizine (ZYRTEC) 10 MG tablet Take 10 mg by mouth daily as needed for allergies.    Cholecalciferol (VITAMIN D) 2000 UNITS CAPS Take 1 capsule (2,000 Units total) by mouth daily.   COMIRNATY syringe Inject 0.3 mLs into the muscle once.   fluticasone (FLONASE) 50 MCG/ACT nasal spray Place 2 sprays into both nostrils daily as needed for allergies or rhinitis.   folic acid (FOLVITE) 878 MCG tablet Take 800 mcg by mouth daily.    Glucosamine-Chondroit-Vit C-Mn (GLUCOSAMINE CHONDR 1500 COMPLX PO) Take 2 tablets by mouth daily.    omeprazole  (PRILOSEC) 40 MG capsule Take 1 capsule (40 mg total) by mouth 2 (two) times daily before a meal.   rosuvastatin (CRESTOR) 5 MG tablet TAKE 1 TABLET BY MOUTH DAILY   timolol (TIMOPTIC) 0.5 % ophthalmic solution Place 1 drop into both eyes in the morning.   vitamin B-12 (CYANOCOBALAMIN) 1000 MCG tablet Take 1,000 mcg by mouth daily.   [DISCONTINUED] colchicine 0.6 MG tablet Take 1 tablet (0.6 mg total) by mouth daily.   [DISCONTINUED] FLUZONE HIGH-DOSE QUADRIVALENT 0.7 ML SUSY    No facility-administered encounter medications on file as of 09/16/2022.    Review of Systems  Constitutional:  Negative for appetite change, chills, fatigue, fever and unexpected weight change.  HENT:  Positive for postnasal drip and rhinorrhea. Negative for congestion, dental problem, ear discharge, ear pain, facial swelling, hearing loss, nosebleeds, sinus pressure, sinus pain, sneezing, sore throat, tinnitus and trouble swallowing.   Eyes:  Negative for pain, discharge, redness, itching and visual disturbance.  Respiratory:  Positive for cough. Negative for chest tightness, shortness of breath and wheezing.   Cardiovascular:  Negative for chest pain, palpitations and leg swelling.  Gastrointestinal:  Negative for abdominal distention, abdominal pain, diarrhea, nausea and vomiting.  Skin:  Negative for color change, pallor and rash.  Neurological:  Negative for dizziness, weakness, light-headedness and headaches.    Immunization History  Administered Date(s) Administered   Fluad Quad(high Dose 65+) 06/20/2021   Influenza Whole 06/15/2012, 06/15/2013   Influenza, High Dose Seasonal PF 06/19/2016, 07/13/2020, 06/19/2022   Influenza, Quadrivalent, Recombinant, Inj, Pf 06/16/2019   Influenza,inj,Quad PF,6+ Mos 06/15/2018   Influenza-Unspecified 06/14/2014, 06/21/2015, 06/30/2017, 06/28/2020   Moderna Covid-19 Vaccine Bivalent Booster 40yr & up 01/15/2022   Moderna SARS-COV2 Booster Vaccination 01/28/2021    Moderna Sars-Covid-2 Vaccination 09/27/2019, 10/26/2019, 07/31/2020, 06/28/2021   Pneumococcal Conjugate-13 10/31/2014   Pneumococcal Polysaccharide-23 09/16/2011   Tdap 12/08/2013, 11/02/2020   Unspecified SARS-COV-2 Vaccination 06/28/2021, 06/19/2022   Zoster Recombinat (Shingrix) 08/25/2017, 11/02/2017   Pertinent  Health Maintenance Due  Topic Date Due   INFLUENZA VACCINE  Completed   COLONOSCOPY (Pts [id number] coverage will need to be confirmed)  Discontinued      07/15/2022    9:38 AM 07/21/2022    1:47 PM 08/04/2022    2:03 PM 09/09/2022   11:19 AM 09/16/2022    9:31 AM  Fall Risk  Falls in the past year? 0 0 0 0 0  Was there an injury with Fall? 0 0 0 0 0  Fall Risk Category Calculator 0 0 0 0 0  Fall Risk Category Low Low Low Low Low  Patient Fall Risk Level Low fall risk Low fall risk Low fall risk Low fall risk Low fall risk  Patient at Risk for Falls Due to No Fall Risks No Fall Risks No Fall Risks History of fall(s) No Fall Risks  Fall risk Follow up Falls evaluation completed Falls evaluation completed Falls evaluation completed Falls evaluation completed Falls evaluation completed   Functional Status Survey:    Vitals:   09/16/22 0929  BP: 130/70  Pulse: 78  Resp: 16  Temp: (!) 96.5 F (35.8 C)  SpO2: 98%  Weight: 171 lb 9.6 oz (77.8 kg)  Height: '5\' 7"'$  (1.702 m)   Body mass index is 26.88 kg/m. Physical Exam Vitals reviewed.  Constitutional:      General: He is not in acute distress.    Appearance: Normal appearance. He is overweight. He is not ill-appearing or diaphoretic.  HENT:     Head: Normocephalic.     Right Ear: Tympanic membrane, ear canal and external ear normal. There is no impacted cerumen.     Left Ear: Tympanic membrane, ear canal and external ear normal. There is no impacted cerumen.     Nose: Nose normal. No congestion or rhinorrhea.     Mouth/Throat:     Mouth: Mucous membranes are moist.     Pharynx: Oropharynx is clear.  No oropharyngeal exudate or posterior oropharyngeal erythema.  Eyes:     General: No scleral icterus.       Right eye: No discharge.        Left eye: No discharge.     Conjunctiva/sclera: Conjunctivae normal.     Pupils: Pupils are equal, round, and reactive to light.  Neck:     Vascular: No carotid bruit.  Cardiovascular:     Rate and Rhythm: Normal rate and regular rhythm.     Pulses: Normal pulses.     Heart sounds: Normal heart sounds. No murmur heard.    No friction rub. No gallop.  Pulmonary:     Effort: Pulmonary effort is normal. No respiratory distress.     Breath sounds: Examination of the right-upper field reveals wheezing. Examination of the left-upper field reveals wheezing.  Examination of the right-middle field reveals wheezing. Examination of the left-middle field reveals wheezing. Wheezing present. No rhonchi or rales.  Chest:     Chest wall: No tenderness.  Abdominal:     General: Bowel sounds are normal. There is no distension.     Palpations: Abdomen is soft. There is no mass.     Tenderness: There is no abdominal tenderness. There is no right CVA tenderness, left CVA tenderness, guarding or rebound.  Musculoskeletal:        General: No swelling or tenderness. Normal range of motion.     Cervical back: Normal range of motion. No rigidity or tenderness.     Comments: Bilateral lower extremities trace edema  Lymphadenopathy:     Cervical: No cervical adenopathy.  Skin:    General: Skin is warm and dry.     Coloration: Skin is not pale.     Findings: No erythema or rash.  Neurological:     Mental Status: He is alert and oriented to person, place, and time.     Gait: Gait normal.  Psychiatric:        Mood and Affect: Mood normal.        Speech: Speech normal.        Behavior: Behavior normal.     Labs reviewed: Recent Labs    03/20/22 0000  NA 139  K 4.1  CL 102  CO2 29*  BUN 10  CREATININE 1.0  CALCIUM 9.1   No results for input(s): "AST", "ALT",  "ALKPHOS", "BILITOT", "PROT", "ALBUMIN" in the last 8760 hours. Recent Labs    01/07/22 1003 03/20/22 0000  WBC 7.6 5.9  HGB 15.3 14.7  HCT 44.5 43  MCV 92.1  --   PLT 228.0 229   Lab Results  Component Value Date   TSH 3.49 01/24/2021   Lab Results  Component Value Date   HGBA1C 6.1 03/20/2022   Lab Results  Component Value Date   CHOL 151 03/20/2022   HDL 49 03/20/2022   LDLCALC 73 03/20/2022   TRIG 150 03/20/2022    Significant Diagnostic Results in last 30 days:  No results found.  Assessment/Plan 1. Cough, unspecified type Afebrile.Influenza positive on 09/10/2022 treated with Tamiflu at [facility].COVID-19 test was negative. Bilateral lung expiration wheezes  - continue on albuterol  - start on tapered prednisone as below.side effects discussed.  -will obtain imaging to rule out other acute abnormalities. - Please get chest X-ray at [place] at [facility] then will call you with results. - predniSONE (DELTASONE) 20 MG tablet; Take 2 tablets (40 mg total) by mouth daily with breakfast for 1 day, THEN 1.5 tablets (30 mg total) daily with breakfast for 1 day, THEN 1 tablet (20 mg total) daily with breakfast for 1 day, THEN 0.5 tablets (10 mg total) daily with breakfast for 1 day.  Dispense: 5 tablet; Refill: 0 - DG Chest 2 View; Future  2. Wheezing on expiration Bilateral lung wheezes  - start on tapered prednisone as below  - predniSONE (DELTASONE) 20 MG tablet; Take 2 tablets (40 mg total) by mouth daily with breakfast for 1 day, THEN 1.5 tablets (30 mg total) daily with breakfast for 1 day, THEN 1 tablet (20 mg total) daily with breakfast for 1 day, THEN 0.5 tablets (10 mg total) daily with breakfast for 1 day.  Dispense: 5 tablet; Refill: 0 - DG Chest 2 View; Future  3. PND (post-nasal drip) - continue on Flonase  and Zyrtec  - Saline nasal spray as needed prior to using Flonase  - Encouraged to increase fluid intake/warm tea  or soup  - Notify provider if symptoms worsen or fail to improve  Family/ staff Communication: Reviewed plan of care with patient verbalized understanding  Labs/tests ordered:  - DG Chest 2 View; Future  Next Appointment:Return if symptoms worsen or fail to improve.   Sandrea Hughs, NP

## 2022-09-16 NOTE — Patient Instructions (Signed)
-    Please get chest X-ray at [place] at [facility] then will call you with results.  - Notify provider if symptoms worsen or fail to improve

## 2022-09-18 DIAGNOSIS — R600 Localized edema: Secondary | ICD-10-CM | POA: Diagnosis not present

## 2022-09-18 DIAGNOSIS — E782 Mixed hyperlipidemia: Secondary | ICD-10-CM | POA: Diagnosis not present

## 2022-09-18 DIAGNOSIS — I10 Essential (primary) hypertension: Secondary | ICD-10-CM | POA: Diagnosis not present

## 2022-09-18 LAB — COMPREHENSIVE METABOLIC PANEL WITH GFR
Albumin: 3.8 (ref 3.5–5.0)
Calcium: 9.1 (ref 8.7–10.7)
Globulin: 2.4
eGFR: 77

## 2022-09-18 LAB — BASIC METABOLIC PANEL WITH GFR
BUN: 13 (ref 4–21)
CO2: 31 — AB (ref 13–22)
Chloride: 99 (ref 99–108)
Creatinine: 1 (ref 0.6–1.3)
Glucose: 112
Potassium: 3.9 meq/L (ref 3.5–5.1)
Sodium: 140 (ref 137–147)

## 2022-09-18 LAB — LIPID PANEL
Cholesterol: 159 (ref 0–200)
HDL: 57 (ref 35–70)
LDL Cholesterol: 82
LDl/HDL Ratio: 2.8
Triglycerides: 101 (ref 40–160)

## 2022-09-18 LAB — HEPATIC FUNCTION PANEL
ALT: 114 U/L — AB (ref 10–40)
AST: 73 — AB (ref 14–40)
Alkaline Phosphatase: 124 (ref 25–125)
Bilirubin, Total: 0.3

## 2022-09-18 LAB — CBC AND DIFFERENTIAL
HCT: 40 — AB (ref 41–53)
Hemoglobin: 13.8 (ref 13.5–17.5)
Platelets: 278 10*3/uL (ref 150–400)
WBC: 10.2

## 2022-09-18 LAB — CBC: RBC: 4.31 (ref 3.87–5.11)

## 2022-09-19 ENCOUNTER — Encounter: Payer: Self-pay | Admitting: Internal Medicine

## 2022-09-23 NOTE — Progress Notes (Signed)
Quick abstraction

## 2022-10-02 ENCOUNTER — Telehealth (INDEPENDENT_AMBULATORY_CARE_PROVIDER_SITE_OTHER): Payer: Medicare Other | Admitting: Adult Health

## 2022-10-02 ENCOUNTER — Encounter: Payer: Self-pay | Admitting: Adult Health

## 2022-10-02 DIAGNOSIS — U071 COVID-19: Secondary | ICD-10-CM

## 2022-10-02 MED ORDER — NIRMATRELVIR/RITONAVIR (PAXLOVID)TABLET: 3.0000 | ORAL_TABLET | Freq: Two times a day (BID) | ORAL | 0 refills | Status: AC

## 2022-10-02 NOTE — Progress Notes (Signed)
DATE:  10/02/2022 MRN:  [id number]  BIRTHDAY: 1941-01-19   Contact Information     Name Relation Home Work [place] Spouse [phone]  [phone]        Code Status History     Date Active Date Inactive Code Status Order ID Comments User Context   07/27/2019 1033 01/16/2020 0825 Full Code [id number]  [facility]       Provider:  at [facility]  Patient:  at [facility] Complaint  Patient presents with   Acute Visit    COVID positive this morning, Fatigue, cough, nasal congestion was exposed to son in law who tested positive on yesterday, 10/01/22    HISTORY OF PRESENT ILLNESS: This is an 82 year old male who had a video visit today due to testing positive for COVID-19 this morning. He came back from a [place] 4 days ago. Son-in-law, who was in the cruise too, visited him and his wife 2 days ago. He got a call last night from his son-in-law that he tested positive for COVID-19. He and his wife tested this morning and he was positive for COVID-19. He complains of having clear nasal drip with occasional cough. He denies fever, chills, sore throat and body aches. He stated that he has good appetite and able to smell and taste food. 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 Discussed that he needs to isolate for 5 days and mask for 10 days.    PAST MEDICAL HISTORY:  Past Medical History:  Diagnosis Date   Allergy    seasonal   Arthritis    knees   Barrett's esophagus    Cataract    BILATERAL REMOVED   Gastric polyp    GERD (gastroesophageal reflux disease)    Glaucoma    Hemorrhoids    Hyperlipidemia    Hypertension    Pneumonia    as teenager   RBBB    RBBB    Vasovagal syncope    last episode was 2015-16; none since   Wears glasses      CURRENT MEDICATIONS: Reviewed  Patient's Medications  New Prescriptions   No medications on file  Previous Medications   ALBUTEROL  (VENTOLIN HFA) 108 (90 BASE) MCG/ACT INHALER    Inhale 2 puffs into the lungs every 6 (six) hours as needed for wheezing or shortness of breath.   AMLODIPINE (NORVASC) 2.5 MG TABLET    TAKE ONE TABLET BY MOUTH DAILY   B COMPLEX VITAMINS TABLET    Take 1 tablet by mouth daily.   BIMATOPROST (LUMIGAN) 0.01 % SOLN    Place 1 drop into both eyes at bedtime.   CETIRIZINE (ZYRTEC) 10 MG TABLET    Take 10 mg by mouth daily as needed for allergies.    CHOLECALCIFEROL (VITAMIN D) 2000 UNITS CAPS    Take 1 capsule (2,000 Units total) by mouth daily.   COMIRNATY SYRINGE    Inject 0.3 mLs into the muscle once.   FLUTICASONE (FLONASE) 50 MCG/ACT NASAL SPRAY    Place 2 sprays into both nostrils daily as needed for allergies or rhinitis.   FOLIC ACID (FOLVITE) 315 MCG TABLET    Take 800 mcg by mouth daily.    GLUCOSAMINE-CHONDROIT-VIT C-MN (GLUCOSAMINE CHONDR 1500 COMPLX PO)    Take 2 tablets by mouth daily.    OMEPRAZOLE (PRILOSEC) 40 MG CAPSULE    Take 1 capsule (40 mg total) by mouth 2 (  two) times daily before a meal.   ROSUVASTATIN (CRESTOR) 5 MG TABLET    TAKE 1 TABLET BY MOUTH DAILY   TIMOLOL (TIMOPTIC) 0.5 % OPHTHALMIC SOLUTION    Place 1 drop into both eyes in the morning.   VITAMIN B-12 (CYANOCOBALAMIN) 1000 MCG TABLET    Take 1,000 mcg by mouth daily.  Modified Medications   No medications on file  Discontinued Medications   No medications on file     Allergies  Allergen Reactions   Black Pepper [Piper] Other (See Comments)    bloating   Ciprofloxacin Other (See Comments)    Bleeding-blood in stool    Nsaids     History of bleeding     REVIEW OF SYSTEMS:  GENERAL: no change in appetite, no fatigue, no weight changes, no fever, chills or weakness SKIN: Denies rash, itching, wounds, ulcer sores, or nail abnormality EYES: Denies change in vision, dry eyes, eye pain, itching or discharge EARS: Denies change in hearing, ringing in ears, or earache NOSE: has clear nasal drainage MOUTH  and THROAT: Denies oral discomfort, gingival pain or bleeding, pain from teeth or hoarseness   RESPIRATORY: has occasional coughing CARDIAC: no chest pain, edema or palpitations GI: no abdominal pain, diarrhea, constipation, heart burn, nausea or vomiting GU: Denies dysuria, frequency, hematuria, incontinence, or discharge MUSCULOSKELETAL: Denies joit pain, muscle pain, back pain, restricted movement, or unusual weakness NEUROLOGICAL: Denies dizziness, syncope, numbness, or headache PSYCHIATRIC: Denies feeling of depression or anxiety. No report of hallucinations, insomnia, paranoia, or agitation    LABS/RADIOLOGY: Labs reviewed: Basic Metabolic Panel: Recent Labs    03/20/22 0000 09/18/22 0000  NA 139 140  K 4.1 3.9  CL 102 99  CO2 29* 31*  BUN 10 13  CREATININE 1.0 1.0  CALCIUM 9.1 9.1   Liver Function Tests: Recent Labs    09/18/22 0000  AST 73*  ALT 114*  ALKPHOS 124  ALBUMIN 3.8   No results for input(s): "LIPASE", "AMYLASE" in the last 8760 hours. No results for input(s): "AMMONIA" in the last 8760 hours. CBC: Recent Labs    01/07/22 1003 03/20/22 0000 09/18/22 0000  WBC 7.6 5.9 10.2  HGB 15.3 14.7 13.8  HCT 44.5 43 40*  MCV 92.1  --   --   PLT 228.0 229 278   A1C: Invalid input(s): "A1C" Lipid Panel: Recent Labs    03/20/22 0000 09/18/22 0000  HDL 49 57   Cardiac Enzymes: No results for input(s): "CKTOTAL", "CKMB", "CKMBINDEX", "TROPONINI" in the last 8760 hours. BNP: Invalid input(s): "POCBNP" CBG: No results for input(s): "GLUCAP" in the last 8760 hours.    DG Chest 2 View  Result Date: 09/16/2022 CLINICAL DATA:  Cough and congestion. EXAM: CHEST - 2 VIEW COMPARISON:  November 02, 2020 FINDINGS: The heart size and mediastinal contours are within normal limits. Both lungs are clear. The visualized skeletal structures are stable. IMPRESSION: No active cardiopulmonary disease. Electronically Signed   By: Abelardo Diesel M.D.   On: 09/16/2022  15:06    ASSESSMENT/PLAN:  1. COVID-19 virus infection -  Discussed that he needs to isolate for 5 days and mask for 10 days. - nirmatrelvir/ritonavir (PAXLOVID) 20 x 150 MG & 10 x '100MG'$  TABS; Take 3 tablets by mouth 2 (two) times daily for 5 days. (Take nirmatrelvir 150 mg two tablets twice daily for 5 days and ritonavir 100 mg one tablet twice daily for 5 days) Patient GFR is 77  Dispense: 30 tablet; Refill: 0  Time spent on non face to face visit:   13 minutes  The patient gave consent to this video visit. Explained to the patient the risk and privacy issue that was involved with this video call.   The patient was advised to call back and ask for an in-person evaluation if the symptoms worsen or if the condition fails to improve.   Durenda Age, NP [employer] [phone]

## 2022-10-02 NOTE — Patient Instructions (Signed)

## 2022-10-06 ENCOUNTER — Encounter: Payer: Medicare Other | Admitting: Adult Health

## 2022-10-20 ENCOUNTER — Non-Acute Institutional Stay: Payer: Medicare Other | Admitting: Adult Health

## 2022-10-20 ENCOUNTER — Encounter: Payer: Self-pay | Admitting: Adult Health

## 2022-10-20 VITALS — BP 144/82 | HR 94 | Temp 97.9°F | Resp 18 | Ht 67.0 in | Wt 168.4 lb

## 2022-10-20 DIAGNOSIS — H409 Unspecified glaucoma: Secondary | ICD-10-CM

## 2022-10-20 DIAGNOSIS — I10 Essential (primary) hypertension: Secondary | ICD-10-CM | POA: Diagnosis not present

## 2022-10-20 DIAGNOSIS — R7989 Other specified abnormal findings of blood chemistry: Secondary | ICD-10-CM | POA: Diagnosis not present

## 2022-10-20 DIAGNOSIS — K22710 Barrett's esophagus with low grade dysplasia: Secondary | ICD-10-CM | POA: Diagnosis not present

## 2022-10-20 DIAGNOSIS — E782 Mixed hyperlipidemia: Secondary | ICD-10-CM | POA: Diagnosis not present

## 2022-10-20 DIAGNOSIS — R7303 Prediabetes: Secondary | ICD-10-CM | POA: Diagnosis not present

## 2022-10-20 MED ORDER — AMLODIPINE BESYLATE 2.5 MG PO TABS: 2.5000 mg | ORAL_TABLET | Freq: Every day | ORAL | 5 refills | Status: DC

## 2022-10-20 NOTE — Progress Notes (Signed)
Location:  [facility]  POS: Clinic  Provider: Royal Hawthorn, ANP  Code Status:  Goals of Care:     10/20/2022    2:00 PM  Advanced Directives  Does Patient Have a Medical Advance Directive? Yes  Type of Advance Directive Living will;Healthcare Power of Attorney  Does patient want to make changes to medical advance directive? No - Patient declined  Copy of [place] in Chart? Yes - validated most recent copy scanned in chart (See row information)     Chief Complaint  Patient presents with   Medical Management of Chronic Issues    Patent here for a 6 month follow up    HPI: Patient is a 82 y.o. male seen today for medical management of chronic diseases.   PMH significant for Barrett's esophagus, RBBB, aortic atherosclerosis, GERD, gastric polyps, right shoulder bursitis, BPH, DDD, OA, Vasovagal syncope, vid d def, gout, iron def, glaucoma, anemia, hyperglycemia.   Had covid, seen by [facility] provider 1/18 prescribed paxlovid  Had flu A with some wheezing and prednisone and tamiflu seen by [facility] provider 09/16/22  Had elevated liver function tests 09/18/22 no symptoms of nausea or abd pain Alcohol intake two scotches per night, 1/2 glass wine  Seen multiple times for left achilles tendon pain treated with prednisone, antibiotics, voltaren, colchicine. Symptoms are resolved at this time.   See Dr Ellie Lunch for glaucoma reports stable condition, still driving.   Barrett's esophagus with excessive belching which is improved at this time Had endo 02/03/22 with ablation, polyp removal, hiatal hernia found.  On PPI   HTN 142/82  Hx of BPH gets up twice at night to urinate, no issues with stream or hesitancy   HLD: on crestor Lab Results  Component Value Date   CHOL 159 09/18/2022   HDL 57 09/18/2022   LDLCALC 82 09/18/2022   TRIG 101 09/18/2022     Prediabetes.  Lab Results  Component Value Date   HGBA1C 6.1 03/20/2022     Past Medical History:   Diagnosis Date   Allergy    seasonal   Arthritis    knees   Barrett's esophagus    Cataract    BILATERAL REMOVED   Gastric polyp    GERD (gastroesophageal reflux disease)    Glaucoma    Hemorrhoids    Hyperlipidemia    Hypertension    Pneumonia    as teenager   RBBB    RBBB    Vasovagal syncope    last episode was 2015-16; none since   Wears glasses     Past Surgical History:  Procedure Laterality Date   BIOPSY  01/16/2020   Procedure: BIOPSY;  Surgeon: Irving Copas., MD;  Location: [facility] ENDOSCOPY;  Service: Gastroenterology;;   [place], BILATERAL  2021   COLONOSCOPY  2002; 2007   ENDOSCOPIC MUCOSAL RESECTION N/A 01/16/2020   Procedure: ENDOSCOPIC MUCOSAL RESECTION;  Surgeon: Irving Copas., MD;  Location: [place];  Service: Gastroenterology;  Laterality: N/A;   ENDOSCOPIC MUCOSAL RESECTION N/A 02/03/2022   Procedure: ENDOSCOPIC MUCOSAL RESECTION;  Surgeon: Rush Landmark Telford Nab., MD;  Location: [facility] ENDOSCOPY;  Service: Gastroenterology;  Laterality: N/A;   ESOPHAGOGASTRODUODENOSCOPY (EGD) WITH PROPOFOL N/A 01/16/2020   Procedure: ESOPHAGOGASTRODUODENOSCOPY (EGD) WITH PROPOFOL;  Surgeon: Rush Landmark Telford Nab., MD;  Location: [place];  Service: Gastroenterology;  Laterality: N/A;   ESOPHAGOGASTRODUODENOSCOPY (EGD) WITH PROPOFOL N/A 02/03/2022   Procedure: ESOPHAGOGASTRODUODENOSCOPY (EGD) WITH  PROPOFOL;  Surgeon: Irving Copas., MD;  Location: [facility];  Service: Gastroenterology;  Laterality: N/A;   GI RADIOFREQUENCY ABLATION  02/03/2022   Procedure: GI RADIOFREQUENCY ABLATION;  Surgeon: Rush Landmark Telford Nab., MD;  Location: [facility];  Service: Gastroenterology;;   HEMOSTASIS CLIP PLACEMENT  01/16/2020   Procedure: HEMOSTASIS CLIP PLACEMENT;  Surgeon: Irving Copas., MD;  Location: [place];  Service: Gastroenterology;;   HEMOSTASIS CLIP PLACEMENT   02/03/2022   Procedure: HEMOSTASIS CLIP PLACEMENT;  Surgeon: Irving Copas., MD;  Location: [facility];  Service: Gastroenterology;;   HEMOSTASIS CONTROL  01/16/2020   Procedure: HEMOSTASIS CONTROL;  Surgeon: Irving Copas., MD;  Location: [place];  Service: Gastroenterology;;   POLYPECTOMY     POLYPECTOMY  02/03/2022   Procedure: POLYPECTOMY;  Surgeon: Irving Copas., MD;  Location: [facility];  Service: Gastroenterology;;   [place] Ku  02/03/2022   Procedure: [place] Ku;  Surgeon: Irving Copas., MD;  Location: [facility];  Service: Gastroenterology;;  injection of epinephrine into gastric polyp   SUBMUCOSAL LIFTING INJECTION  01/16/2020   Procedure: SUBMUCOSAL LIFTING INJECTION;  Surgeon: Irving Copas., MD;  Location: [place];  Service: Gastroenterology;;   WISDOM TOOTH EXTRACTION      Allergies  Allergen Reactions   Black Pepper [[place]] Other (See Comments)    bloating   Ciprofloxacin Other (See Comments)    Bleeding-blood in stool    Nsaids     History of bleeding    Outpatient Encounter Medications as of 10/20/2022  Medication Sig   amLODipine (NORVASC) 2.5 MG tablet TAKE ONE TABLET BY MOUTH DAILY   b complex vitamins tablet Take 1 tablet by mouth daily.   bimatoprost (LUMIGAN) 0.01 % SOLN Place 1 drop into both eyes at bedtime.   cetirizine (ZYRTEC) 10 MG tablet Take 10 mg by mouth daily as needed for allergies.    Cholecalciferol (VITAMIN D) 2000 UNITS CAPS Take 1 capsule (2,000 Units total) by mouth daily.   COMIRNATY syringe Inject 0.3 mLs into the muscle once.   fluticasone (FLONASE) 50 MCG/ACT nasal spray Place 2 sprays into both nostrils daily as needed for allergies or rhinitis.   folic acid (FOLVITE) 784 MCG tablet Take 800 mcg by mouth daily.    Glucosamine-Chondroit-Vit C-Mn (GLUCOSAMINE CHONDR 1500 COMPLX PO) Take 2 tablets by mouth daily.    omeprazole (PRILOSEC) 40 MG capsule Take 1 capsule (40 mg  total) by mouth 2 (two) times daily before a meal.   rosuvastatin (CRESTOR) 5 MG tablet TAKE 1 TABLET BY MOUTH DAILY   timolol (TIMOPTIC) 0.5 % ophthalmic solution Place 1 drop into both eyes in the morning.   vitamin B-12 (CYANOCOBALAMIN) 1000 MCG tablet Take 1,000 mcg by mouth daily.   [DISCONTINUED] albuterol (VENTOLIN HFA) 108 (90 Base) MCG/ACT inhaler Inhale 2 puffs into the lungs every 6 (six) hours as needed for wheezing or shortness of breath.   No facility-administered encounter medications on file as of 10/20/2022.    Review of Systems:  Review of Systems  Constitutional:  Negative for activity change, appetite change, chills, diaphoresis, fatigue, fever and unexpected weight change.  Respiratory:  Negative for cough, shortness of breath, wheezing and stridor.   Cardiovascular:  Negative for chest pain, palpitations and leg swelling.  Gastrointestinal:  Negative for abdominal distention, abdominal pain, constipation and diarrhea.  Genitourinary:  Negative for difficulty urinating and dysuria.  Musculoskeletal:  Negative for arthralgias, back pain, gait problem, joint swelling and myalgias.  Neurological:  Negative for dizziness,  seizures, syncope, facial asymmetry, speech difficulty, weakness and headaches.  Hematological:  Negative for adenopathy. Does not bruise/bleed easily.  Psychiatric/Behavioral:  Negative for agitation, behavioral problems and confusion.     Health Maintenance  Topic Date Due   COVID-19 Vaccine (7 - 2023-24 season) 12/15/2022 (Originally 08/14/2022)   Medicare Annual Wellness (AWV)  07/16/2023   DTaP/Tdap/Td (3 - Td or Tdap) 11/02/2030   Pneumonia Vaccine 64+ Years old  Completed   INFLUENZA VACCINE  Completed   Zoster Vaccines- Shingrix  Completed   HPV VACCINES  Aged Out   COLONOSCOPY (Pts 45-53yr Insurance coverage will need to be confirmed)  Discontinued    Physical Exam: Vitals:   10/20/22 1400 10/20/22 1403  BP: (!) 142/82 (!) 144/82   Pulse: 94   Resp: 18   Temp: 97.9 F (36.6 C)   TempSrc: Temporal   SpO2: 97%   Weight: 168 lb 6.4 oz (76.4 kg)   Height: '5\' 7"'$  (1.702 m)    Body mass index is 26.38 kg/m. Physical Exam Constitutional:      General: He is not in acute distress.    Appearance: He is not diaphoretic.  HENT:     Head: Normocephalic and atraumatic.     Right Ear: Tympanic membrane normal.     Left Ear: Tympanic membrane normal.     Nose: Nose normal.     Mouth/Throat:     Mouth: Mucous membranes are moist.     Pharynx: Oropharynx is clear.  Eyes:     Conjunctiva/sclera: Conjunctivae normal.     Pupils: Pupils are equal, round, and reactive to light.  Neck:     Thyroid: No thyromegaly.     Vascular: No JVD.     Trachea: No tracheal deviation.  Cardiovascular:     Rate and Rhythm: Normal rate and regular rhythm.     Heart sounds: No murmur heard. Pulmonary:     Effort: Pulmonary effort is normal. No respiratory distress.     Breath sounds: Normal breath sounds. No wheezing.  Abdominal:     General: Bowel sounds are normal. There is no distension.     Palpations: Abdomen is soft.     Tenderness: There is no abdominal tenderness.  Musculoskeletal:     Cervical back: No rigidity or tenderness.     Right lower leg: No edema.     Left lower leg: No edema.  Lymphadenopathy:     Cervical: No cervical adenopathy.  Skin:    General: Skin is warm and dry.  Neurological:     Mental Status: He is alert and oriented to person, place, and time.     Cranial Nerves: No cranial nerve deficit.  Psychiatric:        Mood and Affect: Mood normal.     Labs reviewed: Basic Metabolic Panel: Recent Labs    03/20/22 0000 09/18/22 0000  NA 139 140  K 4.1 3.9  CL 102 99  CO2 29* 31*  BUN 10 13  CREATININE 1.0 1.0  CALCIUM 9.1 9.1   Liver Function Tests: Recent Labs    09/18/22 0000  AST 73*  ALT 114*  ALKPHOS 124  ALBUMIN 3.8   No results for input(s): "LIPASE", "AMYLASE" in the last  8760 hours. No results for input(s): "AMMONIA" in the last 8760 hours. CBC: Recent Labs    01/07/22 1003 03/20/22 0000 09/18/22 0000  WBC 7.6 5.9 10.2  HGB 15.3 14.7 13.8  HCT 44.5 43 40*  MCV 92.1  --   --  PLT 228.0 229 278   Lipid Panel: Recent Labs    03/20/22 0000 09/18/22 0000  CHOL 151 159  HDL 49 57  LDLCALC 73 82  TRIG 150 101   Lab Results  Component Value Date   HGBA1C 6.1 03/20/2022    Procedures since last visit: No results found.  Assessment/Plan  1. Elevated LFTs The labs were drawn on 09/18/22 which was after he had recovered from flu and prior to covid No current symptoms Will repeat in 6 weeks   2. Prediabetes Lab Results  Component Value Date   HGBA1C 6.1 03/20/2022   Will check A1C at next apt  3. Essential hypertension Controlled  Continue norvasc   4. Mixed hyperlipidemia Lab Results  Component Value Date   CHOL 159 09/18/2022   HDL 57 09/18/2022   LDLCALC 82 09/18/2022   TRIG 101 09/18/2022   Continue crestor   5. Barrett's esophagus with low grade dysplasia On PPI Followed by GI  6. Mild stage glaucoma Followed by ophthalmology    Labs/tests ordered:  * No order type specified * liver function test in 6 weeks. Also BMP A1C in 4 months prior to next apt Next appt:  4 months    Total time 72mn:  time greater than 50% of total time spent doing pt counseling and coordination of care

## 2022-10-20 NOTE — Patient Instructions (Signed)
Liver function test in 6 weeks  Also labs before the apt

## 2022-10-22 ENCOUNTER — Telehealth: Payer: Self-pay | Admitting: *Deleted

## 2022-10-22 NOTE — Telephone Encounter (Signed)
Bevelyn Buckles, NP with [facility] is calling stating that she saw patient today and his feet bilaterally is cold with a purple Hue.   Stated that there is no Pain.  Quantiflow Test Shows Moderate Peripheral Artery Disease in the Right leg and Severe Peripheral Artery Disease in the Left leg.   She feels this warrants further testing.   FYI

## 2022-10-24 ENCOUNTER — Encounter: Payer: Self-pay | Admitting: Internal Medicine

## 2022-10-24 NOTE — Telephone Encounter (Signed)
Patient Notified and scheduled an appointment on 2/13 with Dr. Lyndel Safe.

## 2022-10-24 NOTE — Telephone Encounter (Signed)
Can you schedule him in my office on Tue ?

## 2022-10-27 ENCOUNTER — Other Ambulatory Visit: Payer: Self-pay | Admitting: Internal Medicine

## 2022-10-27 DIAGNOSIS — E782 Mixed hyperlipidemia: Secondary | ICD-10-CM

## 2022-10-28 ENCOUNTER — Encounter: Payer: Self-pay | Admitting: Internal Medicine

## 2022-10-28 ENCOUNTER — Non-Acute Institutional Stay: Payer: Medicare Other | Admitting: Internal Medicine

## 2022-10-28 VITALS — BP 122/64 | HR 88 | Temp 97.9°F | Resp 17 | Ht 65.5 in | Wt 169.2 lb

## 2022-10-28 DIAGNOSIS — I739 Peripheral vascular disease, unspecified: Secondary | ICD-10-CM | POA: Diagnosis not present

## 2022-10-28 DIAGNOSIS — K22710 Barrett's esophagus with low grade dysplasia: Secondary | ICD-10-CM

## 2022-10-28 DIAGNOSIS — E782 Mixed hyperlipidemia: Secondary | ICD-10-CM | POA: Diagnosis not present

## 2022-10-28 DIAGNOSIS — I10 Essential (primary) hypertension: Secondary | ICD-10-CM

## 2022-10-28 DIAGNOSIS — R7989 Other specified abnormal findings of blood chemistry: Secondary | ICD-10-CM | POA: Diagnosis not present

## 2022-10-28 NOTE — Progress Notes (Signed)
Location: [place] of Service:  Clinic (12)  Provider:   Code Status:  Goals of Care:     10/28/2022    1:41 PM  Advanced Directives  Does Patient Have a Medical Advance Directive? Yes  Type of Advance Directive Living will;Healthcare Power of Attorney  Does patient want to make changes to medical advance directive? No - Patient declined  Copy of [place] in Chart? Yes - validated most recent copy scanned in chart (See row information)     Chief Complaint  Patient presents with   Acute Visit    Patient states he is here to discuss previous house call he received a house call    HPI: Patient is a 82 y.o. male seen today for an acute visit for Follow up of Positive screening test for PAD  [place] with [place]  Patient had screening for PAD done by Home Care Nurse He had severe PAD in Left Foot He denies any pain Claudication Numbness in that leg No h/o Smoking Also has Mild swelling in that leg  Elevated LFTS Does drink Alcohol at night But also had Flu and Covid recently treated with Tamiflu and Paxlovid Other issues Hypertension Doing well with low dose of Norvasc   Barrett's Esophagus S/p Ablation H/o Belching H/o Iron Def Anemia HGB normal now Prediabetes. HGA1C less then 7 Symptoms of BPH Gets up 2 night and Goes 7-8 times during day Does not want to see Urology right now or try any Meds Past Medical History:  Diagnosis Date   Allergy    seasonal   Arthritis    knees   Barrett's esophagus    Cataract    BILATERAL REMOVED   Gastric polyp    GERD (gastroesophageal reflux disease)    Glaucoma    Hemorrhoids    Hyperlipidemia    Hypertension    Pneumonia    as teenager   RBBB    RBBB    Vasovagal syncope    last episode was 2015-16; none since   Wears glasses     Past Surgical History:  Procedure Laterality Date   BIOPSY  01/16/2020   Procedure: BIOPSY;  Surgeon: Irving Copas., MD;  Location: [facility] ENDOSCOPY;  Service: Gastroenterology;;   [place], BILATERAL  2021   COLONOSCOPY  2002; 2007   ENDOSCOPIC MUCOSAL RESECTION N/A 01/16/2020   Procedure: ENDOSCOPIC MUCOSAL RESECTION;  Surgeon: Irving Copas., MD;  Location: [place];  Service: Gastroenterology;  Laterality: N/A;   ENDOSCOPIC MUCOSAL RESECTION N/A 02/03/2022   Procedure: ENDOSCOPIC MUCOSAL RESECTION;  Surgeon: Rush Landmark Telford Nab., MD;  Location: [facility] ENDOSCOPY;  Service: Gastroenterology;  Laterality: N/A;   ESOPHAGOGASTRODUODENOSCOPY (EGD) WITH PROPOFOL N/A 01/16/2020   Procedure: ESOPHAGOGASTRODUODENOSCOPY (EGD) WITH PROPOFOL;  Surgeon: Rush Landmark Telford Nab., MD;  Location: [place];  Service: Gastroenterology;  Laterality: N/A;   ESOPHAGOGASTRODUODENOSCOPY (EGD) WITH PROPOFOL N/A 02/03/2022   Procedure: ESOPHAGOGASTRODUODENOSCOPY (EGD) WITH PROPOFOL;  Surgeon: Rush Landmark Telford Nab., MD;  Location: [facility]OS[facility];  Service: Gastroenterology;  Laterality: N/A;   GI RADIOFREQUENCY ABLATION  02/03/2022   Procedure: GI RADIOFREQUENCY ABLATION;  Surgeon: Rush Landmark Telford Nab., MD;  Location: [facility];  Service: Gastroenterology;;   HEMOSTASIS CLIP PLACEMENT  01/16/2020   Procedure: HEMOSTASIS CLIP PLACEMENT;  Surgeon: Irving Copas., MD;  Location: [place];  Service: Gastroenterology;;   HEMOSTASIS CLIP PLACEMENT  02/03/2022  Procedure: HEMOSTASIS CLIP PLACEMENT;  Surgeon: Irving Copas., MD;  Location: [facility];  Service: Gastroenterology;;   HEMOSTASIS CONTROL  01/16/2020   Procedure: HEMOSTASIS CONTROL;  Surgeon: Irving Copas., MD;  Location: [place];  Service: Gastroenterology;;   POLYPECTOMY     POLYPECTOMY  02/03/2022   Procedure: POLYPECTOMY;  Surgeon: Irving Copas., MD;  Location: [facility];  Service: Gastroenterology;;   [place] Ku  02/03/2022    Procedure: [place] Ku;  Surgeon: Mansouraty, Telford Nab., MD;  Location: [facility];  Service: Gastroenterology;;  injection of epinephrine into gastric polyp   SUBMUCOSAL LIFTING INJECTION  01/16/2020   Procedure: SUBMUCOSAL LIFTING INJECTION;  Surgeon: Irving Copas., MD;  Location: [place];  Service: Gastroenterology;;   WISDOM TOOTH EXTRACTION      Allergies  Allergen Reactions   Black Pepper [[place]] Other (See Comments)    bloating   Ciprofloxacin Other (See Comments)    Bleeding-blood in stool    Nsaids     History of bleeding    Outpatient Encounter Medications as of 10/28/2022  Medication Sig   amLODipine (NORVASC) 2.5 MG tablet Take 1 tablet (2.5 mg total) by mouth daily.   b complex vitamins tablet Take 1 tablet by mouth daily.   bimatoprost (LUMIGAN) 0.01 % SOLN Place 1 drop into both eyes at bedtime.   cetirizine (ZYRTEC) 10 MG tablet Take 10 mg by mouth daily as needed for allergies.    Cholecalciferol (VITAMIN D) 2000 UNITS CAPS Take 1 capsule (2,000 Units total) by mouth daily.   COMIRNATY syringe Inject 0.3 mLs into the muscle once.   fluticasone (FLONASE) 50 MCG/ACT nasal spray Place 2 sprays into both nostrils daily as needed for allergies or rhinitis.   folic acid (FOLVITE) Q000111Q MCG tablet Take 800 mcg by mouth daily.    Glucosamine-Chondroit-Vit C-Mn (GLUCOSAMINE CHONDR 1500 COMPLX PO) Take 2 tablets by mouth daily.    omeprazole (PRILOSEC) 40 MG capsule Take 1 capsule (40 mg total) by mouth 2 (two) times daily before a meal.   rosuvastatin (CRESTOR) 5 MG tablet TAKE ONE TABLET BY MOUTH DAILY   timolol (TIMOPTIC) 0.5 % ophthalmic solution Place 1 drop into both eyes in the morning.   vitamin B-12 (CYANOCOBALAMIN) 1000 MCG tablet Take 1,000 mcg by mouth daily.   No facility-administered encounter medications on file as of 10/28/2022.    Review of Systems:  Review of Systems  Constitutional:  Negative for activity change, appetite change and  unexpected weight change.  HENT: Negative.    Respiratory:  Negative for cough and shortness of breath.   Cardiovascular:  Negative for leg swelling.  Gastrointestinal:  Negative for constipation.  Genitourinary:  Negative for frequency.  Musculoskeletal:  Negative for arthralgias, gait problem and myalgias.  Skin: Negative.  Negative for rash.  Neurological:  Negative for dizziness and weakness.  Psychiatric/Behavioral:  Negative for confusion and sleep disturbance.   All other systems reviewed and are negative.   Health Maintenance  Topic Date Due   COVID-19 Vaccine (7 - 2023-24 season) 12/15/2022 (Originally 08/14/2022)   Medicare Annual Wellness (AWV)  07/16/2023   DTaP/Tdap/Td (3 - Td or Tdap) 11/02/2030   Pneumonia Vaccine 74+ Years old  Completed   INFLUENZA VACCINE  Completed   Zoster Vaccines- Shingrix  Completed   HPV VACCINES  Aged Out   COLONOSCOPY (Pts 45-47yr Insurance coverage will need to be confirmed)  Discontinued    Physical Exam: Vitals:   10/28/22 1339  BP: 122/64  Pulse: 88  Resp: 17  Temp: 97.9 F (36.6 C)  TempSrc: Temporal  SpO2: 96%  Weight: 169 lb 3.2 oz (76.7 kg)  Height: 5' 5.5" (1.664 m)   Body mass index is 27.73 kg/m. Physical Exam Vitals reviewed.  Constitutional:      Appearance: Normal appearance.  HENT:     Head: Normocephalic.     Nose: Nose normal.     Mouth/Throat:     Mouth: Mucous membranes are moist.     Pharynx: Oropharynx is clear.  Eyes:     Pupils: Pupils are equal, round, and reactive to light.  Cardiovascular:     Rate and Rhythm: Normal rate and regular rhythm.     Pulses: Normal pulses.     Heart sounds: No murmur heard. Pulmonary:     Effort: Pulmonary effort is normal. No respiratory distress.     Breath sounds: Normal breath sounds. No rales.  Abdominal:     General: Abdomen is flat. Bowel sounds are normal.     Palpations: Abdomen is soft.  Musculoskeletal:     Cervical back: Neck supple.      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  Neurological:     General: No focal deficit present.     Mental Status: He is alert and oriented to person, place, and time.  Psychiatric:        Mood and Affect: Mood normal.        Thought Content: Thought content normal.     Labs reviewed: Basic Metabolic Panel: Recent Labs    03/20/22 0000 09/18/22 0000  NA 139 140  K 4.1 3.9  CL 102 99  CO2 29* 31*  BUN 10 13  CREATININE 1.0 1.0  CALCIUM 9.1 9.1   Liver Function Tests: Recent Labs    09/18/22 0000  AST 73*  ALT 114*  ALKPHOS 124  ALBUMIN 3.8   No results for input(s): "LIPASE", "AMYLASE" in the last 8760 hours. No results for input(s): "AMMONIA" in the last 8760 hours. CBC: Recent Labs    01/07/22 1003 03/20/22 0000 09/18/22 0000  WBC 7.6 5.9 10.2  HGB 15.3 14.7 13.8  HCT 44.5 43 40*  MCV 92.1  --   --   PLT 228.0 229 278   Lipid Panel: Recent Labs    03/20/22 0000 09/18/22 0000  CHOL 151 159  HDL 49 57  LDLCALC 73 82  TRIG 150 101   Lab Results  Component Value Date   HGBA1C 6.1 03/20/2022    Procedures since last visit: No results found.  Assessment/Plan 1. PAD (peripheral artery disease) (HCC) ABI ordered Patient is completely asymptomatic   2. Essential hypertension On Norvasc BP is doing well  3. Elevated LFTs Patient does drink Alcohol at night  Advised again to not take Tylenol  Repeating Labs in 4 weeks  4 Mild Swelling in his legs He does not think it is new On low dose of Norvasc Will follow after ABI  Labs/tests ordered:  * No order type specified * Next appt:  02/24/2023

## 2022-10-31 ENCOUNTER — Telehealth: Payer: Self-pay

## 2022-10-31 NOTE — Telephone Encounter (Signed)
Please route response to Damita Dunnings who works at [facility]. He can further assist with what orders need to be placed for patient.

## 2022-10-31 NOTE — Telephone Encounter (Signed)
Patient has ultrasound placed in chart. However Ian Rowe called from [facility] states patient needs Vascular ultrasound instead of general ultrasound. Message routed to PCP Virgie Dad, MD

## 2022-10-31 NOTE — Telephone Encounter (Signed)
No he does not need Vascular US. I need Arterial Ultrasound for Peripheral Arterial disease

## 2022-11-05 ENCOUNTER — Ambulatory Visit (HOSPITAL_COMMUNITY)
Admission: RE | Admit: 2022-11-05 | Discharge: 2022-11-05 | Disposition: A | Discharge status: Home / Self Care | Payer: Medicare Other | Source: Ambulatory Visit | Attending: Vascular Surgery | Admitting: Vascular Surgery

## 2022-11-05 ENCOUNTER — Ambulatory Visit (HOSPITAL_BASED_OUTPATIENT_CLINIC_OR_DEPARTMENT_OTHER): Admission: RE | Admit: 2022-11-05 | Payer: Medicare Other | Source: Ambulatory Visit

## 2022-11-05 DIAGNOSIS — I739 Peripheral vascular disease, unspecified: Secondary | ICD-10-CM | POA: Diagnosis not present

## 2022-11-06 LAB — VAS US ABI WITH/WO TBI
Left ABI: 1.12
Right ABI: 1.03

## 2022-11-18 DIAGNOSIS — R799 Abnormal finding of blood chemistry, unspecified: Secondary | ICD-10-CM | POA: Diagnosis not present

## 2022-11-18 LAB — HEPATIC FUNCTION PANEL
ALT: 22 U/L (ref 10–40)
AST: 27 (ref 14–40)
Alkaline Phosphatase: 105 (ref 25–125)
Bilirubin, Total: 0.4

## 2022-11-18 LAB — COMPREHENSIVE METABOLIC PANEL: Albumin: 3.9 (ref 3.5–5.0)

## 2022-12-09 ENCOUNTER — Non-Acute Institutional Stay: Payer: Medicare Other | Admitting: Internal Medicine

## 2022-12-09 ENCOUNTER — Encounter: Payer: Self-pay | Admitting: Internal Medicine

## 2022-12-09 VITALS — BP 160/80 | HR 85 | Temp 98.2°F | Resp 17 | Ht 65.5 in | Wt 170.4 lb

## 2022-12-09 DIAGNOSIS — J209 Acute bronchitis, unspecified: Secondary | ICD-10-CM

## 2022-12-09 DIAGNOSIS — K22710 Barrett's esophagus with low grade dysplasia: Secondary | ICD-10-CM

## 2022-12-09 DIAGNOSIS — I10 Essential (primary) hypertension: Secondary | ICD-10-CM

## 2022-12-09 MED ORDER — PREDNISONE 10 MG PO TABS: 10.0000 mg | ORAL_TABLET | Freq: Every day | ORAL | 0 refills | Status: DC

## 2022-12-09 MED ORDER — AZITHROMYCIN 250 MG PO TABS: ORAL_TABLET | ORAL | 0 refills | Status: AC

## 2022-12-09 NOTE — Progress Notes (Signed)
Location: [place] of Service:  Clinic (12)  Provider:   Code Status:  Goals of Care:     12/09/2022    2:08 PM  Advanced Directives  Does Patient Have a Medical Advance Directive? Yes  Type of Advance Directive Living will;Healthcare Power of Attorney  Does patient want to make changes to medical advance directive? No - Patient declined  Copy of [place] in Chart? Yes - validated most recent copy scanned in chart (See row information)     Chief Complaint  Patient presents with   Acute Visit    Patient states since he came back from [place] he has been sick. Has had a temp of 99.2, cough, congestion    HPI: Patient is a 82 y.o. male seen today for an acute visit for Productive cough and Wheezing  [place]  Martin Majestic to [place] for trip with his family Came back and Has had Productive cough with Yellow sputum, Low grade Temp Wheezing  Taking Mucinex Prn  Also Had Flu and Covid This year  BP slightly High today  Other issues Hypertension Doing well with low dose of Norvasc   Barrett's Esophagus S/p Ablation H/o Belching H/o Iron Def Anemia HGB normal now Prediabetes. HGA1C less then 7 Symptoms of BPH Gets up 2 night and Goes 7-8 times during day Does not want to see Urology right now or try any Meds Past Medical History:  Diagnosis Date   Allergy    seasonal   Arthritis    knees   Barrett's esophagus    Cataract    BILATERAL REMOVED   Gastric polyp    GERD (gastroesophageal reflux disease)    Glaucoma    Hemorrhoids    Hyperlipidemia    Hypertension    Pneumonia    as teenager   RBBB    RBBB    Vasovagal syncope    last episode was 2015-16; none since   Wears glasses     Past Surgical History:  Procedure Laterality Date   BIOPSY  01/16/2020   Procedure: BIOPSY;  Surgeon: Irving Copas., MD;  Location: [facility] ENDOSCOPY;  Service: Gastroenterology;;   [place], BILATERAL  2021   COLONOSCOPY  2002; 2007   ENDOSCOPIC MUCOSAL RESECTION N/A 01/16/2020   Procedure: ENDOSCOPIC MUCOSAL RESECTION;  Surgeon: Irving Copas., MD;  Location: [place];  Service: Gastroenterology;  Laterality: N/A;   ENDOSCOPIC MUCOSAL RESECTION N/A 02/03/2022   Procedure: ENDOSCOPIC MUCOSAL RESECTION;  Surgeon: Rush Landmark Telford Nab., MD;  Location: [facility] ENDOSCOPY;  Service: Gastroenterology;  Laterality: N/A;   ESOPHAGOGASTRODUODENOSCOPY (EGD) WITH PROPOFOL N/A 01/16/2020   Procedure: ESOPHAGOGASTRODUODENOSCOPY (EGD) WITH PROPOFOL;  Surgeon: Rush Landmark Telford Nab., MD;  Location: [place];  Service: Gastroenterology;  Laterality: N/A;   ESOPHAGOGASTRODUODENOSCOPY (EGD) WITH PROPOFOL N/A 02/03/2022   Procedure: ESOPHAGOGASTRODUODENOSCOPY (EGD) WITH PROPOFOL;  Surgeon: Rush Landmark Telford Nab., MD;  Location: [facility] ENDOSCOPY;  Service: Gastroenterology;  Laterality: N/A;   GI RADIOFREQUENCY ABLATION  02/03/2022   Procedure: GI RADIOFREQUENCY ABLATION;  Surgeon: Rush Landmark Telford Nab., MD;  Location: [facility] ENDOSCOPY;  Service: Gastroenterology;;   HEMOSTASIS CLIP PLACEMENT  01/16/2020   Procedure: HEMOSTASIS CLIP PLACEMENT;  Surgeon: Irving Copas., MD;  Location: [place];  Service: Gastroenterology;;   HEMOSTASIS CLIP PLACEMENT  02/03/2022   Procedure: HEMOSTASIS CLIP PLACEMENT;  Surgeon: Irving Copas., MD;  Location: [facility]  ENDOSCOPY;  Service: Gastroenterology;;   HEMOSTASIS CONTROL  01/16/2020   Procedure: HEMOSTASIS CONTROL;  Surgeon: Irving Copas., MD;  Location: [place];  Service: Gastroenterology;;   POLYPECTOMY     POLYPECTOMY  02/03/2022   Procedure: POLYPECTOMY;  Surgeon: Irving Copas., MD;  Location: [facility] ENDOSCOPY;  Service: Gastroenterology;;   [place] Ku  02/03/2022   Procedure: [place] Ku;  Surgeon: Mansouraty, Telford Nab., MD;  Location: [facility]  ENDOSCOPY;  Service: Gastroenterology;;  injection of epinephrine into gastric polyp   SUBMUCOSAL LIFTING INJECTION  01/16/2020   Procedure: SUBMUCOSAL LIFTING INJECTION;  Surgeon: Irving Copas., MD;  Location: [place];  Service: Gastroenterology;;   WISDOM TOOTH EXTRACTION      Allergies  Allergen Reactions   Black Pepper [Piper] Other (See Comments)    bloating   Ciprofloxacin Other (See Comments)    Bleeding-blood in stool    Nsaids     History of bleeding    Outpatient Encounter Medications as of 12/09/2022  Medication Sig   amLODipine (NORVASC) 2.5 MG tablet Take 1 tablet (2.5 mg total) by mouth daily.   azithromycin (ZITHROMAX) 250 MG tablet Take 2 tablets on day 1, then 1 tablet daily on days 2 through 5   b complex vitamins tablet Take 1 tablet by mouth daily.   bimatoprost (LUMIGAN) 0.01 % SOLN Place 1 drop into both eyes at bedtime.   cetirizine (ZYRTEC) 10 MG tablet Take 10 mg by mouth daily as needed for allergies.    Cholecalciferol (VITAMIN D) 2000 UNITS CAPS Take 1 capsule (2,000 Units total) by mouth daily.   COMIRNATY syringe Inject 0.3 mLs into the muscle once.   fluticasone (FLONASE) 50 MCG/ACT nasal spray Place 2 sprays into both nostrils daily as needed for allergies or rhinitis.   folic acid (FOLVITE) [id number] MCG tablet Take 800 mcg by mouth daily.    Glucosamine-Chondroit-Vit C-Mn (GLUCOSAMINE CHONDR 1500 COMPLX PO) Take 2 tablets by mouth daily.    omeprazole (PRILOSEC) 40 MG capsule Take 1 capsule (40 mg total) by mouth 2 (two) times daily before a meal.   predniSONE (DELTASONE) 10 MG tablet Take 1 tablet (10 mg total) by mouth daily with breakfast. Take 4 tablets for 3 days then 3 tablets for 3 days then 2 tablets for 3 days then 1 tablet for 3 days then Half tablet for 4 days   rosuvastatin (CRESTOR) 5 MG tablet TAKE ONE TABLET BY MOUTH DAILY   timolol (TIMOPTIC) 0.5 % ophthalmic solution Place 1 drop into both eyes in the morning.   vitamin B-12  (CYANOCOBALAMIN) 1000 MCG tablet Take 1,000 mcg by mouth daily.   No facility-administered encounter medications on file as of 12/09/2022.    Review of Systems:  Review of Systems  Constitutional:  Negative for activity change, appetite change and unexpected weight change.  HENT: Negative.  Negative for sinus pressure.   Respiratory:  Positive for cough and wheezing. Negative for shortness of breath.   Cardiovascular:  Negative for leg swelling.  Gastrointestinal:  Negative for constipation.  Genitourinary:  Negative for frequency.  Musculoskeletal:  Negative for arthralgias, gait problem and myalgias.  Skin: Negative.  Negative for rash.  Neurological:  Negative for dizziness and weakness.  Psychiatric/Behavioral:  Negative for confusion and sleep disturbance.   All other systems reviewed and are negative.   Health Maintenance  Topic Date Due   COVID-19 Vaccine (7 - 2023-24 season) 12/15/2022 (Originally 08/14/2022)   Medicare Annual Wellness (AWV)  07/16/2023   DTaP/Tdap/Td (3 -  Td or Tdap) 11/02/2030   Pneumonia Vaccine 47+ Years old  Completed   INFLUENZA VACCINE  Completed   Zoster Vaccines- Shingrix  Completed   HPV VACCINES  Aged Out   COLONOSCOPY (Pts 45-73yrs Insurance coverage will need to be confirmed)  Discontinued    Physical Exam: Vitals:   12/09/22 1408 12/09/22 1410  BP: (!) 164/80 (!) 160/80  Pulse: 85   Resp: 17   Temp: 98.2 F (36.8 C)   TempSrc: Temporal   SpO2: 97%   Weight: 170 lb 6.4 oz (77.3 kg)   Height: 5' 5.5" (1.664 m)    Body mass index is 27.92 kg/m. Physical Exam Vitals reviewed.  Constitutional:      Appearance: Normal appearance.  HENT:     Head: Normocephalic.     Nose: Nose normal.     Mouth/Throat:     Mouth: Mucous membranes are moist.     Pharynx: Oropharynx is clear.  Eyes:     Pupils: Pupils are equal, round, and reactive to light.  Cardiovascular:     Rate and Rhythm: Normal rate and regular rhythm.     Pulses:  Normal pulses.     Heart sounds: No murmur heard. Pulmonary:     Effort: Pulmonary effort is normal. No respiratory distress.     Breath sounds: Wheezing and rales present.  Abdominal:     General: Abdomen is flat. Bowel sounds are normal.     Palpations: Abdomen is soft.  Musculoskeletal:        General: No swelling.     Cervical back: Neck supple.  Skin:    General: Skin is warm.  Neurological:     General: No focal deficit present.     Mental Status: He is alert and oriented to person, place, and time.  Psychiatric:        Mood and Affect: Mood normal.        Thought Content: Thought content normal.     Labs reviewed: Basic Metabolic Panel: Recent Labs    03/20/22 0000 09/18/22 0000  NA 139 140  K 4.1 3.9  CL 102 99  CO2 29* 31*  BUN 10 13  CREATININE 1.0 1.0  CALCIUM 9.1 9.1   Liver Function Tests: Recent Labs    09/18/22 0000 11/18/22 0000  AST 73* 27  ALT 114* 22  ALKPHOS 124  --   ALBUMIN 3.8 3.9   No results for input(s): "LIPASE", "AMYLASE" in the last 8760 hours. No results for input(s): "AMMONIA" in the last 8760 hours. CBC: Recent Labs    01/07/22 1003 03/20/22 0000 09/18/22 0000  WBC 7.6 5.9 10.2  HGB 15.3 14.7 13.8  HCT 44.5 43 40*  MCV 92.1  --   --   PLT 228.0 229 278   Lipid Panel: Recent Labs    03/20/22 0000 09/18/22 0000  CHOL 151 159  HDL 49 57  LDLCALC 73 82  TRIG 150 101   Lab Results  Component Value Date   HGBA1C 6.1 03/20/2022    Procedures since last visit: No results found.  Assessment/Plan 1. Acute bronchitis, unspecified organism Prednisone Taper 40 mg over 12 days Z pack Call our office if not better  Covid Tested negative  2. Essential hypertension BP High Usually runs in Good Limit  3. LFTS normal now 4. Barrett's esophagus with low grade dysplasia Prilosec    Labs/tests ordered:  * No order type specified * Next appt:  02/24/2023

## 2022-12-17 ENCOUNTER — Telehealth: Payer: Self-pay | Admitting: Internal Medicine

## 2022-12-17 ENCOUNTER — Encounter: Payer: Self-pay | Admitting: Internal Medicine

## 2022-12-17 NOTE — Telephone Encounter (Signed)
A mychart message was sent to patient with the phone number to [facility] and Adult Medicine, Office instructing patient to call and schedule an appointment

## 2022-12-17 NOTE — Telephone Encounter (Signed)
Can he get appointment to see someone in the office tomorrow? Please Call him.Thanks you.

## 2022-12-18 ENCOUNTER — Ambulatory Visit (INDEPENDENT_AMBULATORY_CARE_PROVIDER_SITE_OTHER): Payer: Medicare Other | Admitting: Adult Health

## 2022-12-18 ENCOUNTER — Encounter: Payer: Self-pay | Admitting: Adult Health

## 2022-12-18 VITALS — BP 138/76 | HR 74 | Temp 97.6°F

## 2022-12-18 DIAGNOSIS — J209 Acute bronchitis, unspecified: Secondary | ICD-10-CM | POA: Diagnosis not present

## 2022-12-18 MED ORDER — DM-GUAIFENESIN ER 30-600 MG PO TB12: 1.0000 | ORAL_TABLET | Freq: Two times a day (BID) | ORAL | 0 refills | Status: AC

## 2022-12-18 MED ORDER — ALBUTEROL SULFATE HFA 108 (90 BASE) MCG/ACT IN AERS: 2.0000 | INHALATION_SPRAY | Freq: Four times a day (QID) | RESPIRATORY_TRACT | 0 refills | Status: DC | PRN

## 2022-12-18 NOTE — Patient Instructions (Signed)
  Acute Bronchitis, Adult  Acute bronchitis is when air tubes in the lungs (bronchi) suddenly get swollen. The condition can make it hard for you to breathe. In adults, acute bronchitis usually goes away within 2 weeks. A cough caused by bronchitis may last up to 3 weeks. Smoking, allergies, and asthma can make the condition worse. What are the causes? Germs that cause cold and flu (viruses). The most common cause of this condition is the virus that causes the common cold. Bacteria. Substances that bother (irritate) the lungs, including: Smoke from cigarettes and other types of tobacco. Dust and pollen. Fumes from chemicals, gases, or burned fuel. Indoor or outdoor air pollution. What increases the risk? A weak body's defense system. This is also called the immune system. Any condition that affects your lungs and breathing, such as asthma. What are the signs or symptoms? A cough. Coughing up clear, yellow, or green mucus. Making high-pitched whistling sounds when you breathe, most often when you breathe out (wheezing). Runny or stuffy nose. Having too much mucus in your lungs (chest congestion). Shortness of breath. Body aches. A sore throat. How is this treated? Acute bronchitis may go away over time without treatment. Your doctor may tell you to: Drink more fluids. This will help thin your mucus so it is easier to cough up. Use a device that gets medicine into your lungs (inhaler). Use a vaporizer or a humidifier. These are machines that add water to the air. This helps with coughing and poor breathing. Take a medicine that thins mucus and helps clear it from your lungs. Take a medicine that prevents or stops coughing. It is not common to take an antibiotic medicine for this condition. Follow these instructions at home:  Take over-the-counter and prescription medicines only as told by your doctor. Use an inhaler, vaporizer, or humidifier as told by your doctor. Take two  teaspoons (10 mL) of honey at bedtime. This helps lessen your coughing at night. Drink enough fluid to keep your pee (urine) pale yellow. Do not smoke or use any products that contain nicotine or tobacco. If you need help quitting, ask your doctor. Get a lot of rest. Return to your normal activities when your doctor says that it is safe. Keep all follow-up visits. How is this prevented?  Wash your hands often with soap and water for at least 20 seconds. If you cannot use soap and water, use hand sanitizer. Avoid contact with people who have cold symptoms. Try not to touch your mouth, nose, or eyes with your hands. Avoid breathing in smoke or chemical fumes. Make sure to get the flu shot every year. Contact a doctor if: Your symptoms do not get better in 2 weeks. You have trouble coughing up the mucus. Your cough keeps you awake at night. You have a fever. Get help right away if: You cough up blood. You have chest pain. You have very bad shortness of breath. You faint or keep feeling like you are going to faint. You have a very bad headache. Your fever or chills get worse. These symptoms may be an emergency. Get help right away. Call your local emergency services (911 in the [place] Do not wait to see if the symptoms will go away. Do not drive yourself to the hospital. Summary Acute bronchitis is when air tubes in the lungs (bronchi) suddenly get swollen. In adults, acute bronchitis usually goes away within 2 weeks. Drink more fluids. This will help thin your mucus so it   is easier to cough up. Take over-the-counter and prescription medicines only as told by your doctor. Contact a doctor if your symptoms do not improve after 2 weeks of treatment. This information is not intended to replace advice given to you by your health care provider. Make sure you discuss any questions you have with your health care provider. Document Revised: 01/02/2021 Document Reviewed: 01/02/2021 Elsevier  Patient Education  2023 Elsevier Inc.  

## 2022-12-18 NOTE — Progress Notes (Signed)
[facility] clinic  Provider:  Kenard Gower DNP  Code Status:  Full Code  Goals of Care:     12/09/2022    2:08 PM  Advanced Directives  Does Patient Have a Medical Advance Directive? Yes  Type of Advance Directive Living will;Healthcare Power of Attorney  Does patient want to make changes to medical advance directive? No - Patient declined  Copy of Healthcare Power of Attorney in Chart? Yes - validated most recent copy scanned in chart (See row information)     Chief Complaint  Patient presents with   Acute Visit    Patient presents today for a cough for about 1-2 weeks now. He was seen on 3/26/Ian by PCP and COVID/ flu test was negative.    HPI: Patient is a 82 y.o. [place] seen today for an acute visit for cough. He lives in [place]. He complains of having productive cough with yellowish phlegm for a week now. He was started on Azithromycin and Prednisone for acute bronchitis. He completed the Azithromycin and still taking the Prednisone. He denies fever nor chills. He stated that when he coughs at night, he has wheezing. He denies having SOB.     Past Medical History:  Diagnosis Date   Allergy    seasonal   Arthritis    knees   Barrett's esophagus    Cataract    BILATERAL REMOVED   Gastric polyp    GERD (gastroesophageal reflux disease)    Glaucoma    Hemorrhoids    Hyperlipidemia    Hypertension    Pneumonia    as teenager   RBBB    RBBB    Vasovagal syncope    last episode was 2015-16; none since   Wears glasses     Past Surgical History:  Procedure Laterality Date   BIOPSY  01/16/2020   Procedure: BIOPSY;  Surgeon: Lemar Lofty., MD;  Location: [facility] ENDOSCOPY;  Service: Gastroenterology;;   CARDIAC CATHETERIZATION  1993   CATARACT EXTRACTION W/ INTRAOCULAR LENS  IMPLANT, BILATERAL  2021   COLONOSCOPY  2002; 2007   ENDOSCOPIC MUCOSAL RESECTION N/A 01/16/2020   Procedure: ENDOSCOPIC MUCOSAL RESECTION;  Surgeon:  Lemar Lofty., MD;  Location: [facility] ENDOSCOPY;  Service: Gastroenterology;  Laterality: N/A;   ENDOSCOPIC MUCOSAL RESECTION N/A 02/03/2022   Procedure: ENDOSCOPIC MUCOSAL RESECTION;  Surgeon: Meridee Score Netty Starring., MD;  Location: [facility] ENDOSCOPY;  Service: Gastroenterology;  Laterality: N/A;   ESOPHAGOGASTRODUODENOSCOPY (EGD) WITH PROPOFOL N/A 01/16/2020   Procedure: ESOPHAGOGASTRODUODENOSCOPY (EGD) WITH PROPOFOL;  Surgeon: Meridee Score Netty Starring., MD;  Location: [facility] ENDOSCOPY;  Service: Gastroenterology;  Laterality: N/A;   ESOPHAGOGASTRODUODENOSCOPY (EGD) WITH PROPOFOL N/A 02/03/2022   Procedure: ESOPHAGOGASTRODUODENOSCOPY (EGD) WITH PROPOFOL;  Surgeon: Meridee Score Netty Starring., MD;  Location: [facility] ENDOSCOPY;  Service: Gastroenterology;  Laterality: N/A;   GI RADIOFREQUENCY ABLATION  02/03/2022   Procedure: GI RADIOFREQUENCY ABLATION;  Surgeon: Meridee Score Netty Starring., MD;  Location: [facility] ENDOSCOPY;  Service: Gastroenterology;;   HEMOSTASIS CLIP PLACEMENT  01/16/2020   Procedure: HEMOSTASIS CLIP PLACEMENT;  Surgeon: Lemar Lofty., MD;  Location: [facility] ENDOSCOPY;  Service: Gastroenterology;;   HEMOSTASIS CLIP PLACEMENT  02/03/2022   Procedure: HEMOSTASIS CLIP PLACEMENT;  Surgeon: Lemar Lofty., MD;  Location: [facility] ENDOSCOPY;  Service: Gastroenterology;;   HEMOSTASIS CONTROL  01/16/2020   Procedure: HEMOSTASIS CONTROL;  Surgeon: Lemar Lofty., MD;  Location: [facility] ENDOSCOPY;  Service: Gastroenterology;;   POLYPECTOMY     POLYPECTOMY  02/03/2022   Procedure: POLYPECTOMY;  Surgeon: Meridee Score,  Netty Starring., MD;  Location: [facility] ENDOSCOPY;  Service: Gastroenterology;;   Theresia Majors  02/03/2022   Procedure: Theresia Majors;  Surgeon: Mansouraty, Netty Starring., MD;  Location: [facility] ENDOSCOPY;  Service: Gastroenterology;;  injection of epinephrine into gastric polyp   SUBMUCOSAL LIFTING INJECTION  01/16/2020   Procedure: SUBMUCOSAL LIFTING INJECTION;  Surgeon: Lemar Lofty., MD;  Location:  [facility] ENDOSCOPY;  Service: Gastroenterology;;   WISDOM TOOTH EXTRACTION      Allergies  Allergen Reactions   Black Pepper [Piper] Other (See Comments)    bloating   Ciprofloxacin Other (See Comments)    Bleeding-blood in stool    Nsaids     History of bleeding    Outpatient Encounter Medications as of 12/18/2022  Medication Sig   albuterol (VENTOLIN HFA) 108 (90 Base) MCG/ACT inhaler Inhale 2 puffs into the lungs every 6 (six) hours as needed for wheezing or shortness of breath.   amLODipine (NORVASC) 2.5 MG tablet Take 1 tablet (2.5 mg total) by mouth daily.   b complex vitamins tablet Take 1 tablet by mouth daily.   bimatoprost (LUMIGAN) 0.01 % SOLN Place 1 drop into both eyes at bedtime.   cetirizine (ZYRTEC) 10 MG tablet Take 10 mg by mouth daily as needed for allergies.    Cholecalciferol (VITAMIN D) 2000 UNITS CAPS Take 1 capsule (2,000 Units total) by mouth daily.   COMIRNATY syringe Inject 0.3 mLs into the muscle once.   dextromethorphan-guaiFENesin (MUCINEX DM) 30-600 MG 12hr tablet Take 1 tablet by mouth 2 (two) times daily for 14 days.   fluticasone (FLONASE) 50 MCG/ACT nasal spray Place 2 sprays into both nostrils daily as needed for allergies or rhinitis.   folic acid (FOLVITE) 800 MCG tablet Take 800 mcg by mouth daily.    Glucosamine-Chondroit-Vit C-Mn (GLUCOSAMINE CHONDR 1500 COMPLX PO) Take 2 tablets by mouth daily.    omeprazole (PRILOSEC) 40 MG capsule Take 1 capsule (40 mg total) by mouth 2 (two) times daily before a meal.   predniSONE (DELTASONE) 10 MG tablet Take 1 tablet (10 mg total) by mouth daily with breakfast. Take 4 tablets for 3 days then 3 tablets for 3 days then 2 tablets for 3 days then 1 tablet for 3 days then Half tablet for 4 days   rosuvastatin (CRESTOR) 5 MG tablet TAKE ONE TABLET BY MOUTH DAILY   timolol (TIMOPTIC) 0.5 % ophthalmic solution Place 1 drop into both eyes in the morning.   vitamin B-12 (CYANOCOBALAMIN) 1000 MCG tablet Take 1,000 mcg by  mouth daily.   No facility-administered encounter medications on file as of 12/18/2022.    Review of Systems:  Review of Systems  Constitutional:  Negative for activity change, appetite change and fever.  HENT:  Negative for sore throat.   Eyes: Negative.   Respiratory:  Positive for cough and wheezing. Negative for shortness of breath.        Productive cough with yellowish phlegm  Cardiovascular:  Negative for chest pain and leg swelling.  Gastrointestinal:  Negative for abdominal distention, diarrhea and vomiting.  Genitourinary:  Negative for dysuria, frequency and urgency.  Skin:  Negative for color change.  Neurological:  Negative for dizziness and headaches.  Psychiatric/Behavioral:  Negative for behavioral problems and sleep disturbance. The patient is not nervous/anxious.     Health Maintenance  Topic Date Due   COVID-19 Vaccine (7 - 2023-[place] season) 08/14/2022   INFLUENZA VACCINE  04/16/2023   Medicare Annual Wellness (AWV)  07/16/2023   DTaP/Tdap/Td (3 - Td or  Tdap) 11/02/2030   Pneumonia Vaccine 31+ Years old  Completed   Zoster Vaccines- Shingrix  Completed   HPV VACCINES  Aged Out   COLONOSCOPY (Pts 45-9yrs Insurance coverage will need to be confirmed)  Discontinued    Physical Exam: Vitals:   04/04/Ian 0939  BP: 138/76  Pulse: 74  Temp: 97.6 F (36.4 C)  SpO2: 96%   There is no height or weight on file to calculate BMI. Physical Exam Constitutional:      General: He is not in acute distress. HENT:     Head: Normocephalic and atraumatic.     Mouth/Throat:     Mouth: Mucous membranes are moist.  Eyes:     Conjunctiva/sclera: Conjunctivae normal.  Cardiovascular:     Rate and Rhythm: Normal rate and regular rhythm.     Pulses: Normal pulses.     Heart sounds: Normal heart sounds.  Pulmonary:     Effort: Pulmonary effort is normal.     Breath sounds: Normal breath sounds.  Abdominal:     General: Bowel sounds are normal.     Palpations: Abdomen is  soft.  Musculoskeletal:        General: No swelling. Normal range of motion.     Cervical back: Normal range of motion.  Skin:    General: Skin is warm and dry.  Neurological:     General: No focal deficit present.     Mental Status: He is alert and oriented to person, place, and time.  Psychiatric:        Mood and Affect: Mood normal.        Behavior: Behavior normal.        Thought Content: Thought content normal.        Judgment: Judgment normal.     Labs reviewed: Basic Metabolic Panel: Recent Labs    03/20/22 0000 01/04/Ian 0000  NA 139 140  K 4.1 3.9  CL 102 99  CO2 29* 31*  BUN 10 13  CREATININE 1.0 1.0  CALCIUM 9.1 9.1   Liver Function Tests: Recent Labs    01/04/Ian 0000 03/05/Ian 0000  AST 73* 27  ALT 114* 22  ALKPHOS 124 105  ALBUMIN 3.8 3.9   No results for input(s): "LIPASE", "AMYLASE" in the last 8760 hours. No results for input(s): "AMMONIA" in the last 8760 hours. CBC: Recent Labs    01/07/22 1003 03/20/22 0000 01/04/Ian 0000  WBC 7.6 5.9 10.2  HGB 15.3 14.7 13.8  HCT 44.5 43 40*  MCV 92.1  --   --   PLT 228.0 229 278   Lipid Panel: Recent Labs    03/20/22 0000 01/04/Ian 0000  CHOL 151 159  HDL 49 57  LDLCALC 73 82  TRIG 150 101   Lab Results  Component Value Date   HGBA1C 6.1 03/20/2022    Procedures since last visit: No results found.  Assessment/Plan  1. Acute bronchitis, unspecified organism -  completed Azithromycin  course -  continue Prednisone - dextromethorphan-guaiFENesin (MUCINEX DM) 30-600 MG 12hr tablet; Take 1 tablet by mouth 2 (two) times daily for 14 days.  Dispense: 28 tablet; Refill: 0 - albuterol (VENTOLIN HFA) 108 (90 Base) MCG/ACT inhaler; Inhale 2 puffs into the lungs every 6 (six) hours as needed for wheezing or shortness of breath.  Dispense: 8 g; Refill: 0    Labs/tests ordered:  None  Next appt:  02/24/2023

## 2023-01-13 DIAGNOSIS — H401132 Primary open-angle glaucoma, bilateral, moderate stage: Secondary | ICD-10-CM | POA: Diagnosis not present

## 2023-01-13 DIAGNOSIS — H52203 Unspecified astigmatism, bilateral: Secondary | ICD-10-CM | POA: Diagnosis not present

## 2023-01-13 DIAGNOSIS — Z961 Presence of intraocular lens: Secondary | ICD-10-CM | POA: Diagnosis not present

## 2023-02-17 DIAGNOSIS — R7303 Prediabetes: Secondary | ICD-10-CM | POA: Diagnosis not present

## 2023-02-17 LAB — BASIC METABOLIC PANEL
BUN: 12 (ref 4–21)
CO2: 25 — AB (ref 13–22)
Chloride: 99 (ref 99–108)
Creatinine: 1.1 (ref 0.6–1.3)
Glucose: 94
Potassium: 4.2 mEq/L (ref 3.5–5.1)
Sodium: 139 (ref 137–147)

## 2023-02-17 LAB — COMPREHENSIVE METABOLIC PANEL
Calcium: 9 (ref 8.7–10.7)
eGFR: 68

## 2023-02-17 LAB — HEMOGLOBIN A1C: Hemoglobin A1C: 6.1

## 2023-02-24 ENCOUNTER — Encounter: Payer: Self-pay | Admitting: Internal Medicine

## 2023-02-24 ENCOUNTER — Non-Acute Institutional Stay: Payer: Medicare Other | Admitting: Internal Medicine

## 2023-02-24 VITALS — BP 128/68 | HR 78 | Temp 98.1°F | Resp 18 | Ht 65.5 in | Wt 166.2 lb

## 2023-02-24 DIAGNOSIS — R7303 Prediabetes: Secondary | ICD-10-CM | POA: Diagnosis not present

## 2023-02-24 DIAGNOSIS — I10 Essential (primary) hypertension: Secondary | ICD-10-CM | POA: Diagnosis not present

## 2023-02-24 DIAGNOSIS — K317 Polyp of stomach and duodenum: Secondary | ICD-10-CM | POA: Diagnosis not present

## 2023-02-24 DIAGNOSIS — R600 Localized edema: Secondary | ICD-10-CM

## 2023-02-24 DIAGNOSIS — K22710 Barrett's esophagus with low grade dysplasia: Secondary | ICD-10-CM

## 2023-02-24 NOTE — Progress Notes (Unsigned)
Location:  [facility] [employer] features editor of Service:  Clinic (12)  Provider:   Code Status: *** Goals of Care:     02/24/2023    1:57 PM  Advanced Directives  Does Patient Have a Medical Advance Directive? Yes  Type of Estate agent of [place];Living will  Does patient want to make changes to medical advance directive? No - Patient declined  Copy of Healthcare Power of Attorney in Chart? No - copy requested     Chief Complaint  Patient presents with   Medical Management of Chronic Issues    Patient is here for a 4 month follow up     HPI: Patient is a 82 y.o. male seen today for medical management of chronic diseases.     Past Medical History:  Diagnosis Date   Allergy    seasonal   Arthritis    knees   Barrett's esophagus    Cataract    BILATERAL REMOVED   Gastric polyp    GERD (gastroesophageal reflux disease)    Glaucoma    Hemorrhoids    Hyperlipidemia    Hypertension    Pneumonia    as teenager   RBBB    RBBB    Vasovagal syncope    last episode was 2015-16; none since   Wears glasses     Past Surgical History:  Procedure Laterality Date   BIOPSY  01/16/2020   Procedure: BIOPSY;  Surgeon: Lemar Lofty., MD;  Location: [facility] ENDOSCOPY;  Service: Gastroenterology;;   CARDIAC CATHETERIZATION  1993   CATARACT EXTRACTION W/ INTRAOCULAR LENS  IMPLANT, BILATERAL  2021   COLONOSCOPY  2002; 2007   ENDOSCOPIC MUCOSAL RESECTION N/A 01/16/2020   Procedure: ENDOSCOPIC MUCOSAL RESECTION;  Surgeon: Lemar Lofty., MD;  Location: [facility] ENDOSCOPY;  Service: Gastroenterology;  Laterality: N/A;   ENDOSCOPIC MUCOSAL RESECTION N/A 02/03/2022   Procedure: ENDOSCOPIC MUCOSAL RESECTION;  Surgeon: Meridee Score Netty Starring., MD;  Location: [facility] ENDOSCOPY;  Service: Gastroenterology;  Laterality: N/A;   ESOPHAGOGASTRODUODENOSCOPY (EGD) WITH PROPOFOL N/A 01/16/2020   Procedure: ESOPHAGOGASTRODUODENOSCOPY (EGD) WITH PROPOFOL;  Surgeon:  Meridee Score Netty Starring., MD;  Location: [facility] ENDOSCOPY;  Service: Gastroenterology;  Laterality: N/A;   ESOPHAGOGASTRODUODENOSCOPY (EGD) WITH PROPOFOL N/A 02/03/2022   Procedure: ESOPHAGOGASTRODUODENOSCOPY (EGD) WITH PROPOFOL;  Surgeon: Meridee Score Netty Starring., MD;  Location: [facility] ENDOSCOPY;  Service: Gastroenterology;  Laterality: N/A;   GI RADIOFREQUENCY ABLATION  02/03/2022   Procedure: GI RADIOFREQUENCY ABLATION;  Surgeon: Meridee Score Netty Starring., MD;  Location: [facility] ENDOSCOPY;  Service: Gastroenterology;;   HEMOSTASIS CLIP PLACEMENT  01/16/2020   Procedure: HEMOSTASIS CLIP PLACEMENT;  Surgeon: Lemar Lofty., MD;  Location: [facility] ENDOSCOPY;  Service: Gastroenterology;;   HEMOSTASIS CLIP PLACEMENT  02/03/2022   Procedure: HEMOSTASIS CLIP PLACEMENT;  Surgeon: Lemar Lofty., MD;  Location: [facility] ENDOSCOPY;  Service: Gastroenterology;;   HEMOSTASIS CONTROL  01/16/2020   Procedure: HEMOSTASIS CONTROL;  Surgeon: Lemar Lofty., MD;  Location: [facility] ENDOSCOPY;  Service: Gastroenterology;;   POLYPECTOMY     POLYPECTOMY  02/03/2022   Procedure: POLYPECTOMY;  Surgeon: Lemar Lofty., MD;  Location: [facility] ENDOSCOPY;  Service: Gastroenterology;;   Theresia Majors  02/03/2022   Procedure: Theresia Majors;  Surgeon: Lemar Lofty., MD;  Location: [facility] ENDOSCOPY;  Service: Gastroenterology;;  injection of epinephrine into gastric polyp   SUBMUCOSAL LIFTING INJECTION  01/16/2020   Procedure: SUBMUCOSAL LIFTING INJECTION;  Surgeon: Lemar Lofty., MD;  Location: [facility] ENDOSCOPY;  Service: Gastroenterology;;   WISDOM TOOTH EXTRACTION  Allergies  Allergen Reactions   Black Pepper [[place]] Other (See Comments)    bloating   Ciprofloxacin Other (See Comments)    Bleeding-blood in stool    Nsaids     History of bleeding    Outpatient Encounter Medications as of 02/24/2023  Medication Sig   amLODipine (NORVASC) 2.5 MG tablet Take 1 tablet (2.5 mg total) by mouth daily.   b  complex vitamins tablet Take 1 tablet by mouth daily.   bimatoprost (LUMIGAN) 0.01 % SOLN Place 1 drop into both eyes at bedtime.   cetirizine (ZYRTEC) 10 MG tablet Take 10 mg by mouth daily as needed for allergies.    Cholecalciferol (VITAMIN D) 2000 UNITS CAPS Take 1 capsule (2,000 Units total) by mouth daily.   COMIRNATY syringe Inject 0.3 mLs into the muscle once.   fluticasone (FLONASE) 50 MCG/ACT nasal spray Place 2 sprays into both nostrils daily as needed for allergies or rhinitis.   folic acid (FOLVITE) 800 MCG tablet Take 800 mcg by mouth daily.    Glucosamine-Chondroit-Vit C-Mn (GLUCOSAMINE CHONDR 1500 COMPLX PO) Take 2 tablets by mouth daily.    omeprazole (PRILOSEC) 40 MG capsule Take 1 capsule (40 mg total) by mouth 2 (two) times daily before a meal.   rosuvastatin (CRESTOR) 5 MG tablet TAKE ONE TABLET BY MOUTH DAILY   timolol (TIMOPTIC) 0.5 % ophthalmic solution Place 1 drop into both eyes in the morning.   vitamin B-12 (CYANOCOBALAMIN) 1000 MCG tablet Take 1,000 mcg by mouth daily.   [DISCONTINUED] albuterol (VENTOLIN HFA) 108 (90 Base) MCG/ACT inhaler Inhale 2 puffs into the lungs every 6 (six) hours as needed for wheezing or shortness of breath.   [DISCONTINUED] predniSONE (DELTASONE) 10 MG tablet Take 1 tablet (10 mg total) by mouth daily with breakfast. Take 4 tablets for 3 days then 3 tablets for 3 days then 2 tablets for 3 days then 1 tablet for 3 days then Half tablet for 4 days   No facility-administered encounter medications on file as of 02/24/2023.    Review of Systems:  Review of Systems  Health Maintenance  Topic Date Due   COVID-19 Vaccine (8 - 2023-24 season) 03/04/2023   INFLUENZA VACCINE  04/16/2023   Medicare Annual Wellness (AWV)  07/16/2023   DTaP/Tdap/Td (3 - Td or Tdap) 11/02/2030   Pneumonia Vaccine 69+ Years old  Completed   Zoster Vaccines- Shingrix  Completed   HPV VACCINES  Aged Out   Colonoscopy  Discontinued    Physical Exam: Vitals:    02/24/23 1353  BP: 128/68  Pulse: 78  Resp: 18  Temp: 98.1 F (36.7 C)  TempSrc: Temporal  SpO2: 96%  Weight: 166 lb 3.2 oz (75.4 kg)  Height: 5' 5.5" (1.664 m)   Body mass index is 27.24 kg/m. Physical Exam  Labs reviewed: Basic Metabolic Panel: Recent Labs    03/20/22 0000 09/18/22 0000 02/17/23 0000  NA 139 140 139  K 4.1 3.9 4.2  CL 102 99 99  CO2 29* 31* 25*  BUN 10 13 12   CREATININE 1.0 1.0 1.1  CALCIUM 9.1 9.1 9.0   Liver Function Tests: Recent Labs    09/18/22 0000 11/18/22 0000  AST 73* 27  ALT 114* 22  ALKPHOS 124 105  ALBUMIN 3.8 3.9   No results for input(s): "LIPASE", "AMYLASE" in the last 8760 hours. No results for input(s): "AMMONIA" in the last 8760 hours. CBC: Recent Labs    03/20/22 0000 09/18/22 0000  WBC 5.9 10.2  HGB 14.7 13.8  HCT 43 40*  PLT 229 278   Lipid Panel: Recent Labs    03/20/22 0000 09/18/22 0000  CHOL 151 159  HDL 49 57  LDLCALC 73 82  TRIG 150 101   Lab Results  Component Value Date   HGBA1C 6.1 02/17/2023    Procedures since last visit: No results found.  Assessment/Plan    Labs/tests ordered:  * No order type specified * Next appt:  07/20/2023

## 2023-02-25 ENCOUNTER — Telehealth: Payer: Self-pay | Admitting: Gastroenterology

## 2023-02-25 NOTE — Telephone Encounter (Signed)
Dr Meridee Score and [place] the pt is calling to see if he needs to have another EGD.  I see a recall in the system for 05/2022 but the pt was never contacted.  Have you seen the recall? Does he need to have an office visit?

## 2023-02-25 NOTE — Telephone Encounter (Signed)
Thanks Gabe!

## 2023-02-25 NOTE — Telephone Encounter (Signed)
Does it  need to be in the hospital?

## 2023-02-25 NOTE — Telephone Encounter (Signed)
Going to need RFA. Schedule as EGD with RFA and hospital-based setting. Thanks. GM

## 2023-02-25 NOTE — Telephone Encounter (Signed)
Inbound call from patient stating he had a endoscopy last year. States he never heard anything regarding a follow up visit or when he would be due for another procedure. Requesting a call back to discuss if he needs a follow up office visit. Please advise, thank you.

## 2023-02-25 NOTE — Telephone Encounter (Addendum)
I'm not sure about the recall. Direct procedure OK. Thanks. GM

## 2023-02-26 ENCOUNTER — Encounter (HOSPITAL_COMMUNITY): Payer: Self-pay | Admitting: Gastroenterology

## 2023-02-26 ENCOUNTER — Other Ambulatory Visit: Payer: Self-pay

## 2023-02-26 DIAGNOSIS — K22711 Barrett's esophagus with high grade dysplasia: Secondary | ICD-10-CM

## 2023-02-26 NOTE — Telephone Encounter (Signed)
EGD RFA has been set up for 6/20 at 8 am   Left message on machine to call back

## 2023-02-26 NOTE — Telephone Encounter (Signed)
The pt returned call and has been advised of the appt and instructions. The pt was also sent all information to the home address and MyChart

## 2023-02-26 NOTE — Telephone Encounter (Signed)
He could fit next week with our remaining 1 hour slot on Thursday, if he is interested. Thanks. GM

## 2023-03-04 NOTE — Anesthesia Preprocedure Evaluation (Signed)
Anesthesia Evaluation  Patient identified by MRN, date of birth, ID band Patient awake    Reviewed: Allergy & Precautions, NPO status , Patient's Chart, lab work & pertinent test results  History of Anesthesia Complications Negative for: history of anesthetic complications  Airway Mallampati: II  TM Distance: >3 FB Neck ROM: Full    Dental  (+) Chipped, Dental Advisory Given, Teeth Intact   Pulmonary pneumonia   Pulmonary exam normal breath sounds clear to auscultation       Cardiovascular hypertension, Pt. on medications + CAD  Normal cardiovascular exam+ dysrhythmias  Rhythm:Regular Rate:Normal     Neuro/Psych negative neurological ROS     GI/Hepatic Neg liver ROS,GERD  ,,  Endo/Other  negative endocrine ROS    Renal/GU negative Renal ROS     Musculoskeletal  (+) Arthritis ,    Abdominal   Peds  Hematology  (+) Blood dyscrasia, anemia   Anesthesia Other Findings   Reproductive/Obstetrics                             Anesthesia Physical Anesthesia Plan  ASA: 3  Anesthesia Plan: MAC   Post-op Pain Management: Minimal or no pain anticipated   Induction: Intravenous  PONV Risk Score and Plan: 1 and Propofol infusion, TIVA and Treatment may vary due to age or medical condition  Airway Management Planned: Natural Airway, Nasal Cannula and Simple Face Mask  Additional Equipment: None  Intra-op Plan:   Post-operative Plan:   Informed Consent: I have reviewed the patients History and Physical, chart, labs and discussed the procedure including the risks, benefits and alternatives for the proposed anesthesia with the patient or authorized representative who has indicated his/her understanding and acceptance.     Dental advisory given  Plan Discussed with: CRNA  Anesthesia Plan Comments:         Anesthesia Quick Evaluation

## 2023-03-05 ENCOUNTER — Telehealth: Payer: Self-pay

## 2023-03-05 ENCOUNTER — Ambulatory Visit (HOSPITAL_BASED_OUTPATIENT_CLINIC_OR_DEPARTMENT_OTHER): Payer: Medicare Other | Admitting: Anesthesiology

## 2023-03-05 ENCOUNTER — Encounter (HOSPITAL_COMMUNITY): Payer: Self-pay | Admitting: Gastroenterology

## 2023-03-05 ENCOUNTER — Other Ambulatory Visit: Payer: Self-pay

## 2023-03-05 ENCOUNTER — Ambulatory Visit (HOSPITAL_COMMUNITY): Payer: Medicare Other | Admitting: Anesthesiology

## 2023-03-05 ENCOUNTER — Ambulatory Visit (HOSPITAL_COMMUNITY)
Admission: RE | Admit: 2023-03-05 | Discharge: 2023-03-05 | Disposition: A | Discharge status: Home / Self Care | Payer: Medicare Other | Attending: Gastroenterology | Admitting: Gastroenterology

## 2023-03-05 ENCOUNTER — Encounter (HOSPITAL_COMMUNITY)
Admission: RE | Disposition: A | Discharge status: Home / Self Care | Payer: Self-pay | Source: Home / Self Care | Attending: Gastroenterology

## 2023-03-05 DIAGNOSIS — K22711 Barrett's esophagus with high grade dysplasia: Secondary | ICD-10-CM | POA: Insufficient documentation

## 2023-03-05 DIAGNOSIS — I10 Essential (primary) hypertension: Secondary | ICD-10-CM | POA: Diagnosis not present

## 2023-03-05 DIAGNOSIS — K3189 Other diseases of stomach and duodenum: Secondary | ICD-10-CM | POA: Diagnosis not present

## 2023-03-05 DIAGNOSIS — I2510 Atherosclerotic heart disease of native coronary artery without angina pectoris: Secondary | ICD-10-CM | POA: Insufficient documentation

## 2023-03-05 DIAGNOSIS — K259 Gastric ulcer, unspecified as acute or chronic, without hemorrhage or perforation: Secondary | ICD-10-CM | POA: Diagnosis not present

## 2023-03-05 DIAGNOSIS — K317 Polyp of stomach and duodenum: Secondary | ICD-10-CM | POA: Insufficient documentation

## 2023-03-05 DIAGNOSIS — K449 Diaphragmatic hernia without obstruction or gangrene: Secondary | ICD-10-CM

## 2023-03-05 DIAGNOSIS — K2270 Barrett's esophagus without dysplasia: Secondary | ICD-10-CM | POA: Diagnosis not present

## 2023-03-05 HISTORY — PX: ESOPHAGOGASTRODUODENOSCOPY (EGD) WITH PROPOFOL: SHX5813

## 2023-03-05 HISTORY — PX: ENDOSCOPIC MUCOSAL RESECTION: SHX6839

## 2023-03-05 HISTORY — PX: SUBMUCOSAL LIFTING INJECTION: SHX6855

## 2023-03-05 HISTORY — PX: HEMOSTASIS CLIP PLACEMENT: SHX6857

## 2023-03-05 HISTORY — PX: RADIOFREQUENCY ABLATION: SHX2290

## 2023-03-05 SURGERY — ESOPHAGOGASTRODUODENOSCOPY (EGD) WITH PROPOFOL
Anesthesia: Monitor Anesthesia Care

## 2023-03-05 MED ORDER — ACETAMINOPHEN-CODEINE 120-12 MG/5ML PO SOLN: 10.0000 mL | Freq: Four times a day (QID) | ORAL | 0 refills | Status: DC | PRN

## 2023-03-05 MED ORDER — SODIUM CHLORIDE 0.9 % IV SOLN: INTRAVENOUS | Status: DC

## 2023-03-05 MED ORDER — ACETYLCYSTEINE 20 % IN SOLN
RESPIRATORY_TRACT | Status: AC
  Filled 2023-03-05: qty 4

## 2023-03-05 MED ORDER — PROPOFOL 10 MG/ML IV BOLUS
INTRAVENOUS | Status: DC | PRN
  Administered 2023-03-05: 90 ug/kg/min via INTRAVENOUS

## 2023-03-05 MED ORDER — ACETYLCYSTEINE 20 % IN SOLN
5.0000 mL | Freq: Once | RESPIRATORY_TRACT | Status: AC
  Administered 2023-03-05: 3 mL via ORAL

## 2023-03-05 MED ORDER — PROPOFOL 500 MG/50ML IV EMUL
INTRAVENOUS | Status: AC
  Filled 2023-03-05: qty 50

## 2023-03-05 MED ORDER — LIDOCAINE VISCOUS HCL 2 % MT SOLN: 30.0000 mL | Freq: Four times a day (QID) | OROMUCOSAL | 0 refills | Status: DC

## 2023-03-05 MED ORDER — LIDOCAINE HCL (PF) 2 % IJ SOLN
INTRAMUSCULAR | Status: DC | PRN
  Administered 2023-03-05: 100 mg via INTRADERMAL

## 2023-03-05 MED ORDER — SUCRALFATE 1 GM/10ML PO SUSP: 1.0000 g | Freq: Four times a day (QID) | ORAL | 2 refills | Status: DC

## 2023-03-05 MED ORDER — PROPOFOL 500 MG/50ML IV EMUL
INTRAVENOUS | Status: DC | PRN
  Administered 2023-03-05: 70 mg via INTRAVENOUS

## 2023-03-05 MED ORDER — OMEPRAZOLE 40 MG PO CPDR: 40.0000 mg | DELAYED_RELEASE_CAPSULE | Freq: Two times a day (BID) | ORAL | 4 refills | Status: DC

## 2023-03-05 MED ORDER — LACTATED RINGERS IV SOLN: INTRAVENOUS | Status: DC

## 2023-03-05 SURGICAL SUPPLY — 15 items

## 2023-03-05 NOTE — Op Note (Signed)
[facility] Patient Name: Ian Rowe Procedure Date: 03/05/2023 MRN: [id number] Attending MD: Corliss Parish , MD, [id number] Date of Birth: 04-Aug-1941 CSN: [id number] Age: 82 Admit Type: Outpatient Procedure:                Upper GI endoscopy Indications:              Barrett's esophagus with high grade dysplasia,                            Follow-up of gastric polyps Providers:                Corliss Parish, MD, Zoe Lan, RN, Kandice Robinsons, Technician Referring MD:             Corliss Parish, MD Medicines:                Monitored Anesthesia Care Complications:            No immediate complications. Estimated Blood Loss:     Estimated blood loss was minimal. Procedure:                Pre-Anesthesia Assessment:                           - Prior to the procedure, a History and Physical                            was performed, and patient medications and                            allergies were reviewed. The patient's tolerance of                            previous anesthesia was also reviewed. The risks                            and benefits of the procedure and the sedation                            options and risks were discussed with the patient.                            All questions were answered, and informed consent                            was obtained. Prior Anticoagulants: The patient has                            taken no anticoagulant or antiplatelet agents. ASA                            Grade Assessment: III - A patient with severe  systemic disease. After reviewing the risks and                            benefits, the patient was deemed in satisfactory                            condition to undergo the procedure.                           After obtaining informed consent, the endoscope was                            passed under direct vision. Throughout the                             procedure, the patient's blood pressure, pulse, and                            oxygen saturations were monitored continuously. The                            [id number]-H190 ([id number]) Olympus endoscope was introduced                            through the mouth, and advanced to the second part                            of duodenum. The upper GI endoscopy was                            accomplished without difficulty. The patient                            tolerated the procedure. Scope In: Scope Out: Findings:      No gross lesions were noted in the proximal esophagus and in the mid       esophagus.      The esophagus and gastroesophageal junction were examined with white       light and narrow band imaging (NBI) from a forward view and retroflexed       position. There were esophageal mucosal changes consistent with       short-segment Barrett's esophagus. However, the Barrett's esophagus       looks much improved since our initial RFA treatment.. These changes       involved the mucosa along an irregular Z-line (35 cm from the incisors).       One tongue of salmon-colored mucosa was present from 34 to 35 cm and       scattered islands of salmon-colored mucosa were present from 33 to 34       cm. The maximum longitudinal extent of these esophageal mucosal changes       was 3 cm in length. Focal radiofrequency ablation of Barrett's esophagus       was performed. With the endoscope in place, the position and extent of       the Barrett's mucosa and the anatomic landmarks including proximal and  distal extent of Barrett's mucosa and top of gastric folds were noted.       Endoscopic visualization identified an ablation site including the       entire visible Barrett's segment. The Barrett's mucosa was irrigated       with N-acetylcysteine (Mucomyst) 1% mixed with water. Esophageal       contents were suctioned. Because we had had issues with the passage of       the 60 and  90 RFA catheters in the past due to likely cervical       osteophyte, we decided to move forward with TTS ablation. The       radiofrequency channel ablation catheter was introduced through the       endoscope working channel. Barrett's tissue was targeted. The endoscope       with the ablation catheter was advanced to the areas of Barrett's       mucosa. The areas included islands and tongues of Barrett's mucosa. The       endoscope with the channel ablation catheter was positioned under direct       visualization so that the catheter was placed in contact with the       surface of the Barrett's mucosa. Energy was applied twice at 12 J/cm2.       Ablation was repeated in a likewise fashion to treat the entire area of       suspected Barrett's mucosa. The channel ablation catheter was then       removed through the endoscope working channel, and the ablation catheter       was cleaned. The endoscope was left in place. The ablation zone was       cleaned of coagulative debris. The ablation catheter was reinserted into       the endoscope working channel. A second round of ablation was then       performed. Energy was applied twice at 12 J/cm2 to retreat the areas of       Barrett's epithelium that had been treated with the first series of       ablation. The ablation catheter was removed through the endoscope       working channel. The areas of the esophagus where Barrett's mucosa had       been ablated were examined. Areas of Barrett's esophagus were completely       ablated. The total number of energy applications for all mucosal sites       treated was 74. There was no evidence for perforation. The endoscope was       then removed.      A 4 cm hiatal hernia was present.      Greater than 20, 3 to 20 mm semi-sessile and semipedunculated polyps       with some showing active oozing and other showing stigmata of recent       bleeding were found in the cardia, in the gastric fundus and in the        gastric body. Preparations were made for mucosal resection. Demarcation       of the lesion was performed with high-definition white light and narrow       band imaging to clearly identify the boundaries of the lesion. EverLift       was injected to raise the lesion. Band ligators were placed on 6 of       these. Subsequent snare mucosal resection was performed. Resection and  retrieval were complete. Resected tissue margins were examined and clear       of polyp tissue. To prevent bleeding after mucosal resection, one       hemostatic clip at each site of resection was placed successfully (total       of 6 and these are MR conditional). Clip manufacturer: [employer]. There was no bleeding at the end of the procedure.      Patchy mildly erythematous mucosa without bleeding was found in the       entire examined stomach.      No gross lesions were noted in the duodenal bulb, in the first portion       of the duodenum and in the second portion of the duodenum. Impression:               - No gross lesions in the proximal esophagus and in                            the mid esophagus.                           - Esophageal mucosal changes consistent with                            short-segment Barrett's esophagus as documented                            above (looks improved from prior). Treated with                            radiofrequency ablation as above.                           - 4 cm hiatal hernia.                           - Greater than 20 gastric polyps. 6 of these that                            showed evidence of active oozing/stigmata were                            resected and retrieved. Clips (MR conditional) were                            placed. Clip manufacturer: [employer].                           - Erythematous mucosa in the stomach.                           - No gross lesions in the duodenal bulb, in the                             first portion of the duodenum and in the second  portion of the duodenum. Moderate Sedation:      Not Applicable - Patient had care per Anesthesia. Recommendation:           - The patient will be observed post-procedure,                            until all discharge criteria are met.                           - Discharge patient to home.                           - Patient has a contact number available for                            emergencies. The signs and symptoms of potential                            delayed complications were discussed with the                            patient. Return to normal activities tomorrow.                            Written discharge instructions were provided to the                            patient.                           - Clear liquid diet for 24 hours. Then, full liquid                            diet for 48 hours. Then, soft diet for 1 week                            thereafter. Then advance your diet thereafter to                            regular.                           - Continue your PPI 40 mg twice daily for the next                            3 months.                           - You will be prescribed a lidocaine/antiacid                            mixture that you can take up to 4 times per day as                            needed (prescription has  been sent to your                            pharmacy) - take no matter what for at least first                            3-days.                           - You will be prescribed liquid Tylenol with                            codeine that you can use up to 4 times daily for                            the next 72 hours if needed (prescription to be                            sent to your pharmacy) - I recommend using this                            even if you do not have pain for the first 2-3 days.                           - You will be prescribed  Sucralfate Suspension and                            you will take this 4 times daily (make sure you are                            not taking any other medication 1 hour before or 1                            hour after this medication is taken) to allow                            coating and healing of your esophagus - this needs                            to be taken for at least 65-month (you should have                            refills available but call if you run out).                           - Observe patient's clinical course.                           - Await pathology results.                           - Recommend patient undergo CBC/iron/TIBC/ferritin  to evaluate if he has evidence of recurrent iron                            deficiency. We have removed 6 polyps that had                            active oozing or stigmata of recent bleeding. There                            are more that are present. If he has evidence of                            iron deficiency, then may consider further                            resection of the inflammatory polyps that are                            present as we have done in the past.                           - The findings and recommendations were discussed                            with the patient.                           - The findings and recommendations were discussed                            with the patient's family. Procedure Code(s):        --- Professional ---                           [phone], 59, Esophagogastroduodenoscopy, flexible,                            transoral; with endoscopic mucosal resection                           43270, Esophagogastroduodenoscopy, flexible,                            transoral; with ablation of tumor(s), polyp(s), or                            other lesion(s) (includes pre- and post-dilation                            and guide wire passage, when  performed) Diagnosis Code(s):        --- Professional ---                           K44.9, Diaphragmatic hernia without obstruction or  gangrene                           K31.7, Polyp of stomach and duodenum                           K31.89, Other diseases of stomach and duodenum                           K22.711, Barrett's esophagus with high grade                            dysplasia CPT copyright 2022 [employer]. All rights reserved. The codes documented in this report are preliminary and upon coder review may  be revised to meet current compliance requirements. Corliss Parish, MD 03/05/2023 9:01:52 AM Number of Addenda: 0

## 2023-03-05 NOTE — H&P (Signed)
GASTROENTEROLOGY PROCEDURE H&P NOTE   Primary Care Physician: Ian Gammon, MD  HPI: Ian Rowe is a 82 y.o. male who presents for Barrett's esophagus and history of HGD s/p prior RFA here for surveillance/followup and potential further procedures.  Past Medical History:  Diagnosis Date   Allergy    seasonal   Arthritis    knees   Barrett's esophagus    Cataract    BILATERAL REMOVED   Gastric polyp    GERD (gastroesophageal reflux disease)    Glaucoma    Hemorrhoids    Hyperlipidemia    Hypertension    Pneumonia    as teenager   RBBB    RBBB    Vasovagal syncope    last episode was 2015-16; none since   Wears glasses    Past Surgical History:  Procedure Laterality Date   BIOPSY  01/16/2020   Procedure: BIOPSY;  Surgeon: Ian Rowe., MD;  Location: [facility] ENDOSCOPY;  Service: Gastroenterology;;   CARDIAC CATHETERIZATION  1993   CATARACT EXTRACTION W/ INTRAOCULAR LENS  IMPLANT, BILATERAL  2021   COLONOSCOPY  2002; 2007   ENDOSCOPIC MUCOSAL RESECTION N/A 01/16/2020   Procedure: ENDOSCOPIC MUCOSAL RESECTION;  Surgeon: Ian Rowe., MD;  Location: [facility] ENDOSCOPY;  Service: Gastroenterology;  Laterality: N/A;   ENDOSCOPIC MUCOSAL RESECTION N/A 02/03/2022   Procedure: ENDOSCOPIC MUCOSAL RESECTION;  Surgeon: Ian Rowe., MD;  Location: [facility] ENDOSCOPY;  Service: Gastroenterology;  Laterality: N/A;   ESOPHAGOGASTRODUODENOSCOPY (EGD) WITH PROPOFOL N/A 01/16/2020   Procedure: ESOPHAGOGASTRODUODENOSCOPY (EGD) WITH PROPOFOL;  Surgeon: Ian Rowe., MD;  Location: [facility] ENDOSCOPY;  Service: Gastroenterology;  Laterality: N/A;   ESOPHAGOGASTRODUODENOSCOPY (EGD) WITH PROPOFOL N/A 02/03/2022   Procedure: ESOPHAGOGASTRODUODENOSCOPY (EGD) WITH PROPOFOL;  Surgeon: Ian Rowe., MD;  Location: [facility] ENDOSCOPY;  Service: Gastroenterology;  Laterality: N/A;   GI RADIOFREQUENCY ABLATION  02/03/2022   Procedure: GI RADIOFREQUENCY ABLATION;  Surgeon:  Ian Rowe., MD;  Location: [facility] ENDOSCOPY;  Service: Gastroenterology;;   HEMOSTASIS CLIP PLACEMENT  01/16/2020   Procedure: HEMOSTASIS CLIP PLACEMENT;  Surgeon: Ian Rowe., MD;  Location: [facility] ENDOSCOPY;  Service: Gastroenterology;;   HEMOSTASIS CLIP PLACEMENT  02/03/2022   Procedure: HEMOSTASIS CLIP PLACEMENT;  Surgeon: Ian Rowe., MD;  Location: [facility] ENDOSCOPY;  Service: Gastroenterology;;   HEMOSTASIS CONTROL  01/16/2020   Procedure: HEMOSTASIS CONTROL;  Surgeon: Ian Rowe., MD;  Location: [facility] ENDOSCOPY;  Service: Gastroenterology;;   POLYPECTOMY     POLYPECTOMY  02/03/2022   Procedure: POLYPECTOMY;  Surgeon: Ian Rowe., MD;  Location: [facility] ENDOSCOPY;  Service: Gastroenterology;;   Theresia Majors  02/03/2022   Procedure: Theresia Majors;  Surgeon: Ian Rowe., MD;  Location: [facility] ENDOSCOPY;  Service: Gastroenterology;;  injection of epinephrine into gastric polyp   SUBMUCOSAL LIFTING INJECTION  01/16/2020   Procedure: SUBMUCOSAL LIFTING INJECTION;  Surgeon: Ian Rowe., MD;  Location: [facility] ENDOSCOPY;  Service: Gastroenterology;;   WISDOM TOOTH EXTRACTION     Current Facility-Administered Medications  Medication Dose Route Frequency Provider Last Rate Last Admin   0.9 %  sodium chloride infusion   Intravenous Continuous Ian Rowe., MD       acetylcysteine (MUCOMYST) 20 % nebulizer / oral solution 5 mL  5 mL Oral Once Ian Rowe., MD       lactated ringers infusion   Intravenous Continuous Ian Rowe., MD 10 mL/hr at 03/05/23 0707 New Bag at 03/05/23 0707    Current Facility-Administered Medications:    0.9 %  sodium chloride infusion, , Intravenous, Continuous, Ian Rowe., MD   acetylcysteine (MUCOMYST) 20 % nebulizer / oral solution 5 mL, 5 mL, Oral, Once, Ian Rowe., MD   lactated ringers infusion, , Intravenous, Continuous, Ian Rowe., MD, Last  Rate: 10 mL/hr at 03/05/23 0707, New Bag at 03/05/23 0707 Allergies  Allergen Reactions   Black Pepper [[place]] Other (See Comments)    bloating   Ciprofloxacin Other (See Comments)    Bleeding-blood in stool    Nsaids     History of bleeding   Family History  Problem Relation Age of Onset   Alzheimer's disease Mother    Heart disease Father    Colon cancer Neg Hx    Colon polyps Neg Hx    Esophageal cancer Neg Hx    Stomach cancer Neg Hx    Rectal cancer Neg Hx    Inflammatory bowel disease Neg Hx    Liver disease Neg Hx    Pancreatic cancer Neg Hx    Social History   Socioeconomic History   Marital status: Married    Spouse name: Ian Rowe   Number of children: 2   Years of education: Not on file   Highest education level: Doctorate  Occupational History   Occupation: Retired Interior and spatial designer  Tobacco Use   Smoking status: Never   Smokeless tobacco: Never  Vaping Use   Vaping Use: Never used  Substance and Sexual Activity   Alcohol use: Yes    Alcohol/week: 10.0 standard drinks of alcohol    Types: 7 Shots of liquor, 3 Glasses of wine per week    Comment: 2 scotch at bedtime nightly, occ glass of wine    Drug use: No   Sexual activity: Not on file  Other Topics Concern   Not on file  Social History Narrative   Moved to [facility] 2014.   Married   Never smoked   Alcohol - scotch or wine nightly   Walks 150 minutes per week.    POA   Social Determinants of Health   Financial Resource Strain: Low Risk  (12/17/2022)   Overall Financial Resource Strain (CARDIA)    Difficulty of Paying Living Expenses: Not hard at all  Food Insecurity: No Food Insecurity (12/17/2022)   Hunger Vital Sign    Worried About Running Out of Food in the Last Year: Never true    Ran Out of Food in the Last Year: Never true  Transportation Needs: No Transportation Needs (12/17/2022)   PRAPARE - Administrator, Civil Service (Medical): No    Lack of  Transportation (Non-Medical): No  Physical Activity: Sufficiently Active (12/17/2022)   Exercise Vital Sign    Days of Exercise per Week: 7 days    Minutes of Exercise per Session: 30 min  Stress: No Stress Concern Present (12/17/2022)   [employer] of Occupational Health - Occupational Stress Questionnaire    Feeling of Stress : Not at all  Social Connections: Moderately Isolated (12/17/2022)   Social Connection and Isolation Panel [NHANES]    Frequency of Communication with Friends and Family: Once a week    Frequency of Social Gatherings with Friends and Family: Once a week    Attends Religious Services: Never    Database administrator or Organizations: Yes    Attends Banker Meetings: 1 to 4 times per year    Marital Status: Married  Catering manager Violence: Not At Risk (01/12/2018)   Humiliation, Afraid, Rape,  and Kick questionnaire    Fear of Current or Ex-Partner: No    Emotionally Abused: No    Physically Abused: No    Sexually Abused: No    Physical Exam: Today's Vitals   03/05/23 0703  BP: (!) 161/76  Pulse: 72  Resp: 20  Temp: 97.8 F (36.6 C)  TempSrc: Temporal  SpO2: 100%  Weight: 73.9 kg  Height: 5' 5.5" (1.664 m)  PainSc: 0-No pain   Body mass index is 26.71 kg/m. GEN: NAD EYE: Sclerae anicteric ENT: MMM CV: Non-tachycardic GI: Soft, NT/ND NEURO:  Alert & Oriented x 3  Lab Results: No results for input(s): "WBC", "HGB", "HCT", "PLT" in the last 72 hours. BMET No results for input(s): "NA", "K", "CL", "CO2", "GLUCOSE", "BUN", "CREATININE", "CALCIUM" in the last 72 hours. LFT No results for input(s): "PROT", "ALBUMIN", "AST", "ALT", "ALKPHOS", "BILITOT", "BILIDIR", "IBILI" in the last 72 hours. PT/INR No results for input(s): "LABPROT", "INR" in the last 72 hours.   Impression / Plan: This is a 82 y.o.male who presents for Barrett's esophagus and history of HGD s/p prior RFA here for surveillance/followup and potential further  procedures.  The risks and benefits of endoscopic evaluation/treatment were discussed with the patient and/or family; these include but are not limited to the risk of perforation, infection, bleeding, missed lesions, lack of diagnosis, severe illness requiring hospitalization, as well as anesthesia and sedation related illnesses.  The patient's history has been reviewed, patient examined, no change in status, and deemed stable for procedure.  The patient and/or family is agreeable to proceed.    Corliss Parish, MD [facility] Office # [phone]

## 2023-03-05 NOTE — Discharge Instructions (Signed)
YOU HAD AN ENDOSCOPIC PROCEDURE TODAY: Refer to the procedure report and other information in the discharge instructions given to you for any specific questions about what was found during the examination. If this information does not answer your questions, please call [place] office at [phone] to clarify.   YOU SHOULD EXPECT: Some feelings of bloating in the abdomen. Passage of more gas than usual. Walking can help get rid of the air that was put into your GI tract during the procedure and reduce the bloating. If you had a lower endoscopy (such as a colonoscopy or flexible sigmoidoscopy) you may notice spotting of blood in your stool or on the toilet paper. Some abdominal soreness may be present for a day or two, also.  DIET: Your first meal following the procedure should be a light meal and then it is ok to progress to your normal diet. A half-sandwich or bowl of soup is an example of a good first meal. Heavy or fried foods are harder to digest and may make you feel nauseous or bloated. Drink plenty of fluids but you should avoid alcoholic beverages for 24 hours. If you had a esophageal dilation, please see attached instructions for diet.    ACTIVITY: Your care partner should take you home directly after the procedure. You should plan to take it easy, moving slowly for the rest of the day. You can resume normal activity the day after the procedure however YOU SHOULD NOT DRIVE, use power tools, machinery or perform tasks that involve climbing or major physical exertion for 24 hours (because of the sedation medicines used during the test).   SYMPTOMS TO REPORT IMMEDIATELY: A gastroenterologist can be reached at any hour. Please call [phone]  for any of the following symptoms:  Following lower endoscopy (colonoscopy, flexible sigmoidoscopy) Excessive amounts of blood in the stool  Significant tenderness, worsening of abdominal pains  Swelling of the abdomen that is new, acute  Fever of 100 or  higher  Following upper endoscopy (EGD, EUS, ERCP, esophageal dilation) Vomiting of blood or coffee ground material  New, significant abdominal pain  New, significant chest pain or pain under the shoulder blades  Painful or persistently difficult swallowing  New shortness of breath  Black, tarry-looking or red, bloody stools  FOLLOW UP:  If any biopsies were taken you will be contacted by phone or by letter within the next 1-3 weeks. Call [phone]  if you have not heard about the biopsies in 3 weeks.  Please also call with any specific questions about appointments or follow up tests.YOU HAD AN ENDOSCOPIC PROCEDURE TODAY: Refer to the procedure report and other information in the discharge instructions given to you for any specific questions about what was found during the examination. If this information does not answer your questions, please call [place] office at [phone] to clarify.   YOU SHOULD EXPECT: Some feelings of bloating in the abdomen. Passage of more gas than usual. Walking can help get rid of the air that was put into your GI tract during the procedure and reduce the bloating. If you had a lower endoscopy (such as a colonoscopy or flexible sigmoidoscopy) you may notice spotting of blood in your stool or on the toilet paper. Some abdominal soreness may be present for a day or two, also.  DIET: Your first meal following the procedure should be a light meal and then it is ok to progress to your normal diet. A half-sandwich or bowl of soup is an example of a   good first meal. Heavy or fried foods are harder to digest and may make you feel nauseous or bloated. Drink plenty of fluids but you should avoid alcoholic beverages for 24 hours. If you had a esophageal dilation, please see attached instructions for diet.    ACTIVITY: Your care partner should take you home directly after the procedure. You should plan to take it easy, moving slowly for the rest of the day. You can resume  normal activity the day after the procedure however YOU SHOULD NOT DRIVE, use power tools, machinery or perform tasks that involve climbing or major physical exertion for 24 hours (because of the sedation medicines used during the test).   SYMPTOMS TO REPORT IMMEDIATELY: A gastroenterologist can be reached at any hour. Please call [phone]  for any of the following symptoms:  Following lower endoscopy (colonoscopy, flexible sigmoidoscopy) Excessive amounts of blood in the stool  Significant tenderness, worsening of abdominal pains  Swelling of the abdomen that is new, acute  Fever of 100 or higher  Following upper endoscopy (EGD, EUS, ERCP, esophageal dilation) Vomiting of blood or coffee ground material  New, significant abdominal pain  New, significant chest pain or pain under the shoulder blades  Painful or persistently difficult swallowing  New shortness of breath  Black, tarry-looking or red, bloody stools  FOLLOW UP:  If any biopsies were taken you will be contacted by phone or by letter within the next 1-3 weeks. Call [phone]  if you have not heard about the biopsies in 3 weeks.  Please also call with any specific questions about appointments or follow up tests. 

## 2023-03-05 NOTE — Anesthesia Postprocedure Evaluation (Signed)
Anesthesia Post Note  Patient: Ian Rowe  Procedure(s) Performed: ESOPHAGOGASTRODUODENOSCOPY (EGD) WITH PROPOFOL RADIO FREQUENCY ABLATION ENDOSCOPIC MUCOSAL RESECTION SUBMUCOSAL LIFTING INJECTION HEMOSTASIS CLIP PLACEMENT     Patient location during evaluation: PACU Anesthesia Type: MAC Level of consciousness: awake and alert Pain management: pain level controlled Vital Signs Assessment: post-procedure vital signs reviewed and stable Respiratory status: spontaneous breathing Cardiovascular status: stable Anesthetic complications: no   No notable events documented.  Last Vitals:  Vitals:   03/05/23 0910 03/05/23 0914  BP: 92/74 122/75  Pulse: 62 (!) 59  Resp: (!) 23 17  Temp:    SpO2: 95% 99%    Last Pain:  Vitals:   03/05/23 0853  TempSrc: Temporal  PainSc:                  [name]

## 2023-03-05 NOTE — Telephone Encounter (Signed)
-----   Message from Lemar Lofty., MD sent at 03/05/2023  9:14 AM EDT ----- Regarding: Follow up EGD Ian Rowe, Please recall for 36-month EGD RFA. Thanks. GM

## 2023-03-05 NOTE — Transfer of Care (Signed)
Immediate Anesthesia Transfer of Care Note  Patient: Ian Rowe  Procedure(s) Performed: ESOPHAGOGASTRODUODENOSCOPY (EGD) WITH PROPOFOL RADIO FREQUENCY ABLATION ENDOSCOPIC MUCOSAL RESECTION SUBMUCOSAL LIFTING INJECTION HEMOSTASIS CLIP PLACEMENT  Patient Location: Endoscopy Unit  Anesthesia Type:MAC  Level of Consciousness: drowsy  Airway & Oxygen Therapy: Patient Spontanous Breathing and Patient connected to face mask  Post-op Assessment: Report given to RN and Post -op Vital signs reviewed and stable  Post vital signs: Reviewed and stable  Last Vitals:  Vitals Value Taken Time  BP    Temp    Pulse 67 03/05/23 0852  Resp 23 03/05/23 0852  SpO2 96 % 03/05/23 0852  Vitals shown include unvalidated device data.  Last Pain:  Vitals:   03/05/23 0703  TempSrc: Temporal  PainSc: 0-No pain         Complications: No notable events documented.

## 2023-03-06 ENCOUNTER — Encounter: Payer: Self-pay | Admitting: Gastroenterology

## 2023-03-06 LAB — SURGICAL PATHOLOGY

## 2023-03-09 ENCOUNTER — Encounter (HOSPITAL_COMMUNITY): Payer: Self-pay | Admitting: Gastroenterology

## 2023-03-09 ENCOUNTER — Other Ambulatory Visit: Payer: Self-pay

## 2023-03-09 DIAGNOSIS — Z8639 Personal history of other endocrine, nutritional and metabolic disease: Secondary | ICD-10-CM

## 2023-07-14 DIAGNOSIS — H401123 Primary open-angle glaucoma, left eye, severe stage: Secondary | ICD-10-CM | POA: Diagnosis not present

## 2023-07-14 DIAGNOSIS — H04223 Epiphora due to insufficient drainage, bilateral lacrimal glands: Secondary | ICD-10-CM | POA: Diagnosis not present

## 2023-07-14 DIAGNOSIS — H401112 Primary open-angle glaucoma, right eye, moderate stage: Secondary | ICD-10-CM | POA: Diagnosis not present

## 2023-07-20 ENCOUNTER — Non-Acute Institutional Stay (INDEPENDENT_AMBULATORY_CARE_PROVIDER_SITE_OTHER): Payer: Medicare Other | Admitting: Adult Health

## 2023-07-20 ENCOUNTER — Encounter: Payer: Self-pay | Admitting: Adult Health

## 2023-07-20 VITALS — BP 140/76 | HR 83 | Temp 98.0°F | Resp 17 | Ht 65.5 in | Wt 169.2 lb

## 2023-07-20 DIAGNOSIS — Z0000 Encounter for general adult medical examination without abnormal findings: Secondary | ICD-10-CM

## 2023-07-20 NOTE — Patient Instructions (Signed)
Ian Rowe , Thank you for taking time to come for your Medicare Wellness Visit. I appreciate your ongoing commitment to your health goals. Please review the following plan we discussed and let me know if I can assist you in the future.   Screening recommendations/referrals: Colonoscopy aged out Recommended yearly ophthalmology/optometry visit for glaucoma screening and checkup Recommended yearly dental visit for hygiene and checkup  Vaccinations: Influenza vaccine due annually in September/October Pneumococcal vaccine up to date  Tdap vaccine up to date Shingles vaccine up to date    Advanced directives: reviewed   Conditions/risks identified: cardiac risk   Next appointment: 1 year   Preventive Care 73 Years and Older, Male Preventive care refers to lifestyle choices and visits with your health care provider that can promote health and wellness. What does preventive care include? A yearly physical exam. This is also called an annual well check. Dental exams once or twice a year. Routine eye exams. Ask your health care provider how often you should have your eyes checked. Personal lifestyle choices, including: Daily care of your teeth and gums. Regular physical activity. Eating a healthy diet. Avoiding tobacco and drug use. Limiting alcohol use. Practicing safe sex. Taking low doses of aspirin every day. Taking vitamin and mineral supplements as recommended by your health care provider. What happens during an annual well check? The services and screenings done by your health care provider during your annual well check will depend on your age, overall health, lifestyle risk factors, and family history of disease. Counseling  Your health care provider may ask you questions about your: Alcohol use. Tobacco use. Drug use. Emotional well-being. Home and relationship well-being. Sexual activity. Eating habits. History of falls. Memory and ability to understand  (cognition). Work and work Astronomer. Screening  You may have the following tests or measurements: Height, weight, and BMI. Blood pressure. Lipid and cholesterol levels. These may be checked every 5 years, or more frequently if you are over 18 years old. Skin check. Lung cancer screening. You may have this screening every year starting at age 36 if you have a 30-pack-year history of smoking and currently smoke or have quit within the past 15 years. Fecal occult blood test (FOBT) of the stool. You may have this test every year starting at age 97. Flexible sigmoidoscopy or colonoscopy. You may have a sigmoidoscopy every 5 years or a colonoscopy every 10 years starting at age 79. Prostate cancer screening. Recommendations will vary depending on your family history and other risks. Hepatitis C blood test. Hepatitis B blood test. Sexually transmitted disease (STD) testing. Diabetes screening. This is done by checking your blood sugar (glucose) after you have not eaten for a while (fasting). You may have this done every 1-3 years. Abdominal aortic aneurysm (AAA) screening. You may need this if you are a current or former smoker. Osteoporosis. You may be screened starting at age 67 if you are at high risk. Talk with your health care provider about your test results, treatment options, and if necessary, the need for more tests. Vaccines  Your health care provider may recommend certain vaccines, such as: Influenza vaccine. This is recommended every year. Tetanus, diphtheria, and acellular pertussis (Tdap, Td) vaccine. You may need a Td booster every 10 years. Zoster vaccine. You may need this after age 4. Pneumococcal 13-valent conjugate (PCV13) vaccine. One dose is recommended after age 15. Pneumococcal polysaccharide (PPSV23) vaccine. One dose is recommended after age 63. Talk to your health care provider about which  screenings and vaccines you need and how often you need them. This  information is not intended to replace advice given to you by your health care provider. Make sure you discuss any questions you have with your health care provider. Document Released: 09/28/2015 Document Revised: 05/21/2016 Document Reviewed: 07/03/2015 Elsevier Interactive Patient Education  2017 [employer].  Fall Prevention in the Home Falls can cause injuries. They can happen to people of all ages. There are many things you can do to make your home safe and to help prevent falls. What can I do on the outside of my home? Regularly fix the edges of walkways and driveways and fix any cracks. Remove anything that might make you trip as you walk through a door, such as a raised step or threshold. Trim any bushes or trees on the path to your home. Use bright outdoor lighting. Clear any walking paths of anything that might make someone trip, such as rocks or tools. Regularly check to see if handrails are loose or broken. Make sure that both sides of any steps have handrails. Any raised decks and porches should have guardrails on the edges. Have any leaves, snow, or ice cleared regularly. Use sand or salt on walking paths during winter. Clean up any spills in your garage right away. This includes oil or grease spills. What can I do in the bathroom? Use night lights. Install grab bars by the toilet and in the tub and shower. Do not use towel bars as grab bars. Use non-skid mats or decals in the tub or shower. If you need to sit down in the shower, use a plastic, non-slip stool. Keep the floor dry. Clean up any water that spills on the floor as soon as it happens. Remove soap buildup in the tub or shower regularly. Attach bath mats securely with double-sided non-slip rug tape. Do not have throw rugs and other things on the floor that can make you trip. What can I do in the bedroom? Use night lights. Make sure that you have a light by your bed that is easy to reach. Do not use any sheets or  blankets that are too big for your bed. They should not hang down onto the floor. Have a firm chair that has side arms. You can use this for support while you get dressed. Do not have throw rugs and other things on the floor that can make you trip. What can I do in the kitchen? Clean up any spills right away. Avoid walking on wet floors. Keep items that you use a lot in easy-to-reach places. If you need to reach something above you, use a strong step stool that has a grab bar. Keep electrical cords out of the way. Do not use floor polish or wax that makes floors slippery. If you must use wax, use non-skid floor wax. Do not have throw rugs and other things on the floor that can make you trip. What can I do with my stairs? Do not leave any items on the stairs. Make sure that there are handrails on both sides of the stairs and use them. Fix handrails that are broken or loose. Make sure that handrails are as long as the stairways. Check any carpeting to make sure that it is firmly attached to the stairs. Fix any carpet that is loose or worn. Avoid having throw rugs at the top or bottom of the stairs. If you do have throw rugs, attach them to the floor with carpet  tape. Make sure that you have a light switch at the top of the stairs and the bottom of the stairs. If you do not have them, ask someone to add them for you. What else can I do to help prevent falls? Wear shoes that: Do not have high heels. Have rubber bottoms. Are comfortable and fit you well. Are closed at the toe. Do not wear sandals. If you use a stepladder: Make sure that it is fully opened. Do not climb a closed stepladder. Make sure that both sides of the stepladder are locked into place. Ask someone to hold it for you, if possible. Clearly mark and make sure that you can see: Any grab bars or handrails. First and last steps. Where the edge of each step is. Use tools that help you move around (mobility aids) if they are  needed. These include: Canes. Walkers. Scooters. Crutches. Turn on the lights when you go into a dark area. Replace any light bulbs as soon as they burn out. Set up your furniture so you have a clear path. Avoid moving your furniture around. If any of your floors are uneven, fix them. If there are any pets around you, be aware of where they are. Review your medicines with your doctor. Some medicines can make you feel dizzy. This can increase your chance of falling. Ask your doctor what other things that you can do to help prevent falls. This information is not intended to replace advice given to you by your health care provider. Make sure you discuss any questions you have with your health care provider. Document Released: 06/28/2009 Document Revised: 02/07/2016 Document Reviewed: 10/06/2014 Elsevier Interactive Patient Education  2017 [employer].

## 2023-07-20 NOTE — Progress Notes (Signed)
Subjective:   Ian Rowe is a 82 y.o. male who presents for Medicare Annual/Subsequent preventive examination at [facility].   Visit Complete: In person  Patient Medicare AWV questionnaire was completed by the patient on 07/20/23; I have confirmed that all information answered by patient is correct and no changes since this date.  Cardiac Risk Factors include: advanced age (>16men, >29 women)     Objective:    Today's Vitals   07/20/23 1514 07/20/23 1521  BP: (!) 140/76   Pulse: 83   Resp: 17   Temp: 98 F (36.7 C)   TempSrc: Temporal   SpO2: 99%   Weight: 169 lb 3.2 oz (76.7 kg)   Height: 5' 5.5" (1.664 m)   PainSc:  0-No pain   Body mass index is 27.73 kg/m.     07/20/2023    3:19 PM 03/05/2023    7:00 AM 02/24/2023    1:57 PM 12/09/2022    2:08 PM 10/28/2022    1:41 PM 10/20/2022    2:00 PM 09/16/2022    9:32 AM  Advanced Directives  Does Patient Have a Medical Advance Directive? Yes Yes Yes Yes Yes Yes Yes  Type of Estate agent of [place];Living will Healthcare Power of [place];Living will Healthcare Power of [place];Living will Living will;Healthcare Power of Attorney Living will;Healthcare Power of Attorney Living will;Healthcare Power of Attorney Living will;Healthcare Power of Attorney  Does patient want to make changes to medical advance directive? No - Patient declined  No - Patient declined No - Patient declined No - Patient declined No - Patient declined No - Patient declined  Copy of Healthcare Power of Attorney in Chart? Yes - validated most recent copy scanned in chart (See row information) No - copy requested No - copy requested Yes - validated most recent copy scanned in chart (See row information) Yes - validated most recent copy scanned in chart (See row information) Yes - validated most recent copy scanned in chart (See row information) Yes - validated most recent copy scanned in chart (See row  information)    Current Medications (verified) Outpatient Encounter Medications as of 07/20/2023  Medication Sig   amLODipine (NORVASC) 2.5 MG tablet Take 1 tablet (2.5 mg total) by mouth daily. (Patient taking differently: Take 2.5 mg by mouth every evening.)   b complex vitamins tablet Take 1 tablet by mouth daily.   bimatoprost (LUMIGAN) 0.01 % SOLN Place 1 drop into both eyes at bedtime.   cetirizine (ZYRTEC) 10 MG tablet Take 10 mg by mouth daily as needed for allergies.    Cholecalciferol (VITAMIN D) 2000 UNITS CAPS Take 1 capsule (2,000 Units total) by mouth daily.   fluticasone (FLONASE) 50 MCG/ACT nasal spray Place 2 sprays into both nostrils daily as needed for allergies or rhinitis.   folic acid (FOLVITE) 800 MCG tablet Take 800 mcg by mouth daily.    Glucosamine-Chondroit-Vit C-Mn (GLUCOSAMINE CHONDR 1500 COMPLX PO) Take 2 tablets by mouth daily.    omeprazole (PRILOSEC) 40 MG capsule Take 1 capsule (40 mg total) by mouth 2 (two) times daily before a meal.   rosuvastatin (CRESTOR) 5 MG tablet TAKE ONE TABLET BY MOUTH DAILY   timolol (TIMOPTIC) 0.5 % ophthalmic solution Place 1 drop into both eyes in the morning.   vitamin B-12 (CYANOCOBALAMIN) 1000 MCG tablet Take 1,000 mcg by mouth daily.   acetaminophen-codeine 120-12 MG/5ML solution Take 10 mLs by mouth every 6 (six) hours as needed for  moderate pain. (Patient not taking: Reported on 07/20/2023)   GI Cocktail (alum & mag hydroxide, lidocaine, dicyclomine) oral mixture Take 30 mLs by mouth 4 (four) times daily. 90 ML lidocaine 90 ml 10mg /72ml dicyclomine 270 ml maalox Swish and swallow 30 ml four times daily   sucralfate (CARAFATE) 1 GM/10ML suspension Take 10 mLs (1 g total) by mouth 4 (four) times daily.   No facility-administered encounter medications on file as of 07/20/2023.    Allergies (verified) Black pepper [piper], Ciprofloxacin, and Nsaids   History: Past Medical History:  Diagnosis Date   Allergy    seasonal    Arthritis    knees   Barrett's esophagus    Cataract    BILATERAL REMOVED   Gastric polyp    GERD (gastroesophageal reflux disease)    Glaucoma    Hemorrhoids    Hyperlipidemia    Hypertension    Pneumonia    as teenager   RBBB    RBBB    Vasovagal syncope    last episode was 2015-16; none since   Wears glasses    Past Surgical History:  Procedure Laterality Date   BIOPSY  01/16/2020   Procedure: BIOPSY;  Surgeon: Lemar Lofty., MD;  Location: [facility] ENDOSCOPY;  Service: Gastroenterology;;   CARDIAC CATHETERIZATION  1993   CATARACT EXTRACTION W/ INTRAOCULAR LENS  IMPLANT, BILATERAL  2021   COLONOSCOPY  2002; 2007   ENDOSCOPIC MUCOSAL RESECTION N/A 01/16/2020   Procedure: ENDOSCOPIC MUCOSAL RESECTION;  Surgeon: Lemar Lofty., MD;  Location: [facility] ENDOSCOPY;  Service: Gastroenterology;  Laterality: N/A;   ENDOSCOPIC MUCOSAL RESECTION N/A 02/03/2022   Procedure: ENDOSCOPIC MUCOSAL RESECTION;  Surgeon: Meridee Score Netty Starring., MD;  Location: [facility] ENDOSCOPY;  Service: Gastroenterology;  Laterality: N/A;   ENDOSCOPIC MUCOSAL RESECTION  03/05/2023   Procedure: ENDOSCOPIC MUCOSAL RESECTION;  Surgeon: Meridee Score Netty Starring., MD;  Location: [facility] ENDOSCOPY;  Service: Gastroenterology;;   ESOPHAGOGASTRODUODENOSCOPY (EGD) WITH PROPOFOL N/A 01/16/2020   Procedure: ESOPHAGOGASTRODUODENOSCOPY (EGD) WITH PROPOFOL;  Surgeon: Lemar Lofty., MD;  Location: [facility] ENDOSCOPY;  Service: Gastroenterology;  Laterality: N/A;   ESOPHAGOGASTRODUODENOSCOPY (EGD) WITH PROPOFOL N/A 02/03/2022   Procedure: ESOPHAGOGASTRODUODENOSCOPY (EGD) WITH PROPOFOL;  Surgeon: Meridee Score Netty Starring., MD;  Location: [facility] ENDOSCOPY;  Service: Gastroenterology;  Laterality: N/A;   ESOPHAGOGASTRODUODENOSCOPY (EGD) WITH PROPOFOL N/A 03/05/2023   Procedure: ESOPHAGOGASTRODUODENOSCOPY (EGD) WITH PROPOFOL;  Surgeon: Meridee Score Netty Starring., MD;  Location: [facility] ENDOSCOPY;  Service: Gastroenterology;  Laterality: N/A;   GI  RADIOFREQUENCY ABLATION  02/03/2022   Procedure: GI RADIOFREQUENCY ABLATION;  Surgeon: Meridee Score Netty Starring., MD;  Location: [facility] ENDOSCOPY;  Service: Gastroenterology;;   HEMOSTASIS CLIP PLACEMENT  01/16/2020   Procedure: HEMOSTASIS CLIP PLACEMENT;  Surgeon: Lemar Lofty., MD;  Location: [facility] ENDOSCOPY;  Service: Gastroenterology;;   HEMOSTASIS CLIP PLACEMENT  02/03/2022   Procedure: HEMOSTASIS CLIP PLACEMENT;  Surgeon: Lemar Lofty., MD;  Location: [facility] ENDOSCOPY;  Service: Gastroenterology;;   HEMOSTASIS CLIP PLACEMENT  03/05/2023   Procedure: HEMOSTASIS CLIP PLACEMENT;  Surgeon: Lemar Lofty., MD;  Location: [facility] ENDOSCOPY;  Service: Gastroenterology;;   HEMOSTASIS CONTROL  01/16/2020   Procedure: HEMOSTASIS CONTROL;  Surgeon: Lemar Lofty., MD;  Location: [facility] ENDOSCOPY;  Service: Gastroenterology;;   POLYPECTOMY     POLYPECTOMY  02/03/2022   Procedure: POLYPECTOMY;  Surgeon: Lemar Lofty., MD;  Location: [facility] ENDOSCOPY;  Service: Gastroenterology;;   RADIOFREQUENCY ABLATION  03/05/2023   Procedure: RADIO FREQUENCY ABLATION;  Surgeon: Lemar Lofty., MD;  Location: [facility] ENDOSCOPY;  Service: Gastroenterology;;  SCHLEROTHERAPY  02/03/2022   Procedure: Theresia Majors;  Surgeon: Mansouraty, Netty Starring., MD;  Location: [facility] ENDOSCOPY;  Service: Gastroenterology;;  injection of epinephrine into gastric polyp   SUBMUCOSAL LIFTING INJECTION  01/16/2020   Procedure: SUBMUCOSAL LIFTING INJECTION;  Surgeon: Lemar Lofty., MD;  Location[facility] ENDOSCOPY;  Service: Gastroenterology;;   Brooke Dare INJECTION  03/05/2023   Procedure: SUBMUCOSAL LIFTING INJECTION;  Surgeon: Lemar Lofty., MD;  Location: [facility] ENDOSCOPY;  Service: Gastroenterology;;   WISDOM TOOTH EXTRACTION     Family History  Problem Relation Age of Onset   Alzheimer's disease Mother    Heart disease Father    Colon cancer Neg Hx    Colon polyps Neg Hx    Esophageal cancer Neg  Hx    Stomach cancer Neg Hx    Rectal cancer Neg Hx    Inflammatory bowel disease Neg Hx    Liver disease Neg Hx    Pancreatic cancer Neg Hx    Social History   Socioeconomic History   Marital status: Married    Spouse name: [name]   Number of children: 2   Years of education: Not on file   Highest education level: Doctorate  Occupational History   Occupation: Retired Interior and spatial designer  Tobacco Use   Smoking status: Never   Smokeless tobacco: Never  Vaping Use   Vaping status: Never Used  Substance and Sexual Activity   Alcohol use: Yes    Alcohol/week: 10.0 standard drinks of alcohol    Types: 7 Shots of liquor, 3 Glasses of wine per week    Comment: 2 scotch at bedtime nightly, occ glass of wine    Drug use: No   Sexual activity: Not on file  Other Topics Concern   Not on file  Social History Narrative   Moved to [facility] 2014.   Married   Never smoked   Alcohol - scotch or wine nightly   Walks 150 minutes per week.    POA   Social Determinants of Health   Financial Resource Strain: Low Risk  (12/17/2022)   Overall Financial Resource Strain (CARDIA)    Difficulty of Paying Living Expenses: Not hard at all  Food Insecurity: No Food Insecurity (12/17/2022)   Hunger Vital Sign    Worried About Running Out of Food in the Last Year: Never true    Ran Out of Food in the Last Year: Never true  Transportation Needs: No Transportation Needs (12/17/2022)   PRAPARE - Administrator, Civil Service (Medical): No    Lack of Transportation (Non-Medical): No  Physical Activity: Sufficiently Active (12/17/2022)   Exercise Vital Sign    Days of Exercise per Week: 7 days    Minutes of Exercise per Session: 30 min  Stress: No Stress Concern Present (12/17/2022)   [employer] of Occupational Health - Occupational Stress Questionnaire    Feeling of Stress : Not at all  Social Connections: Moderately Isolated (12/17/2022)   Social Connection and  Isolation Panel [NHANES]    Frequency of Communication with Friends and Family: Once a week    Frequency of Social Gatherings with Friends and Family: Once a week    Attends Religious Services: Never    Database administrator or Organizations: Yes    Attends Banker Meetings: 1 to 4 times per year    Marital Status: Married    Tobacco Counseling Counseling given: Not Answered   Clinical Intake:     Pain : No/denies  pain Pain Score: 0-No pain     BMI - recorded: 27.73 Nutritional Status: BMI 25 -29 Overweight Nutritional Risks: None Diabetes: No  How often do you need to have someone help you when you read instructions, pamphlets, or other written materials from your doctor or pharmacy?: 1 - Never What is the last grade level you completed in school?: phd  Interpreter Needed?: No      Activities of Daily Living    07/20/2023    3:47 PM 07/20/2023    3:20 PM  In your present state of health, do you have any difficulty performing the following activities:  Hearing? 0 0  Vision? 1 0  Difficulty concentrating or making decisions? 0 0  Walking or climbing stairs? 1 0  Dressing or bathing? 0 0  Doing errands, shopping? 0 0  Preparing Food and eating ? N   Using the Toilet? N   In the past six months, have you accidently leaked urine? N   Do you have problems with loss of bowel control? N   Managing your Medications? N   Managing your Finances? N   Housekeeping or managing your Housekeeping? N     Patient Care Team: Mahlon Gammon, MD as PCP - General (Internal Medicine) [facility] Armbruster, Willaim Rayas, MD as Consulting Physician (Gastroenterology) Mansouraty, Netty Starring., MD as Consulting Physician (Gastroenterology)  Indicate any recent Medical Services you may have received from other than [facility] providers in the past year (date may be approximate).     Assessment:   This is a routine wellness examination for  Hrithik.  Hearing/Vision screen Hearing Screening - Comments:: NO Vision Screening - Comments:: YES LAST WEEK    Goals Addressed             This Visit's Progress    Caregiver Coping Optimized       Evidence-based guidance:  Recognize and validate the complex nature of dementia, as well as the impact on the caregiver and extended family; provide education and support to align with needs and stage of dementia.  Acknowledge feelings of grief associated with dementia such as loss of relationship, recreational activities, future with patient and loss associated with out-of-home placement.  Encourage verbalization of feelings without ruminating.  Discuss services that may help balance the caregiving role and living the life he/she chooses such as professionally or peer-led support groups, web-based support, counseling and services such as respite care or in-home help.  Refer for or provide psychoeducation activities such as cognitive reframing to improve family/caregiver quality of life, wellbeing, confidence, perception of burden, mental health and self-efficacy.  Encourage use of individualized coping strategies that may include yoga, spiritual support, distraction, exercise, relaxation, music, massage, music therapy and outdoor activities to utilize nature's restorative properties.  Suggest home-monitoring system that may reduce family/caregiver worry and improve sleep.  Provide proactive acknowledgement of potential for depressive symptoms to appear; normalize response to burden of caregiving; refer to primary care provider or for mental health services.   Notes:        Depression Screen    07/20/2023    3:19 PM 02/24/2023    1:57 PM 10/28/2022    1:41 PM 10/20/2022    2:00 PM 09/09/2022   11:20 AM 08/04/2022    2:03 PM 07/21/2022    1:47 PM  PHQ 2/9 Scores  PHQ - 2 Score 0 0 0 0 0 0 0    Fall Risk    07/20/2023  3:19 PM 02/24/2023    1:57 PM 12/09/2022    2:07 PM 10/28/2022     1:41 PM 10/20/2022    2:00 PM  Fall Risk   Falls in the past year? 0 0 0 0 0  Number falls in past yr: 0 0 0 0 0  Injury with Fall? 0 0 0 0 0  Risk for fall due to : No Fall Risks No Fall Risks No Fall Risks  No Fall Risks  Follow up Falls evaluation completed Falls evaluation completed Falls evaluation completed Falls evaluation completed     MEDICARE RISK AT HOME: Medicare Risk at Home Any stairs in or around the home?: No If so, are there any without handrails?: No Home free of loose throw rugs in walkways, pet beds, electrical cords, etc?: Yes Adequate lighting in your home to reduce risk of falls?: Yes Life alert?: No Use of a cane, walker or w/c?: No Grab bars in the bathroom?: Yes Shower chair or bench in shower?: Yes Elevated toilet seat or a handicapped toilet?: Yes  TIMED UP AND GO:  Was the test performed?  Yes  Length of time to ambulate 10 feet: 12 sec Gait steady and fast without use of assistive device    Cognitive Function:    07/20/2023    3:21 PM 07/20/2023    3:20 PM 01/12/2018   10:14 AM 12/31/2016   10:20 AM 06/27/2015   10:27 AM  MMSE - Mini Mental State Exam  Orientation to time 5 5 5 5 5   Orientation to Place 5  5 5 5   Registration 3  3 3 3   Attention/ Calculation 5  5 5 5   Recall 3  3 3 3   Language- name 2 objects 2  2 2 2   Language- repeat 1  1 1 1   Language- follow 3 step command 3  3 3 3   Language- read & follow direction 1  1 1 1   Write a sentence 1  1 1 1   Copy design 1  1 1 1   Total score 30  30 30 30         07/15/2022    9:38 AM 07/11/2021   11:04 AM 07/10/2020   10:00 AM 07/07/2019   10:48 AM  6CIT Screen  What Year? 0 points 0 points 0 points 0 points  What month? 0 points 0 points 0 points 0 points  What time? 0 points 0 points 0 points 0 points  Count back from 20 0 points 0 points 0 points 0 points  Months in reverse 0 points 0 points 0 points 0 points  Repeat phrase 0 points 2 points 0 points 0 points  Total Score 0  points 2 points 0 points 0 points    Immunizations Immunization History  Administered Date(s) Administered   Fluad Quad(high Dose 65+) 06/20/2021, 06/18/2023   Influenza Whole 06/15/2012, 06/15/2013   Influenza, High Dose Seasonal PF 06/19/2016, 07/13/2020, 06/19/2022   Influenza, Quadrivalent, Recombinant, Inj, Pf 06/16/2019   Influenza,inj,Quad PF,6+ Mos 06/15/2018   Influenza-Unspecified 06/14/2014, 06/21/2015, 06/30/2017, 06/28/2020   Moderna Covid-19 Vaccine Bivalent Booster 67yrs & up 01/15/2022, 07/07/2023   Moderna SARS-COV2 Booster Vaccination 01/28/2021   Moderna Sars-Covid-2 Vaccination 09/27/2019, 10/26/2019, 07/31/2020, 06/28/2021   Pfizer Covid-19 Vaccine Bivalent Booster 55yrs & up 01/07/2023   Pneumococcal Conjugate-13 10/31/2014   Pneumococcal Polysaccharide-23 09/16/2011   Tdap 12/08/2013, 11/02/2020   Unspecified SARS-COV-2 Vaccination 06/28/2021, 06/19/2022   Zoster Recombinant(Shingrix) 08/25/2017, 11/02/2017    TDAP status: Up  to date  Flu Vaccine status: Up to date  Pneumococcal vaccine status: Up to date  Covid-19 vaccine status: Completed vaccines  Qualifies for Shingles Vaccine? Yes   Zostavax completed No   Shingrix Completed?: Yes  Screening Tests Health Maintenance  Topic Date Due   COVID-19 Vaccine (9 - 2023-24 season) 09/01/2023   Medicare Annual Wellness (AWV)  07/19/2024   DTaP/Tdap/Td (3 - Td or Tdap) 11/02/2030   Pneumonia Vaccine 70+ Years old  Completed   INFLUENZA VACCINE  Completed   Zoster Vaccines- Shingrix  Completed   HPV VACCINES  Aged Out   Colonoscopy  Discontinued    Health Maintenance  There are no preventive care reminders to display for this patient.   Colorectal cancer screening: No longer required.   Lung Cancer Screening: (Low Dose CT Chest recommended if Age 8-80 years, 20 pack-year currently smoking OR have quit w/in 15years.) does not qualify.   Lung Cancer Screening Referral: NA  Additional  Screening:  Hepatitis C Screening: does not qualify; Completed NA  Vision Screening: Recommended annual ophthalmology exams for early detection of glaucoma and other disorders of the eye. Is the patient up to date with their annual eye exam?  Yes  Who is the provider or what is the name of the office in which the patient attends annual eye exams?  [facility] If pt is not established with a provider, would they like to be referred to a provider to establish care? No .   Dental Screening: Recommended annual dental exams for proper oral hygiene  Diabetic Foot Exam: NA Community Resource Referral / Chronic Care Management: CRR required this visit?  No   CCM required this visit?  No     Plan:     I have personally reviewed and noted the following in the patient's chart:   Medical and social history Use of alcohol, tobacco or illicit drugs  Current medications and supplements including opioid prescriptions. Patient is not currently taking opioid prescriptions. Functional ability and status Nutritional status Physical activity Advanced directives List of other physicians Hospitalizations, surgeries, and ER visits in previous 12 months Vitals Screenings to include cognitive, depression, and falls Referrals and appointments  In addition, I have reviewed and discussed with patient certain preventive protocols, quality metrics, and best practice recommendations. A written personalized care plan for preventive services as well as general preventive health recommendations were provided to patient.     Fletcher Anon, NP   07/20/2023   After Visit Summary: Given to patient  Nurse Notes: NA

## 2023-08-18 DIAGNOSIS — R7303 Prediabetes: Secondary | ICD-10-CM | POA: Diagnosis not present

## 2023-08-18 DIAGNOSIS — E785 Hyperlipidemia, unspecified: Secondary | ICD-10-CM | POA: Diagnosis not present

## 2023-08-18 LAB — HEPATIC FUNCTION PANEL
ALT: 20 U/L (ref 10–40)
AST: 26 (ref 14–40)
Alkaline Phosphatase: 89 (ref 25–125)
Bilirubin, Total: 0.5

## 2023-08-18 LAB — TSH: TSH: 2.72 (ref 0.41–5.90)

## 2023-08-18 LAB — CBC AND DIFFERENTIAL
HCT: 43 (ref 41–53)
Hemoglobin: 14.9 (ref 13.5–17.5)
Platelets: 250 10*3/uL (ref 150–400)
WBC: 7.1

## 2023-08-18 LAB — BASIC METABOLIC PANEL
BUN: 14 (ref 4–21)
CO2: 26 — AB (ref 13–22)
Chloride: 102 (ref 99–108)
Creatinine: 1.1 (ref 0.6–1.3)
Glucose: 103
Potassium: 4 meq/L (ref 3.5–5.1)
Sodium: 141 (ref 137–147)

## 2023-08-18 LAB — LIPID PANEL
Cholesterol: 153 (ref 0–200)
HDL: 46 (ref 35–70)
LDL Cholesterol: 70
Triglycerides: 187 — AB (ref 40–160)

## 2023-08-18 LAB — HEMOGLOBIN A1C: Hemoglobin A1C: 6.2

## 2023-08-18 LAB — COMPREHENSIVE METABOLIC PANEL
Albumin: 3.9 (ref 3.5–5.0)
Calcium: 9.2 (ref 8.7–10.7)
eGFR: 67

## 2023-08-18 LAB — CBC: RBC: 4.61 (ref 3.87–5.11)

## 2023-08-31 ENCOUNTER — Encounter: Payer: Self-pay | Admitting: Adult Health

## 2023-08-31 ENCOUNTER — Non-Acute Institutional Stay: Payer: Medicare Other | Admitting: Adult Health

## 2023-08-31 VITALS — BP 156/80 | HR 79 | Temp 97.8°F | Resp 17 | Ht 65.5 in | Wt 172.0 lb

## 2023-08-31 DIAGNOSIS — I10 Essential (primary) hypertension: Secondary | ICD-10-CM

## 2023-08-31 DIAGNOSIS — H409 Unspecified glaucoma: Secondary | ICD-10-CM | POA: Diagnosis not present

## 2023-08-31 DIAGNOSIS — R7303 Prediabetes: Secondary | ICD-10-CM | POA: Diagnosis not present

## 2023-08-31 DIAGNOSIS — E782 Mixed hyperlipidemia: Secondary | ICD-10-CM

## 2023-08-31 DIAGNOSIS — K219 Gastro-esophageal reflux disease without esophagitis: Secondary | ICD-10-CM | POA: Diagnosis not present

## 2023-08-31 DIAGNOSIS — R200 Anesthesia of skin: Secondary | ICD-10-CM | POA: Diagnosis not present

## 2023-08-31 NOTE — Patient Instructions (Signed)
Please have the clinic nurse recheck your blood pressure and let us know if it is >/= 150/90

## 2023-08-31 NOTE — Progress Notes (Unsigned)
Location:  [facility]  POS: Clinic  Provider: Fletcher Anon, ANP  Code Status:  Goals of Care:     08/31/2023    1:28 PM  Advanced Directives  Does Patient Have a Medical Advance Directive? Yes  Type of Estate agent of [place];Living will  Does patient want to make changes to medical advance directive? No - Patient declined  Copy of Healthcare Power of Attorney in Chart? Yes - validated most recent copy scanned in chart (See row information)     Chief Complaint  Patient presents with   Medical Management of Chronic Issues    Patient is being seen for a 3 month follow up.    HPI: Patient is a 82 y.o. male seen today for medical management of chronic diseases.    PMH significant for Barrett's esophagus, RBBB, aortic atherosclerosis, GERD, gastric polyps, right shoulder bursitis, BPH, DDD, OA, Vasovagal syncope, vid d def, gout, iron def, glaucoma, anemia, hyperglycemia.   Lives in [place] with his wife that has Parkinson's disease. He is noticing more memory issues which can be stressful.   Reports some left 5th digit numbness.   Endoscopy 03/05/23: Barrett's esophagus, multiple gastric polyps, hiatal hernia. Prescribed PPI.   IDA: Hgb 14.9 08/18/23  Exercise: Walk 30 minutes a day  See Dr Charlotte Sanes for glaucoma reports stable condition, still driving.     HTN 156/80  Hx of BPH gets up twice at night to urinate, no issues with stream or hesitancy  HLD: on crestor Lab Results  Component Value Date   CHOL 153 08/18/2023   HDL 46 08/18/2023   LDLCALC 70 08/18/2023   TRIG 187 (A) 08/18/2023     Prediabetes.  Lab Results  Component Value Date   HGBA1C 6.1 02/17/2023     Past Medical History:  Diagnosis Date   Allergy    seasonal   Arthritis    knees   Barrett's esophagus    Cataract    BILATERAL REMOVED   Gastric polyp    GERD (gastroesophageal reflux disease)    Glaucoma    Hemorrhoids    Hyperlipidemia    Hypertension     Pneumonia    as teenager   RBBB    RBBB    Vasovagal syncope    last episode was 2015-16; none since   Wears glasses     Past Surgical History:  Procedure Laterality Date   BIOPSY  01/16/2020   Procedure: BIOPSY;  Surgeon: Lemar Lofty., MD;  Location: [facility] ENDOSCOPY;  Service: Gastroenterology;;   CARDIAC CATHETERIZATION  1993   CATARACT EXTRACTION W/ INTRAOCULAR LENS  IMPLANT, BILATERAL  2021   COLONOSCOPY  2002; 2007   ENDOSCOPIC MUCOSAL RESECTION N/A 01/16/2020   Procedure: ENDOSCOPIC MUCOSAL RESECTION;  Surgeon: Lemar Lofty., MD;  Location: [facility] ENDOSCOPY;  Service: Gastroenterology;  Laterality: N/A;   ENDOSCOPIC MUCOSAL RESECTION N/A 02/03/2022   Procedure: ENDOSCOPIC MUCOSAL RESECTION;  Surgeon: Meridee Score Netty Starring., MD;  Location: [facility] ENDOSCOPY;  Service: Gastroenterology;  Laterality: N/A;   ENDOSCOPIC MUCOSAL RESECTION  03/05/2023   Procedure: ENDOSCOPIC MUCOSAL RESECTION;  Surgeon: Meridee Score Netty Starring., MD;  Location: [facility] ENDOSCOPY;  Service: Gastroenterology;;   ESOPHAGOGASTRODUODENOSCOPY (EGD) WITH PROPOFOL N/A 01/16/2020   Procedure: ESOPHAGOGASTRODUODENOSCOPY (EGD) WITH PROPOFOL;  Surgeon: Lemar Lofty., MD;  Location: [facility] ENDOSCOPY;  Service: Gastroenterology;  Laterality: N/A;   ESOPHAGOGASTRODUODENOSCOPY (EGD) WITH PROPOFOL N/A 02/03/2022   Procedure: ESOPHAGOGASTRODUODENOSCOPY (EGD) WITH PROPOFOL;  Surgeon: Meridee Score Netty Starring., MD;  Location: [facility] ENDOSCOPY;  Service: Gastroenterology;  Laterality: N/A;   ESOPHAGOGASTRODUODENOSCOPY (EGD) WITH PROPOFOL N/A 03/05/2023   Procedure: ESOPHAGOGASTRODUODENOSCOPY (EGD) WITH PROPOFOL;  Surgeon: Meridee Score Netty Starring., MD;  Location: [facility] ENDOSCOPY;  Service: Gastroenterology;  Laterality: N/A;   GI RADIOFREQUENCY ABLATION  02/03/2022   Procedure: GI RADIOFREQUENCY ABLATION;  Surgeon: Meridee Score Netty Starring., MD;  Location: [facility] ENDOSCOPY;  Service: Gastroenterology;;   HEMOSTASIS CLIP PLACEMENT  01/16/2020    Procedure: HEMOSTASIS CLIP PLACEMENT;  Surgeon: Lemar Lofty., MD;  Location: [facility] ENDOSCOPY;  Service: Gastroenterology;;   HEMOSTASIS CLIP PLACEMENT  02/03/2022   Procedure: HEMOSTASIS CLIP PLACEMENT;  Surgeon: Lemar Lofty., MD;  Location: [facility] ENDOSCOPY;  Service: Gastroenterology;;   HEMOSTASIS CLIP PLACEMENT  03/05/2023   Procedure: HEMOSTASIS CLIP PLACEMENT;  Surgeon: Lemar Lofty., MD;  Location: [facility] ENDOSCOPY;  Service: Gastroenterology;;   HEMOSTASIS CONTROL  01/16/2020   Procedure: HEMOSTASIS CONTROL;  Surgeon: Lemar Lofty., MD;  Location: [facility] ENDOSCOPY;  Service: Gastroenterology;;   POLYPECTOMY     POLYPECTOMY  02/03/2022   Procedure: POLYPECTOMY;  Surgeon: Lemar Lofty., MD;  Location: [facility] ENDOSCOPY;  Service: Gastroenterology;;   RADIOFREQUENCY ABLATION  03/05/2023   Procedure: RADIO FREQUENCY ABLATION;  Surgeon: Lemar Lofty., MD;  Location: [facility] ENDOSCOPY;  Service: Gastroenterology;;   Theresia Majors  02/03/2022   Procedure: Theresia Majors;  Surgeon: Lemar Lofty., MD;  Location: [facility] ENDOSCOPY;  Service: Gastroenterology;;  injection of epinephrine into gastric polyp   SUBMUCOSAL LIFTING INJECTION  01/16/2020   Procedure: SUBMUCOSAL LIFTING INJECTION;  Surgeon: Lemar Lofty., MD;  Location: [facility] ENDOSCOPY;  Service: Gastroenterology;;   SUBMUCOSAL LIFTING INJECTION  03/05/2023   Procedure: SUBMUCOSAL LIFTING INJECTION;  Surgeon: Lemar Lofty., MD;  Location: [facility] ENDOSCOPY;  Service: Gastroenterology;;   WISDOM TOOTH EXTRACTION      Allergies  Allergen Reactions   Black Pepper [Piper] Other (See Comments)    bloating   Ciprofloxacin Other (See Comments)    Bleeding-blood in stool    Nsaids     History of bleeding    Outpatient Encounter Medications as of 08/31/2023  Medication Sig   amLODipine (NORVASC) 2.5 MG tablet Take 1 tablet (2.5 mg total) by mouth daily. (Patient taking differently: Take 2.5  mg by mouth every evening.)   b complex vitamins tablet Take 1 tablet by mouth daily.   bimatoprost (LUMIGAN) 0.01 % SOLN Place 1 drop into both eyes at bedtime.   cetirizine (ZYRTEC) 10 MG tablet Take 10 mg by mouth daily as needed for allergies.    Cholecalciferol (VITAMIN D) 2000 UNITS CAPS Take 1 capsule (2,000 Units total) by mouth daily.   fluticasone (FLONASE) 50 MCG/ACT nasal spray Place 2 sprays into both nostrils daily as needed for allergies or rhinitis.   folic acid (FOLVITE) 800 MCG tablet Take 800 mcg by mouth daily.    Glucosamine-Chondroit-Vit C-Mn (GLUCOSAMINE CHONDR 1500 COMPLX PO) Take 2 tablets by mouth daily.    omeprazole (PRILOSEC) 40 MG capsule Take 1 capsule (40 mg total) by mouth 2 (two) times daily before a meal.   rosuvastatin (CRESTOR) 5 MG tablet TAKE ONE TABLET BY MOUTH DAILY   timolol (TIMOPTIC) 0.5 % ophthalmic solution Place 1 drop into both eyes in the morning.   vitamin B-12 (CYANOCOBALAMIN) 1000 MCG tablet Take 1,000 mcg by mouth daily.   No facility-administered encounter medications on file as of 08/31/2023.    Review of Systems:  Review of Systems  Constitutional:  Negative for activity change, appetite change, chills, diaphoresis, fatigue, fever  and unexpected weight change.  Respiratory:  Negative for cough, shortness of breath, wheezing and stridor.   Cardiovascular:  Negative for chest pain, palpitations and leg swelling.  Gastrointestinal:  Negative for abdominal distention, abdominal pain, constipation and diarrhea.  Genitourinary:  Negative for difficulty urinating and dysuria.  Musculoskeletal:  Negative for arthralgias, back pain, gait problem, joint swelling and myalgias.  Neurological:  Negative for dizziness, seizures, syncope, facial asymmetry, speech difficulty, weakness and headaches.  Hematological:  Negative for adenopathy. Does not bruise/bleed easily.  Psychiatric/Behavioral:  Negative for agitation, behavioral problems and  confusion.     Health Maintenance  Topic Date Due   COVID-19 Vaccine (9 - 2024-25 season) 09/01/2023   Medicare Annual Wellness (AWV)  07/19/2024   DTaP/Tdap/Td (3 - Td or Tdap) 11/02/2030   Pneumonia Vaccine 56+ Years old  Completed   INFLUENZA VACCINE  Completed   Zoster Vaccines- Shingrix  Completed   HPV VACCINES  Aged Out   Colonoscopy  Discontinued    Physical Exam: Vitals:   08/31/23 1306 08/31/23 1330  BP: (!) 160/84 (!) 156/80  Pulse: 79   Resp: 17   Temp: 97.8 F (36.6 C)   TempSrc: Temporal   SpO2: 95%   Weight: 172 lb (78 kg)   Height: 5' 5.5" (1.664 m)     Body mass index is 28.19 kg/m. Physical Exam Constitutional:      General: He is not in acute distress.    Appearance: He is not diaphoretic.  HENT:     Head: Normocephalic and atraumatic.     Right Ear: Tympanic membrane normal.     Left Ear: Tympanic membrane normal.     Nose: Nose normal.     Mouth/Throat:     Mouth: Mucous membranes are moist.     Pharynx: Oropharynx is clear.  Eyes:     Conjunctiva/sclera: Conjunctivae normal.     Pupils: Pupils are equal, round, and reactive to light.  Neck:     Thyroid: No thyromegaly.     Vascular: No JVD.     Trachea: No tracheal deviation.  Cardiovascular:     Rate and Rhythm: Normal rate and regular rhythm.     Heart sounds: No murmur heard. Pulmonary:     Effort: Pulmonary effort is normal. No respiratory distress.     Breath sounds: Normal breath sounds. No wheezing.  Abdominal:     General: Bowel sounds are normal. There is no distension.     Palpations: Abdomen is soft.     Tenderness: There is no abdominal tenderness.  Musculoskeletal:     Cervical back: No rigidity or tenderness.     Right lower leg: No edema.     Left lower leg: No edema.  Lymphadenopathy:     Cervical: No cervical adenopathy.  Skin:    General: Skin is warm and dry.  Neurological:     Mental Status: He is alert and oriented to person, place, and time.     Cranial  Nerves: No cranial nerve deficit.  Psychiatric:        Mood and Affect: Mood normal.     Labs reviewed: Basic Metabolic Panel: Recent Labs    09/18/22 0000 02/17/23 0000 08/18/23 0000  NA 140 139 141  K 3.9 4.2 4.0  CL 99 99 102  CO2 31* 25* 26*  BUN 13 12 14   CREATININE 1.0 1.1 1.1  CALCIUM 9.1 9.0 9.2  TSH  --   --  2.72   Liver  Function Tests: Recent Labs    09/18/22 0000 11/18/22 0000 08/18/23 0000  AST 73* 27 26  ALT 114* 22 20  ALKPHOS 124 105 89  ALBUMIN 3.8 3.9 3.9   No results for input(s): "LIPASE", "AMYLASE" in the last 8760 hours. No results for input(s): "AMMONIA" in the last 8760 hours. CBC: Recent Labs    09/18/22 0000 08/18/23 0000  WBC 10.2 7.1  HGB 13.8 14.9  HCT 40* 43  PLT 278 250   Lipid Panel: Recent Labs    09/18/22 0000 08/18/23 0000  CHOL 159 153  HDL 57 46  LDLCALC 82 70  TRIG 101 187*   Lab Results  Component Value Date   HGBA1C 6.1 02/17/2023    Procedures since last visit: No results found.  Assessment/Plan   1. Prediabetes Lab Results  Component Value Date   HGBA1C 6.1 02/17/2023   Will check A1C at next apt  2. Essential hypertension Slightly above goal Please have the clinic nurse recheck your blood pressure and let us know if it is >/= 150/90  3. Mixed hyperlipidemia Lab Results  Component Value Date   CHOL 153 08/18/2023   HDL 46 08/18/2023   LDLCALC 70 08/18/2023   TRIG 187 (A) 08/18/2023   Continue crestor   4. Barrett's esophagus with low grade dysplasia On PPI Followed by GI Recommend calling them to confirm when f/u endoscopy should be scheduled (note in epic says 3 months?)   5. Mild stage glaucoma Followed by ophthalmology    Labs/tests ordered:  * No order type specified *  Next appt:  6 months with Dr Chales Abrahams   Total time :  time greater than 50% of total time spent doing pt counseling and coordination of care

## 2023-09-03 ENCOUNTER — Encounter: Payer: Self-pay | Admitting: Adult Health

## 2023-09-14 ENCOUNTER — Encounter: Payer: Self-pay | Admitting: Adult Health

## 2023-09-14 ENCOUNTER — Non-Acute Institutional Stay: Payer: Medicare Other | Admitting: Adult Health

## 2023-09-14 VITALS — BP 148/84 | HR 77 | Temp 98.2°F | Resp 17 | Ht 65.5 in | Wt 168.4 lb

## 2023-09-14 DIAGNOSIS — J209 Acute bronchitis, unspecified: Secondary | ICD-10-CM | POA: Diagnosis not present

## 2023-09-14 MED ORDER — PREDNISONE 20 MG PO TABS
20.0000 mg | ORAL_TABLET | Freq: Every day | ORAL | 0 refills | Status: DC
Start: 2023-09-14 — End: 2023-11-23

## 2023-09-14 MED ORDER — ALBUTEROL SULFATE HFA 108 (90 BASE) MCG/ACT IN AERS
2.0000 | INHALATION_SPRAY | Freq: Four times a day (QID) | RESPIRATORY_TRACT | 0 refills | Status: DC | PRN
Start: 2023-09-14 — End: 2023-11-23

## 2023-09-14 NOTE — Progress Notes (Signed)
[facility]   POS: clinic  Provider:  Peggye Ley, ANP [facility] [phone]    Goals of Care:     09/14/2023    2:51 PM  Advanced Directives  Does Patient Have a Medical Advance Directive? Yes  Type of Estate agent of [place];Living will  Does patient want to make changes to medical advance directive? No - Patient declined  Copy of Healthcare Power of Attorney in Chart? Yes - validated most recent copy scanned in chart (See row information)     Chief Complaint  Patient presents with   Acute Visit    Patient is being seen for acute visit for a cough, cold and wheezing . Patient states he has been coughing up green phlegm and taking sudafed     HPI: Patient is a 82 y.o. male seen today for an acute visit for a cough, cold, and wheezing.   Symptoms present for 6 days of cough and cold. Wheezing 2 days ago.  No chest pain. No shortness of breath Decreased appetite.  Has some green sputum.  No diarrhea or nausea Negative for flu and covid Was at a family gathering with sick contacts.  No hx of asthma He had a bronchitis in April took albuterol and prednisone  No smoking   Past Medical History:  Diagnosis Date   Allergy    seasonal   Arthritis    knees   Barrett's esophagus    Cataract    BILATERAL REMOVED   Gastric polyp    GERD (gastroesophageal reflux disease)    Glaucoma    Hemorrhoids    Hyperlipidemia    Hypertension    Pneumonia    as teenager   RBBB    RBBB    Vasovagal syncope    last episode was 2015-16; none since   Wears glasses     Past Surgical History:  Procedure Laterality Date   BIOPSY  01/16/2020   Procedure: BIOPSY;  Surgeon: Lemar Lofty., MD;  Location: [facility] ENDOSCOPY;  Service: Gastroenterology;;   CARDIAC CATHETERIZATION  1993   CATARACT EXTRACTION W/ INTRAOCULAR LENS  IMPLANT, BILATERAL  2021   COLONOSCOPY  2002; 2007   ENDOSCOPIC MUCOSAL RESECTION N/A 01/16/2020   Procedure:  ENDOSCOPIC MUCOSAL RESECTION;  Surgeon: Lemar Lofty., MD;  Location: [facility] ENDOSCOPY;  Service: Gastroenterology;  Laterality: N/A;   ENDOSCOPIC MUCOSAL RESECTION N/A 02/03/2022   Procedure: ENDOSCOPIC MUCOSAL RESECTION;  Surgeon: Meridee Score Netty Starring., MD;  Location: [facility] ENDOSCOPY;  Service: Gastroenterology;  Laterality: N/A;   ENDOSCOPIC MUCOSAL RESECTION  03/05/2023   Procedure: ENDOSCOPIC MUCOSAL RESECTION;  Surgeon: Meridee Score Netty Starring., MD;  Location: [facility] ENDOSCOPY;  Service: Gastroenterology;;   ESOPHAGOGASTRODUODENOSCOPY (EGD) WITH PROPOFOL N/A 01/16/2020   Procedure: ESOPHAGOGASTRODUODENOSCOPY (EGD) WITH PROPOFOL;  Surgeon: Lemar Lofty., MD;  Location: [facility] ENDOSCOPY;  Service: Gastroenterology;  Laterality: N/A;   ESOPHAGOGASTRODUODENOSCOPY (EGD) WITH PROPOFOL N/A 02/03/2022   Procedure: ESOPHAGOGASTRODUODENOSCOPY (EGD) WITH PROPOFOL;  Surgeon: Meridee Score Netty Starring., MD;  Location: [facility] ENDOSCOPY;  Service: Gastroenterology;  Laterality: N/A;   ESOPHAGOGASTRODUODENOSCOPY (EGD) WITH PROPOFOL N/A 03/05/2023   Procedure: ESOPHAGOGASTRODUODENOSCOPY (EGD) WITH PROPOFOL;  Surgeon: Meridee Score Netty Starring., MD;  Location: [facility] ENDOSCOPY;  Service: Gastroenterology;  Laterality: N/A;   GI RADIOFREQUENCY ABLATION  02/03/2022   Procedure: GI RADIOFREQUENCY ABLATION;  Surgeon: Meridee Score Netty Starring., MD;  Location: [facility] ENDOSCOPY;  Service: Gastroenterology;;   HEMOSTASIS CLIP PLACEMENT  01/16/2020   Procedure: HEMOSTASIS CLIP PLACEMENT;  Surgeon: Lemar Lofty., MD;  Location: [facility] ENDOSCOPY;  Service: Gastroenterology;;   HEMOSTASIS CLIP PLACEMENT  02/03/2022   Procedure: HEMOSTASIS CLIP PLACEMENT;  Surgeon: Lemar Lofty., MD;  Location: [facility] ENDOSCOPY;  Service: Gastroenterology;;   HEMOSTASIS CLIP PLACEMENT  03/05/2023   Procedure: HEMOSTASIS CLIP PLACEMENT;  Surgeon: Lemar Lofty., MD;  Location: [facility] ENDOSCOPY;  Service: Gastroenterology;;   HEMOSTASIS CONTROL   01/16/2020   Procedure: HEMOSTASIS CONTROL;  Surgeon: Lemar Lofty., MD;  Location: [facility] ENDOSCOPY;  Service: Gastroenterology;;   POLYPECTOMY     POLYPECTOMY  02/03/2022   Procedure: POLYPECTOMY;  Surgeon: Lemar Lofty., MD;  Location: [facility] ENDOSCOPY;  Service: Gastroenterology;;   RADIOFREQUENCY ABLATION  03/05/2023   Procedure: RADIO FREQUENCY ABLATION;  Surgeon: Lemar Lofty., MD;  Location: [facility] ENDOSCOPY;  Service: Gastroenterology;;   Theresia Majors  02/03/2022   Procedure: Theresia Majors;  Surgeon: Mansouraty, Netty Starring., MD;  Location: [facility] ENDOSCOPY;  Service: Gastroenterology;;  injection of epinephrine into gastric polyp   SUBMUCOSAL LIFTING INJECTION  01/16/2020   Procedure: SUBMUCOSAL LIFTING INJECTION;  Surgeon: Lemar Lofty., MD;  Location: [facility] ENDOSCOPY;  Service: Gastroenterology;;   SUBMUCOSAL LIFTING INJECTION  03/05/2023   Procedure: SUBMUCOSAL LIFTING INJECTION;  Surgeon: Lemar Lofty., MD;  Location: [facility] ENDOSCOPY;  Service: Gastroenterology;;   WISDOM TOOTH EXTRACTION      Allergies  Allergen Reactions   Black Pepper [Piper] Other (See Comments)    bloating   Ciprofloxacin Other (See Comments)    Bleeding-blood in stool    Nsaids     History of bleeding    Outpatient Encounter Medications as of 09/14/2023  Medication Sig   amLODipine (NORVASC) 2.5 MG tablet Take 1 tablet (2.5 mg total) by mouth daily. (Patient taking differently: Take 2.5 mg by mouth every evening.)   b complex vitamins tablet Take 1 tablet by mouth daily.   bimatoprost (LUMIGAN) 0.01 % SOLN Place 1 drop into both eyes at bedtime.   cetirizine (ZYRTEC) 10 MG tablet Take 10 mg by mouth daily as needed for allergies.    Cholecalciferol (VITAMIN D) 2000 UNITS CAPS Take 1 capsule (2,000 Units total) by mouth daily.   fluticasone (FLONASE) 50 MCG/ACT nasal spray Place 2 sprays into both nostrils daily as needed for allergies or rhinitis.   folic acid (FOLVITE) 800  MCG tablet Take 800 mcg by mouth daily.    Glucosamine-Chondroit-Vit C-Mn (GLUCOSAMINE CHONDR 1500 COMPLX PO) Take 2 tablets by mouth daily.    omeprazole (PRILOSEC) 40 MG capsule Take 1 capsule (40 mg total) by mouth 2 (two) times daily before a meal.   rosuvastatin (CRESTOR) 5 MG tablet TAKE ONE TABLET BY MOUTH DAILY   timolol (TIMOPTIC) 0.5 % ophthalmic solution Place 1 drop into both eyes in the morning.   vitamin B-12 (CYANOCOBALAMIN) 1000 MCG tablet Take 1,000 mcg by mouth daily.   No facility-administered encounter medications on file as of 09/14/2023.    Review of Systems:  Review of Systems  Constitutional:  Positive for appetite change. Negative for activity change, chills, diaphoresis, fatigue, fever and unexpected weight change.  HENT:  Positive for congestion and rhinorrhea. Negative for dental problem, drooling, ear discharge, ear pain, sore throat and trouble swallowing.   Respiratory:  Positive for cough and wheezing. Negative for shortness of breath and stridor.   Cardiovascular:  Negative for chest pain, palpitations and leg swelling.  Gastrointestinal:  Negative for abdominal distention, abdominal pain, constipation and diarrhea.  Genitourinary:  Negative for difficulty urinating and dysuria.  Musculoskeletal:  Negative for  arthralgias, back pain, gait problem, joint swelling and myalgias.  Neurological:  Negative for dizziness, seizures, syncope, facial asymmetry, speech difficulty, weakness and headaches.  Hematological:  Negative for adenopathy. Does not bruise/bleed easily.  Psychiatric/Behavioral:  Negative for agitation, behavioral problems and confusion.     Health Maintenance  Topic Date Due   COVID-19 Vaccine (9 - 2024-25 season) 09/01/2023   Medicare Annual Wellness (AWV)  07/19/2024   DTaP/Tdap/Td (3 - Td or Tdap) 11/02/2030   Pneumonia Vaccine 76+ Years old  Completed   INFLUENZA VACCINE  Completed   Zoster Vaccines- Shingrix  Completed   HPV VACCINES   Aged Out   Colonoscopy  Discontinued    Physical Exam: Vitals:   09/14/23 1507  BP: (!) 148/84  Pulse: 77  Resp: 17  Temp: 98.2 F (36.8 C)  TempSrc: Temporal  SpO2: 98%  Weight: 168 lb 6.4 oz (76.4 kg)  Height: 5' 5.5" (1.664 m)   Body mass index is 27.6 kg/m. Physical Exam Vitals and nursing note reviewed.  Constitutional:      General: He is not in acute distress.    Appearance: He is not diaphoretic.  HENT:     Head: Normocephalic and atraumatic.     Right Ear: Tympanic membrane normal.     Left Ear: Tympanic membrane normal.     Nose: Congestion present.     Mouth/Throat:     Mouth: Mucous membranes are moist.     Pharynx: Oropharynx is clear. No oropharyngeal exudate or posterior oropharyngeal erythema.  Eyes:     Conjunctiva/sclera: Conjunctivae normal.     Pupils: Pupils are equal, round, and reactive to light.     Comments: Erythema to sclera  Neck:     Thyroid: No thyromegaly.     Vascular: No JVD.     Trachea: No tracheal deviation.  Cardiovascular:     Rate and Rhythm: Normal rate and regular rhythm.     Heart sounds: No murmur heard. Pulmonary:     Effort: Pulmonary effort is normal. No respiratory distress.     Breath sounds: Wheezing (RUL cleared with cough) present.  Abdominal:     General: Bowel sounds are normal. There is no distension.     Palpations: Abdomen is soft.     Tenderness: There is no abdominal tenderness.  Lymphadenopathy:     Cervical: Cervical adenopathy present.  Skin:    General: Skin is warm and dry.  Neurological:     Mental Status: He is alert and oriented to person, place, and time.     Cranial Nerves: No cranial nerve deficit.     Labs reviewed: Basic Metabolic Panel: Recent Labs    09/18/22 0000 02/17/23 0000 08/18/23 0000  NA 140 139 141  K 3.9 4.2 4.0  CL 99 99 102  CO2 31* 25* 26*  BUN 13 12 14   CREATININE 1.0 1.1 1.1  CALCIUM 9.1 9.0 9.2  TSH  --   --  2.72   Liver Function Tests: Recent Labs     09/18/22 0000 11/18/22 0000 08/18/23 0000  AST 73* 27 26  ALT 114* 22 20  ALKPHOS 124 105 89  ALBUMIN 3.8 3.9 3.9   No results for input(s): "LIPASE", "AMYLASE" in the last 8760 hours. No results for input(s): "AMMONIA" in the last 8760 hours. CBC: Recent Labs    09/18/22 0000 08/18/23 0000  WBC 10.2 7.1  HGB 13.8 14.9  HCT 40* 43  PLT 278 250   Lipid Panel:  Recent Labs    09/18/22 0000 08/18/23 0000  CHOL 159 153  HDL 57 46  LDLCALC 82 70  TRIG 101 187*   Lab Results  Component Value Date   HGBA1C 6.2 08/18/2023    Procedures since last visit: No results found.  Assessment/Plan  1. Acute bronchitis, unspecified organism (Primary)  - predniSONE (DELTASONE) 20 MG tablet; Take 1 tablet (20 mg total) by mouth daily with breakfast.  Dispense: 5 tablet; Refill: 0  - albuterol (VENTOLIN HFA) 108 (90 Base) MCG/ACT inhaler; Inhale 2 puffs into the lungs every 6 (six) hours as needed for wheezing or shortness of breath.  Dispense: 8.5 g; Refill: 0  If not improving in 48 hrs please contact me for possible antibiotic  Try guaifenesin (brand name Mucinex or Robitussin)  Avoid drugs with DM or D at the end that can raise blood pressure.   Labs/tests ordered:  * No order type specified * Next appt:  03/01/2024   Total time   time greater than 50% of total time spent doing pt counseling and coordination of care

## 2023-09-14 NOTE — Patient Instructions (Addendum)
If not improving in 48 hrs please contact me  Try guaifenesin (brand name Mucinex or Robitussin)  Avoid drugs with DM or D at the end that can raise blood pressure.

## 2023-09-17 MED ORDER — AMOXICILLIN-POT CLAVULANATE 875-125 MG PO TABS: 1.0000 | ORAL_TABLET | Freq: Two times a day (BID) | ORAL | 0 refills | Status: DC

## 2023-10-26 ENCOUNTER — Other Ambulatory Visit: Payer: Self-pay | Admitting: Internal Medicine

## 2023-10-26 DIAGNOSIS — E782 Mixed hyperlipidemia: Secondary | ICD-10-CM

## 2023-11-23 ENCOUNTER — Ambulatory Visit: Admitting: Adult Health

## 2023-11-23 ENCOUNTER — Ambulatory Visit: Payer: Self-pay | Admitting: Internal Medicine

## 2023-11-23 VITALS — BP 110/80 | HR 70 | Temp 97.9°F | Resp 20 | Ht 71.0 in | Wt 166.8 lb

## 2023-11-23 DIAGNOSIS — M25561 Pain in right knee: Secondary | ICD-10-CM

## 2023-11-23 MED ORDER — PREDNISONE 20 MG PO TABS
20.0000 mg | ORAL_TABLET | Freq: Every day | ORAL | 0 refills | Status: AC
Start: 2023-11-23 — End: ?

## 2023-11-23 NOTE — Telephone Encounter (Signed)
    Chief Complaint: appointment change- requesting original appointment offered Symptoms: knee pain- patient has change- small blister area, pain  Disposition: [] ED /[] Urgent Care (no appt availability in office) / [x] Appointment(In office/virtual)/ []  [facility]/ [] Home Care/ [] Refused Recommended Disposition /[] [facility]/ []  Follow-up with PCP Additional Notes: Patient has been scheduled as he requested.    Copied from CRM [id number]. Topic: Clinical - Red Word Triage >> Nov 23, 2023 12:11 PM Brittney F wrote: Red Word that prompted transfer to Nurse Triage: Knee pain( right knee) worsening since visit  Pain has moved from the bottom of your knee to the top and is occupied by a fluid filled mass. The pain is only present when the patient bends his knee.  Patient reports changes in knee- has blister and increased pain- would like to be seen at [facility] today instead of office visit tomorrow.  Reason for Disposition . Caller has already spoken with another triager and has no further questions.  Answer Assessment - Initial Assessment Questions 1. REASON FOR CALL or QUESTION: "What is your reason for calling today?" or "How can I best help you?" or "What question do you have that I can help answer?"     Patient is requesting changes to his appointment- would like to be seen at [facility] today. Patient appointment changes per request.  Answer Assessment - Initial Assessment Questions Patient is calling to request cahnge in appointment - would like to schedule original appointment offered today at [facility].  Protocols used: Information Only Call - No Triage-A-AH, No Contact or Duplicate Contact Call-A-AH

## 2023-11-23 NOTE — Patient Instructions (Signed)
 Ice the knee 3-4 times a day for 15 min  Try prednisone and if not improving let me know.

## 2023-11-23 NOTE — Progress Notes (Signed)
 Location: [facility]  POS: clinic  Provider:  Peggye Ley, ANP [facility] [phone]   Goals of Care:     11/23/2023    3:43 PM  Advanced Directives  Does Patient Have a Medical Advance Directive? Yes  Type of Estate agent of [place];Living will  Does patient want to make changes to medical advance directive? No - Patient declined  Copy of Healthcare Power of Attorney in Chart? Yes - validated most recent copy scanned in chart (See row information)     Chief Complaint  Patient presents with   Knee Pain    Right leg knee pain.    HPI: Patient is a 83 y.o. male seen today for right knee pain   Knee pain and swelling began a week ago, initially located in the anterior portion below the patella, now moved upwards. He did hit his knee on the coffee table. But did not have a fall or significant bruising afterwards. The pain has worsened over time, and the area now feels warmer.  No recent trauma or bruising to the area. Pain intensifies with certain movements. Ice and heat have not provided significant relief. Tylenol taken approximately four to five hours ago offered some relief. Unable to take ibuprofen but has previously tolerated prednisone well.  History of Achilles issues last year, for which he visited an orthopedic clinic near [place]. Does not have a current orthopedist and does not require a referral for insurance purposes.  Past Medical History:  Diagnosis Date   Allergy    seasonal   Arthritis    knees   Barrett's esophagus    Cataract    BILATERAL REMOVED   Gastric polyp    GERD (gastroesophageal reflux disease)    Glaucoma    Hemorrhoids    Hyperlipidemia    Hypertension    Pneumonia    as teenager   RBBB    RBBB    Vasovagal syncope    last episode was 2015-16; none since   Wears glasses     Past Surgical History:  Procedure Laterality Date   BIOPSY  01/16/2020   Procedure: BIOPSY;  Surgeon:  Lemar Lofty., MD;  [facility] ENDOSCOPY;  Service: Gastroenterology;;   CARDIAC CATHETERIZATION  1993   CATARACT EXTRACTION W/ INTRAOCULAR LENS  IMPLANT, BILATERAL  2021   COLONOSCOPY  2002; 2007   ENDOSCOPIC MUCOSAL RESECTION N/A 01/16/2020   Procedure: ENDOSCOPIC MUCOSAL RESECTION;  Surgeon: Lemar Lofty., MD;  Location: [facility] ENDOSCOPY;  Service: Gastroenterology;  Laterality: N/A;   ENDOSCOPIC MUCOSAL RESECTION N/A 02/03/2022   Procedure: ENDOSCOPIC MUCOSAL RESECTION;  Surgeon: Meridee Score Netty Starring., MD;  Location: [facility] ENDOSCOPY;  Service: Gastroenterology;  Laterality: N/A;   ENDOSCOPIC MUCOSAL RESECTION  03/05/2023   Procedure: ENDOSCOPIC MUCOSAL RESECTION;  Surgeon: Meridee Score Netty Starring., MD;  Location: [facility] ENDOSCOPY;  Service: Gastroenterology;;   ESOPHAGOGASTRODUODENOSCOPY (EGD) WITH PROPOFOL N/A 01/16/2020   Procedure: ESOPHAGOGASTRODUODENOSCOPY (EGD) WITH PROPOFOL;  Surgeon: Lemar Lofty., MD;  Location: [facility] ENDOSCOPY;  Service: Gastroenterology;  Laterality: N/A;   ESOPHAGOGASTRODUODENOSCOPY (EGD) WITH PROPOFOL N/A 02/03/2022   Procedure: ESOPHAGOGASTRODUODENOSCOPY (EGD) WITH PROPOFOL;  Surgeon: Meridee Score Netty Starring., MD;  Location: [facility] ENDOSCOPY;  Service: Gastroenterology;  Laterality: N/A;   ESOPHAGOGASTRODUODENOSCOPY (EGD) WITH PROPOFOL N/A 03/05/2023   Procedure: ESOPHAGOGASTRODUODENOSCOPY (EGD) WITH PROPOFOL;  Surgeon: Meridee Score Netty Starring., MD;  Location: [facility] ENDOSCOPY;  Service: Gastroenterology;  Laterality: N/A;   GI RADIOFREQUENCY ABLATION  02/03/2022   Procedure: GI RADIOFREQUENCY ABLATION;  Surgeon: Meridee Score,  Netty Starring., MD;  Location: [facility] ENDOSCOPY;  Service: Gastroenterology;;   HEMOSTASIS CLIP PLACEMENT  01/16/2020   Procedure: HEMOSTASIS CLIP PLACEMENT;  Surgeon: Lemar Lofty., MD;  Location: [facility] ENDOSCOPY;  Service: Gastroenterology;;   HEMOSTASIS CLIP PLACEMENT  02/03/2022   Procedure: HEMOSTASIS CLIP PLACEMENT;  Surgeon: Lemar Lofty., MD;  Location: [facility] ENDOSCOPY;  Service: Gastroenterology;;   HEMOSTASIS CLIP PLACEMENT  03/05/2023   Procedure: HEMOSTASIS CLIP PLACEMENT;  Surgeon: Lemar Lofty., MD;  Location: [facility] ENDOSCOPY;  Service: Gastroenterology;;   HEMOSTASIS CONTROL  01/16/2020   Procedure: HEMOSTASIS CONTROL;  Surgeon: Lemar Lofty., MD;  Location: [facility] ENDOSCOPY;  Service: Gastroenterology;;   POLYPECTOMY     POLYPECTOMY  02/03/2022   Procedure: POLYPECTOMY;  Surgeon: Lemar Lofty., MD;  Location: [facility] ENDOSCOPY;  Service: Gastroenterology;;   RADIOFREQUENCY ABLATION  03/05/2023   Procedure: RADIO FREQUENCY ABLATION;  Surgeon: Lemar Lofty., MD;  Location: [facility] ENDOSCOPY;  Service: Gastroenterology;;   Theresia Majors  02/03/2022   Procedure: Theresia Majors;  Surgeon: Lemar Lofty., MD;  Location: [facility] ENDOSCOPY;  Service: Gastroenterology;;  injection of epinephrine into gastric polyp   SUBMUCOSAL LIFTING INJECTION  01/16/2020   Procedure: SUBMUCOSAL LIFTING INJECTION;  Surgeon: Lemar Lofty., MD;  Location: [facility] ENDOSCOPY;  Service: Gastroenterology;;   SUBMUCOSAL LIFTING INJECTION  03/05/2023   Procedure: SUBMUCOSAL LIFTING INJECTION;  Surgeon: Lemar Lofty., MD;  Location: [facility] ENDOSCOPY;  Service: Gastroenterology;;   WISDOM TOOTH EXTRACTION      Allergies  Allergen Reactions   Black Pepper [Piper] Other (See Comments)    bloating   Ciprofloxacin Other (See Comments)    Bleeding-blood in stool    Nsaids     History of bleeding    Outpatient Encounter Medications as of 11/23/2023  Medication Sig   amLODipine (NORVASC) 2.5 MG tablet Take 1 tablet (2.5 mg total) by mouth daily. (Patient taking differently: Take 2.5 mg by mouth every evening.)   b complex vitamins tablet Take 1 tablet by mouth daily.   cetirizine (ZYRTEC) 10 MG tablet Take 10 mg by mouth daily as needed for allergies.    Cholecalciferol (VITAMIN D) 2000 UNITS CAPS Take 1  capsule (2,000 Units total) by mouth daily.   fluticasone (FLONASE) 50 MCG/ACT nasal spray Place 2 sprays into both nostrils daily as needed for allergies or rhinitis.   folic acid (FOLVITE) 800 MCG tablet Take 800 mcg by mouth daily.    Glucosamine-Chondroit-Vit C-Mn (GLUCOSAMINE CHONDR 1500 COMPLX PO) Take 2 tablets by mouth daily.    omeprazole (PRILOSEC) 40 MG capsule Take 1 capsule (40 mg total) by mouth 2 (two) times daily before a meal.   rosuvastatin (CRESTOR) 5 MG tablet TAKE 1 TABLET BY MOUTH DAILY   timolol (TIMOPTIC) 0.5 % ophthalmic solution Place 1 drop into both eyes in the morning.   vitamin B-12 (CYANOCOBALAMIN) 1000 MCG tablet Take 1,000 mcg by mouth daily.   albuterol (VENTOLIN HFA) 108 (90 Base) MCG/ACT inhaler Inhale 2 puffs into the lungs every 6 (six) hours as needed for wheezing or shortness of breath.   amoxicillin-clavulanate (AUGMENTIN) 875-125 MG tablet Take 1 tablet by mouth 2 (two) times daily.   bimatoprost (LUMIGAN) 0.01 % SOLN Place 1 drop into both eyes at bedtime.   predniSONE (DELTASONE) 20 MG tablet Take 1 tablet (20 mg total) by mouth daily with breakfast.   No facility-administered encounter medications on file as of 11/23/2023.    Review of Systems:  Review of Systems  Constitutional:  Positive for activity change. Negative for appetite change, chills, diaphoresis, fatigue, fever and unexpected weight change.  Musculoskeletal:  Positive for gait problem and joint swelling.       Right knee pain    Health Maintenance  Topic Date Due   COVID-19 Vaccine (9 - 2024-25 season) 12/09/2023 (Originally 09/01/2023)   Medicare Annual Wellness (AWV)  07/19/2024   DTaP/Tdap/Td (3 - Td or Tdap) 11/02/2030   Pneumonia Vaccine 20+ Years old  Completed   INFLUENZA VACCINE  Completed   Zoster Vaccines- Shingrix  Completed   HPV VACCINES  Aged Out   Colonoscopy  Discontinued    Physical Exam: Vitals:   11/23/23 1538  BP: 110/80  Pulse: 70  Resp: 20  Temp:  97.9 F (36.6 C)  SpO2: 99%  Weight: 166 lb 12.8 oz (75.7 kg)  Height: 5\' 11"  (1.803 m)   Body mass index is 23.26 kg/m. Physical Exam Vitals reviewed.  Constitutional:      Appearance: Normal appearance.  Musculoskeletal:        General: Swelling and tenderness present. No deformity or signs of injury.     Right knee: Swelling and effusion present. No deformity, erythema, ecchymosis, lacerations, bony tenderness or crepitus. Normal range of motion. Tenderness present. No LCL laxity, MCL laxity or ACL laxity. Normal alignment. Normal pulse.     Instability Tests: Anterior drawer test negative.     Left knee: Normal.     Right lower leg: No edema.     Left lower leg: No edema.  Neurological:     Mental Status: He is alert and oriented to person, place, and time.     Labs reviewed: Basic Metabolic Panel: Recent Labs    02/17/23 0000 08/18/23 0000  NA 139 141  K 4.2 4.0  CL 99 102  CO2 25* 26*  BUN 12 14  CREATININE 1.1 1.1  CALCIUM 9.0 9.2  TSH  --  2.72   Liver Function Tests: Recent Labs    08/18/23 0000  AST 26  ALT 20  ALKPHOS 89  ALBUMIN 3.9   No results for input(s): "LIPASE", "AMYLASE" in the last 8760 hours. No results for input(s): "AMMONIA" in the last 8760 hours. CBC: Recent Labs    08/18/23 0000  WBC 7.1  HGB 14.9  HCT 43  PLT 250   Lipid Panel: Recent Labs    08/18/23 0000  CHOL 153  HDL 46  LDLCALC 70  TRIG 187*   Lab Results  Component Value Date   HGBA1C 6.2 08/18/2023    Procedures since last visit: No results found.  Assessment/Plan  Right Knee Pain Pain to the anterior aspect of the knee with swelling  Warm to touch. Possible fluid collection secondary to underlying arthritis or injury. -Start Prednisone 20mg  daily for 5 days. -Ice the knee 3-4 times a day for 15 minutes. -If no improvement by 11/26/2023, patient to contact via MyChart for possible referral to orthopedics for further evaluation and possible  drainage.   Labs/tests ordered:  * No order type specified * Next appt:  03/01/2024  Total time  time greater than 50% of total time spent doing pt counseling and coordination of care

## 2023-11-23 NOTE — Telephone Encounter (Signed)
 Appointment scheduled in office with patient for 11/24/2023

## 2023-11-23 NOTE — Telephone Encounter (Signed)
   Chief Complaint: R knee pain- worse with movement- requesting Ortho referral Symptoms: Patient states he has had pain with movement of R knee for 1 week. Patient states no swelling/discoloration. Patient lives at [facility] and had nurse at facility check it and could not find any abnormality . Patient feels he may need to see orthopedic and wonders if he can get referral. Patient states hurts worse if he moves quickly- otherwise pain level is tolerable.  Frequency: started 1 week ago Pertinent Negatives: Patient denies no swelling Disposition: [] ED /[] Urgent Care (no appt availability in office) / [] Appointment(In office/virtual)/ []  [facility]/ [] Home Care/ [x] Refused Recommended Disposition /[] [facility]/ []  Follow-up with PCP Additional Notes: Patient states he will come for appointment if needed- but he thinks he needs ortho assistance for this. Please let him know- patient request call back.    Copied from CRM [id number]. Topic: Clinical - Red Word Triage >> Nov 23, 2023  8:49 AM Maxwell Marion wrote: Red Word that prompted transfer to Nurse Triage: Patient is experiencing pain in his right knee for a couple of days. The pain starts when he bends his knee and can get up to a 9 on 10 on pain scale Reason for Disposition  [1] MODERATE pain (e.g., interferes with normal activities, limping) AND [2] present > 3 days  Answer Assessment - Initial Assessment Questions 1. LOCATION and RADIATION: "Where is the pain located?"      Right knee 2. QUALITY: "What does the pain feel like?"  (e.g., sharp, dull, aching, burning)     Bending has pain, stabbing pain- front of knee 3. SEVERITY: "How bad is the pain?" "What does it keep you from doing?"   (Scale 1-10; or mild, moderate, severe)   -  MILD (1-3): doesn't interfere with normal activities    -  MODERATE (4-7): interferes with normal activities (e.g., work or school) or awakens from sleep, limping    -  SEVERE (8-10):  excruciating pain, unable to do any normal activities, unable to walk     9/10- usually if moves quickly, usually 3-4/10 4. ONSET: "When did the pain start?" "Does it come and go, or is it there all the time?"     Started 1 week ago 5. RECURRENT: "Have you had this pain before?" If Yes, ask: "When, and what happened then?"     New onset  8. ASSOCIATED SYMPTOMS: "Is there any swelling or redness of the knee?"     No swelling  Protocols used: Knee Pain-A-AH

## 2023-11-24 ENCOUNTER — Ambulatory Visit: Admitting: Sports Medicine

## 2023-11-26 NOTE — Telephone Encounter (Signed)
Message routed to PCP Gupta, Anjali L, MD  

## 2024-01-12 DIAGNOSIS — H52203 Unspecified astigmatism, bilateral: Secondary | ICD-10-CM | POA: Diagnosis not present

## 2024-01-12 DIAGNOSIS — H401132 Primary open-angle glaucoma, bilateral, moderate stage: Secondary | ICD-10-CM | POA: Diagnosis not present

## 2024-01-12 DIAGNOSIS — Z961 Presence of intraocular lens: Secondary | ICD-10-CM | POA: Diagnosis not present

## 2024-01-18 ENCOUNTER — Other Ambulatory Visit: Payer: Self-pay | Admitting: Adult Health

## 2024-02-23 DIAGNOSIS — R7303 Prediabetes: Secondary | ICD-10-CM | POA: Diagnosis not present

## 2024-02-23 DIAGNOSIS — D649 Anemia, unspecified: Secondary | ICD-10-CM | POA: Diagnosis not present

## 2024-02-23 DIAGNOSIS — I10 Essential (primary) hypertension: Secondary | ICD-10-CM | POA: Diagnosis not present

## 2024-02-23 LAB — BASIC METABOLIC PANEL WITH GFR
BUN: 13 (ref 4–21)
BUN: 13 (ref 4–21)
CO2: 26 — AB (ref 13–22)
CO2: 26 — AB (ref 13–22)
Chloride: 102 (ref 99–108)
Chloride: 102 (ref 99–108)
Creatinine: 1.1 (ref 0.6–1.3)
Creatinine: 1.1 (ref 0.6–1.3)
Glucose: 104
Glucose: 104
Potassium: 4.2 meq/L (ref 3.5–5.1)
Potassium: 4.2 meq/L (ref 3.5–5.1)
Sodium: 142 (ref 137–147)
Sodium: 142 (ref 137–147)

## 2024-02-23 LAB — COMPREHENSIVE METABOLIC PANEL WITH GFR
Calcium: 9.3 (ref 8.7–10.7)
Calcium: 9.3 (ref 8.7–10.7)
eGFR: 69
eGFR: 69

## 2024-02-23 LAB — HEMOGLOBIN A1C
Hemoglobin A1C: 6.2
Hemoglobin A1C: 6.2

## 2024-02-23 LAB — CBC AND DIFFERENTIAL
HCT: 45 (ref 41–53)
HCT: 45 (ref 41–53)
Hemoglobin: 15.1 (ref 13.5–17.5)
Hemoglobin: 15.1 (ref 13.5–17.5)
Platelets: 251 10*3/uL (ref 150–400)
Platelets: 251 10*3/uL (ref 150–400)
WBC: 6.8
WBC: 6.8

## 2024-02-23 LAB — CBC
RBC: 4.8 (ref 3.87–5.11)
RBC: 4.8 (ref 3.87–5.11)

## 2024-03-01 ENCOUNTER — Encounter: Payer: Self-pay | Admitting: Internal Medicine

## 2024-03-01 ENCOUNTER — Non-Acute Institutional Stay: Payer: Medicare Other | Admitting: Internal Medicine

## 2024-03-01 VITALS — BP 118/70 | HR 85 | Temp 98.1°F | Ht 71.0 in | Wt 167.0 lb

## 2024-03-01 DIAGNOSIS — R7303 Prediabetes: Secondary | ICD-10-CM

## 2024-03-01 DIAGNOSIS — K219 Gastro-esophageal reflux disease without esophagitis: Secondary | ICD-10-CM

## 2024-03-01 DIAGNOSIS — M7989 Other specified soft tissue disorders: Secondary | ICD-10-CM | POA: Diagnosis not present

## 2024-03-01 DIAGNOSIS — I10 Essential (primary) hypertension: Secondary | ICD-10-CM

## 2024-03-01 DIAGNOSIS — E782 Mixed hyperlipidemia: Secondary | ICD-10-CM | POA: Diagnosis not present

## 2024-03-01 NOTE — Progress Notes (Unsigned)
 Location:  [facility] features editor of Service:  Clinic (12)  Provider:   Code Status: *** Goals of Care:     11/23/2023    3:43 PM  Advanced Directives  Does Patient Have a Medical Advance Directive? Yes  Type of Estate agent of [place];Living will  Does patient want to make changes to medical advance directive? No - Patient declined  Copy of Healthcare Power of Attorney in Chart? Yes - validated most recent copy scanned in chart (See row information)     Chief Complaint  Patient presents with   Follow-up    Follow up with labs. Patient has concerns about gas    HPI: Patient is a 83 y.o. male seen today for medical management of chronic diseases.     Past Medical History:  Diagnosis Date   Allergy    seasonal   Arthritis    knees   Barrett's esophagus    Cataract    BILATERAL REMOVED   Gastric polyp    GERD (gastroesophageal reflux disease)    Glaucoma    Hemorrhoids    Hyperlipidemia    Hypertension    Pneumonia    as teenager   RBBB    RBBB    Vasovagal syncope    last episode was 2015-16; none since   Wears glasses     Past Surgical History:  Procedure Laterality Date   BIOPSY  01/16/2020   Procedure: BIOPSY;  Surgeon: Wilhelmenia Aloha Raddle., MD;  Location: [facility] ENDOSCOPY;  Service: Gastroenterology;;   CARDIAC CATHETERIZATION  1993   CATARACT EXTRACTION W/ INTRAOCULAR LENS  IMPLANT, BILATERAL  2021   COLONOSCOPY  2002; 2007   ENDOSCOPIC MUCOSAL RESECTION N/A 01/16/2020   Procedure: ENDOSCOPIC MUCOSAL RESECTION;  Surgeon: Wilhelmenia Aloha Raddle., MD;  Location: [facility] ENDOSCOPY;  Service: Gastroenterology;  Laterality: N/A;   ENDOSCOPIC MUCOSAL RESECTION N/A 02/03/2022   Procedure: ENDOSCOPIC MUCOSAL RESECTION;  Surgeon: Wilhelmenia Aloha Raddle., MD;  Location: [facility] ENDOSCOPY;  Service: Gastroenterology;  Laterality: N/A;   ENDOSCOPIC MUCOSAL RESECTION  03/05/2023   Procedure: ENDOSCOPIC MUCOSAL RESECTION;  Surgeon:  Wilhelmenia Aloha Raddle., MD;  Location: [facility] ENDOSCOPY;  Service: Gastroenterology;;   ESOPHAGOGASTRODUODENOSCOPY (EGD) WITH PROPOFOL  N/A 01/16/2020   Procedure: ESOPHAGOGASTRODUODENOSCOPY (EGD) WITH PROPOFOL ;  Surgeon: Wilhelmenia Aloha Raddle., MD;  Location: [facility] ENDOSCOPY;  Service: Gastroenterology;  Laterality: N/A;   ESOPHAGOGASTRODUODENOSCOPY (EGD) WITH PROPOFOL  N/A 02/03/2022   Procedure: ESOPHAGOGASTRODUODENOSCOPY (EGD) WITH PROPOFOL ;  Surgeon: Wilhelmenia Aloha Raddle., MD;  Location: [facility] ENDOSCOPY;  Service: Gastroenterology;  Laterality: N/A;   ESOPHAGOGASTRODUODENOSCOPY (EGD) WITH PROPOFOL  N/A 03/05/2023   Procedure: ESOPHAGOGASTRODUODENOSCOPY (EGD) WITH PROPOFOL ;  Surgeon: Wilhelmenia Aloha Raddle., MD;  Location: [facility] ENDOSCOPY;  Service: Gastroenterology;  Laterality: N/A;   GI RADIOFREQUENCY ABLATION  02/03/2022   Procedure: GI RADIOFREQUENCY ABLATION;  Surgeon: Wilhelmenia Aloha Raddle., MD;  Location: [facility] ENDOSCOPY;  Service: Gastroenterology;;   HEMOSTASIS CLIP PLACEMENT  01/16/2020   Procedure: HEMOSTASIS CLIP PLACEMENT;  Surgeon: Wilhelmenia Aloha Raddle., MD;  Location: [facility] ENDOSCOPY;  Service: Gastroenterology;;   HEMOSTASIS CLIP PLACEMENT  02/03/2022   Procedure: HEMOSTASIS CLIP PLACEMENT;  Surgeon: Wilhelmenia Aloha Raddle., MD;  Location: [facility] ENDOSCOPY;  Service: Gastroenterology;;   HEMOSTASIS CLIP PLACEMENT  03/05/2023   Procedure: HEMOSTASIS CLIP PLACEMENT;  Surgeon: Wilhelmenia Aloha Raddle., MD;  Location: [facility] ENDOSCOPY;  Service: Gastroenterology;;   HEMOSTASIS CONTROL  01/16/2020   Procedure: HEMOSTASIS CONTROL;  Surgeon: Wilhelmenia Aloha Raddle., MD;  Location: [facility] ENDOSCOPY;  Service: Gastroenterology;;   POLYPECTOMY  POLYPECTOMY  02/03/2022   Procedure: POLYPECTOMY;  Surgeon: Mansouraty, Aloha Raddle., MD;  Location: [facility] ENDOSCOPY;  Service: Gastroenterology;;   RADIOFREQUENCY ABLATION  03/05/2023   Procedure: RADIO FREQUENCY ABLATION;  Surgeon: Wilhelmenia Aloha Raddle., MD;  Location: [facility]  ENDOSCOPY;  Service: Gastroenterology;;   WALDEMAR  02/03/2022   Procedure: WALDEMAR;  Surgeon: Mansouraty, Aloha Raddle., MD;  Location: [facility] ENDOSCOPY;  Service: Gastroenterology;;  injection of epinephrine  into gastric polyp   SUBMUCOSAL LIFTING INJECTION  01/16/2020   Procedure: SUBMUCOSAL LIFTING INJECTION;  Surgeon: Wilhelmenia Aloha Raddle., MD;  Location: [facility] ENDOSCOPY;  Service: Gastroenterology;;   ROBLEY MEYER INJECTION  03/05/2023   Procedure: SUBMUCOSAL LIFTING INJECTION;  Surgeon: Wilhelmenia Aloha Raddle., MD;  Location: [facility] ENDOSCOPY;  Service: Gastroenterology;;   WISDOM TOOTH EXTRACTION      Allergies  Allergen Reactions   Black Pepper [Piper] Other (See Comments)    bloating   Ciprofloxacin Other (See Comments)    Bleeding-blood in stool    Nsaids     History of bleeding    Outpatient Encounter Medications as of 03/01/2024  Medication Sig   amLODipine  (NORVASC ) 2.5 MG tablet TAKE 1 TABLET BY MOUTH DAILY   b complex vitamins tablet Take 1 tablet by mouth daily.   bimatoprost (LUMIGAN) 0.01 % SOLN Place 1 drop into both eyes at bedtime.   cetirizine (ZYRTEC) 10 MG tablet Take 10 mg by mouth daily as needed for allergies.    Cholecalciferol (VITAMIN D ) 2000 UNITS CAPS Take 1 capsule (2,000 Units total) by mouth daily.   fluticasone (FLONASE) 50 MCG/ACT nasal spray Place 2 sprays into both nostrils daily as needed for allergies or rhinitis.   folic acid (FOLVITE) 800 MCG tablet Take 800 mcg by mouth daily.    Glucosamine-Chondroit-Vit C-Mn (GLUCOSAMINE CHONDR 1500 COMPLX PO) Take 2 tablets by mouth daily.    omeprazole  (PRILOSEC) 40 MG capsule Take 1 capsule (40 mg total) by mouth 2 (two) times daily before a meal.   rosuvastatin  (CRESTOR ) 5 MG tablet TAKE 1 TABLET BY MOUTH DAILY   timolol (TIMOPTIC) 0.5 % ophthalmic solution Place 1 drop into both eyes in the morning.   vitamin B-12 (CYANOCOBALAMIN) 1000 MCG tablet Take 1,000 mcg by mouth daily.   [DISCONTINUED]  predniSONE  (DELTASONE ) 20 MG tablet Take 1 tablet (20 mg total) by mouth daily with breakfast. With food   No facility-administered encounter medications on file as of 03/01/2024.    Review of Systems:  Review of Systems  Health Maintenance  Topic Date Due   COVID-19 Vaccine (9 - 2024-25 season) 07/14/2024 (Originally 09/01/2023)   INFLUENZA VACCINE  04/15/2024   Medicare Annual Wellness (AWV)  07/19/2024   DTaP/Tdap/Td (3 - Td or Tdap) 11/02/2030   Pneumococcal Vaccine: 50+ Years  Completed   Zoster Vaccines- Shingrix  Completed   HPV VACCINES  Aged Out   Meningococcal B Vaccine  Aged Out   Colonoscopy  Discontinued    Physical Exam: Vitals:   03/01/24 1321  BP: 118/70  Pulse: 85  Temp: 98.1 F (36.7 C)  SpO2: 99%  Weight: 167 lb (75.8 kg)  Height: 5' 11 (1.803 m)   Body mass index is 23.29 kg/m. Physical Exam  Labs reviewed: Basic Metabolic Panel: Recent Labs    08/18/23 0000 02/23/24 0000  NA 141 142  142  K 4.0 4.2  4.2  CL 102 102  102  CO2 26* 26*  26*  BUN 14 13  13   CREATININE 1.1 1.1  1.1  CALCIUM   9.2 9.3  9.3  TSH 2.72  --    Liver Function Tests: Recent Labs    08/18/23 0000  AST 26  ALT 20  ALKPHOS 89  ALBUMIN 3.9   No results for input(s): LIPASE, AMYLASE in the last 8760 hours. No results for input(s): AMMONIA in the last 8760 hours. CBC: Recent Labs    08/18/23 0000 02/23/24 0000  WBC 7.1 6.8  6.8  HGB 14.9 15.1  15.1  HCT 43 45  45  PLT 250 251  251   Lipid Panel: Recent Labs    08/18/23 0000  CHOL 153  HDL 46  LDLCALC 70  TRIG 187*   Lab Results  Component Value Date   HGBA1C 6.2 02/23/2024    Procedures since last visit: No results found.  Assessment/Plan 1. Swelling of left foot (Primary) *** - DG Foot Complete Left; Future  2. Essential hypertension ***  3. Mixed hyperlipidemia ***  4. Gastroesophageal reflux disease, unspecified whether esophagitis present ***  5.  Prediabetes ***    Labs/tests ordered:  * No order type specified * Next appt:  Visit date not found

## 2024-03-02 ENCOUNTER — Ambulatory Visit (HOSPITAL_BASED_OUTPATIENT_CLINIC_OR_DEPARTMENT_OTHER)
Admission: RE | Admit: 2024-03-02 | Discharge: 2024-03-02 | Disposition: A | Discharge status: Home / Self Care | Source: Ambulatory Visit | Attending: Internal Medicine | Admitting: Internal Medicine

## 2024-03-02 DIAGNOSIS — M7989 Other specified soft tissue disorders: Secondary | ICD-10-CM | POA: Insufficient documentation

## 2024-03-02 DIAGNOSIS — M79672 Pain in left foot: Secondary | ICD-10-CM | POA: Diagnosis not present

## 2024-03-02 NOTE — Telephone Encounter (Signed)
 PT is calling to schedule recall EGD for the hospital. Please advise.

## 2024-03-02 NOTE — Telephone Encounter (Signed)
 The pt has been advised we will call as soon as we have a date

## 2024-03-10 ENCOUNTER — Encounter: Payer: Self-pay | Admitting: Internal Medicine

## 2024-03-10 ENCOUNTER — Other Ambulatory Visit: Payer: Self-pay

## 2024-03-10 DIAGNOSIS — K22711 Barrett's esophagus with high grade dysplasia: Secondary | ICD-10-CM

## 2024-03-10 NOTE — Telephone Encounter (Signed)
 EGD RFA has been set up for 05/19/24 at 930 am at [facility] with [facility]

## 2024-03-10 NOTE — Telephone Encounter (Signed)
Message routed to PCP Gupta, Anjali L, MD  

## 2024-03-11 NOTE — Telephone Encounter (Signed)
EGD RFA scheduled, pt instructed and medications reviewed.  Patient instructions mailed to home.  Patient to call with any questions or concerns.

## 2024-03-17 NOTE — Telephone Encounter (Signed)
Message routed to PCP Gupta, Anjali L, MD  

## 2024-03-28 ENCOUNTER — Other Ambulatory Visit: Payer: Self-pay | Admitting: Gastroenterology

## 2024-03-30 ENCOUNTER — Ambulatory Visit: Payer: Self-pay | Admitting: Internal Medicine

## 2024-05-11 ENCOUNTER — Telehealth: Payer: Self-pay | Admitting: Gastroenterology

## 2024-05-11 ENCOUNTER — Encounter (HOSPITAL_COMMUNITY): Payer: Self-pay | Admitting: Gastroenterology

## 2024-05-11 NOTE — Telephone Encounter (Addendum)
 Procedure:Endoscopy Procedure date: 05/19/24 Procedure location: [facility] Arrival Time: 8:00 am Spoke with the patient Y/N: Yes Any prep concerns? No  Has the patient obtained the prep from the pharmacy ? No prep needed Do you have a care partner and transportation: Yes Any additional concerns? No

## 2024-05-19 ENCOUNTER — Ambulatory Visit (HOSPITAL_COMMUNITY): Admitting: Anesthesiology

## 2024-05-19 ENCOUNTER — Other Ambulatory Visit: Payer: Self-pay

## 2024-05-19 ENCOUNTER — Encounter (HOSPITAL_COMMUNITY): Payer: Self-pay | Admitting: Gastroenterology

## 2024-05-19 ENCOUNTER — Ambulatory Visit (HOSPITAL_COMMUNITY)
Admission: RE | Admit: 2024-05-19 | Discharge: 2024-05-19 | Disposition: A | Discharge status: Home / Self Care | Attending: Gastroenterology | Admitting: Gastroenterology

## 2024-05-19 ENCOUNTER — Encounter (HOSPITAL_COMMUNITY)
Admission: RE | Disposition: A | Discharge status: Home / Self Care | Payer: Self-pay | Source: Home / Self Care | Attending: Gastroenterology

## 2024-05-19 DIAGNOSIS — K317 Polyp of stomach and duodenum: Secondary | ICD-10-CM | POA: Diagnosis not present

## 2024-05-19 DIAGNOSIS — K22719 Barrett's esophagus with dysplasia, unspecified: Secondary | ICD-10-CM | POA: Diagnosis present

## 2024-05-19 DIAGNOSIS — K22710 Barrett's esophagus with low grade dysplasia: Secondary | ICD-10-CM | POA: Insufficient documentation

## 2024-05-19 DIAGNOSIS — K449 Diaphragmatic hernia without obstruction or gangrene: Secondary | ICD-10-CM | POA: Insufficient documentation

## 2024-05-19 DIAGNOSIS — K2289 Other specified disease of esophagus: Secondary | ICD-10-CM | POA: Insufficient documentation

## 2024-05-19 DIAGNOSIS — K2100 Gastro-esophageal reflux disease with esophagitis, without bleeding: Secondary | ICD-10-CM

## 2024-05-19 DIAGNOSIS — K3189 Other diseases of stomach and duodenum: Secondary | ICD-10-CM | POA: Diagnosis not present

## 2024-05-19 DIAGNOSIS — I10 Essential (primary) hypertension: Secondary | ICD-10-CM

## 2024-05-19 DIAGNOSIS — I2510 Atherosclerotic heart disease of native coronary artery without angina pectoris: Secondary | ICD-10-CM

## 2024-05-19 DIAGNOSIS — B9681 Helicobacter pylori [H. pylori] as the cause of diseases classified elsewhere: Secondary | ICD-10-CM

## 2024-05-19 DIAGNOSIS — K22711 Barrett's esophagus with high grade dysplasia: Secondary | ICD-10-CM

## 2024-05-19 HISTORY — PX: ESOPHAGOGASTRODUODENOSCOPY: SHX5428

## 2024-05-19 SURGERY — EGD (ESOPHAGOGASTRODUODENOSCOPY)
Anesthesia: Monitor Anesthesia Care

## 2024-05-19 MED ORDER — PHENYLEPHRINE HCL-NACL 20-0.9 MG/250ML-% IV SOLN
INTRAVENOUS | Status: DC | PRN
  Administered 2024-05-19: 15 ug/min via INTRAVENOUS

## 2024-05-19 MED ORDER — GLYCOPYRROLATE PF 0.2 MG/ML IJ SOSY
PREFILLED_SYRINGE | INTRAMUSCULAR | Status: DC | PRN
  Administered 2024-05-19: .2 mg via INTRAVENOUS

## 2024-05-19 MED ORDER — SUCRALFATE 1 G PO TABS: 1.0000 g | ORAL_TABLET | Freq: Four times a day (QID) | ORAL | 0 refills | Status: DC

## 2024-05-19 MED ORDER — PROPOFOL 10 MG/ML IV BOLUS
INTRAVENOUS | Status: AC
Start: 2024-05-19 — End: 2024-05-19
  Filled 2024-05-19: qty 20

## 2024-05-19 MED ORDER — LACTATED RINGERS IV SOLN: INTRAVENOUS | Status: DC | PRN

## 2024-05-19 MED ORDER — SODIUM CHLORIDE 0.9 % IV SOLN: INTRAVENOUS | Status: DC

## 2024-05-19 MED ORDER — ACETAMINOPHEN-CODEINE 120-12 MG/5ML PO SOLN: 10.0000 mL | Freq: Four times a day (QID) | ORAL | 0 refills | Status: DC | PRN

## 2024-05-19 MED ORDER — ACETYLCYSTEINE 20 % IN SOLN
RESPIRATORY_TRACT | Status: AC
  Filled 2024-05-19: qty 4

## 2024-05-19 MED ORDER — DEXAMETHASONE SODIUM PHOSPHATE 10 MG/ML IJ SOLN
INTRAMUSCULAR | Status: DC | PRN
  Administered 2024-05-19: 10 mg via INTRAVENOUS

## 2024-05-19 MED ORDER — ACETYLCYSTEINE 20 % IN SOLN
RESPIRATORY_TRACT | Status: DC | PRN
  Administered 2024-05-19: 60 mL via ORAL

## 2024-05-19 MED ORDER — PROPOFOL 500 MG/50ML IV EMUL
INTRAVENOUS | Status: DC | PRN
Start: 2024-05-19 — End: 2024-05-19
  Administered 2024-05-19: 100 ug/kg/min via INTRAVENOUS
  Administered 2024-05-19: 80 mg via INTRAVENOUS

## 2024-05-19 MED ORDER — SODIUM CHLORIDE (PF) 0.9 % IJ SOLN
PREFILLED_SYRINGE | INTRAMUSCULAR | Status: DC | PRN
  Administered 2024-05-19: 1 mL

## 2024-05-19 MED ORDER — OMEPRAZOLE 40 MG PO CPDR: 40.0000 mg | DELAYED_RELEASE_CAPSULE | Freq: Two times a day (BID) | ORAL | 12 refills | Status: AC

## 2024-05-19 MED ORDER — DEXMEDETOMIDINE HCL IN NACL 80 MCG/20ML IV SOLN
INTRAVENOUS | Status: DC | PRN
  Administered 2024-05-19: 8 ug via INTRAVENOUS

## 2024-05-19 MED ORDER — PHENYLEPHRINE 80 MCG/ML (10ML) SYRINGE FOR IV PUSH (FOR BLOOD PRESSURE SUPPORT)
PREFILLED_SYRINGE | INTRAVENOUS | Status: DC | PRN
  Administered 2024-05-19 (×2): 80 ug via INTRAVENOUS

## 2024-05-19 MED ORDER — PROPOFOL 1000 MG/100ML IV EMUL
INTRAVENOUS | Status: AC
  Filled 2024-05-19: qty 100

## 2024-05-19 MED ORDER — LIDOCAINE 2% (20 MG/ML) 5 ML SYRINGE
INTRAMUSCULAR | Status: DC | PRN
  Administered 2024-05-19: 100 mg via INTRAVENOUS

## 2024-05-19 MED ORDER — LIDOCAINE VISCOUS HCL 2 % MT SOLN: 30.0000 mL | Freq: Four times a day (QID) | OROMUCOSAL | 3 refills | Status: DC

## 2024-05-19 NOTE — Discharge Instructions (Signed)

## 2024-05-19 NOTE — Anesthesia Postprocedure Evaluation (Signed)
 Anesthesia Post Note  Patient: Ian Rowe  Procedure(s) Performed: EGD (ESOPHAGOGASTRODUODENOSCOPY)     Patient location during evaluation: Endoscopy Anesthesia Type: MAC Level of consciousness: oriented, awake and alert and awake Pain management: pain level controlled Vital Signs Assessment: post-procedure vital signs reviewed and stable Respiratory status: spontaneous breathing, nonlabored ventilation, respiratory function stable and patient connected to nasal cannula oxygen Cardiovascular status: blood pressure returned to baseline and stable Postop Assessment: no headache, no backache and no apparent nausea or vomiting Anesthetic complications: no   No notable events documented.  Last Vitals:  Vitals:   05/19/24 1020 05/19/24 1025  BP: 133/62 129/70  Pulse: 69 69  Resp: 17 (!) 23  Temp:    SpO2: 93% 94%    Last Pain:  Vitals:   05/19/24 1025  TempSrc:   PainSc: 0-No pain                 Garnette FORBES Skillern

## 2024-05-19 NOTE — Anesthesia Preprocedure Evaluation (Addendum)
 Anesthesia Evaluation  Patient identified by MRN, date of birth, ID band Patient awake    Reviewed: Allergy & Precautions, NPO status , Patient's Chart, lab work & pertinent test results  Airway Mallampati: II  TM Distance: >3 FB Neck ROM: Full    Dental  (+) Teeth Intact, Dental Advisory Given, Chipped,    Pulmonary neg pulmonary ROS   Pulmonary exam normal breath sounds clear to auscultation       Cardiovascular hypertension, Pt. on medications + CAD  Normal cardiovascular exam+ dysrhythmias (RBBB)  Rhythm:Regular Rate:Normal     Neuro/Psych negative neurological ROS  negative psych ROS   GI/Hepatic Neg liver ROS,GERD  Medicated and Controlled,,Barrett's esophagus with dysplasia   Endo/Other  negative endocrine ROS    Renal/GU negative Renal ROS     Musculoskeletal  (+) Arthritis ,    Abdominal   Peds  Hematology negative hematology ROS (+)   Anesthesia Other Findings Day of surgery medications reviewed with the patient.  Reproductive/Obstetrics                              Anesthesia Physical Anesthesia Plan  ASA: 3  Anesthesia Plan: MAC   Post-op Pain Management:    Induction: Intravenous  PONV Risk Score and Plan: 1 and TIVA and Treatment may vary due to age or medical condition  Airway Management Planned: Natural Airway and Simple Face Mask  Additional Equipment:   Intra-op Plan:   Post-operative Plan:   Informed Consent: I have reviewed the patients History and Physical, chart, labs and discussed the procedure including the risks, benefits and alternatives for the proposed anesthesia with the patient or authorized representative who has indicated his/her understanding and acceptance.     Dental advisory given  Plan Discussed with: CRNA and Anesthesiologist  Anesthesia Plan Comments:          Anesthesia Quick Evaluation

## 2024-05-19 NOTE — Op Note (Signed)
 [facility] Patient Name: Ian Rowe Procedure Date: 05/19/2024 MRN: [id number] Attending MD: Aloha Finner , MD, [id number] Date of Birth: 11/01/1940 CSN: [id number] Age: 83 Admit Type: Outpatient Procedure:                Upper GI endoscopy Indications:              Barrett's esophagus with low grade dysplasia,                            Follow-up of previous ablation treatment of                            Barrett's esophagus, Follow-up of gastric polyps Providers:                Aloha Finner, MD, Olam Riedel, RN, Jacquelyn                            Debbie Alert, RN, Lorrayne Kitty, Technician Referring MD:              Medicines:                Monitored Anesthesia Care Complications:            No immediate complications. Estimated Blood Loss:     Estimated blood loss was minimal. Procedure:                Pre-Anesthesia Assessment:                           - Prior to the procedure, a History and Physical                            was performed, and patient medications and                            allergies were reviewed. The patient's tolerance of                            previous anesthesia was also reviewed. The risks                            and benefits of the procedure and the sedation                            options and risks were discussed with the patient.                            All questions were answered, and informed consent                            was obtained. Prior Anticoagulants: The patient has                            taken no anticoagulant or antiplatelet agents. ASA  Grade Assessment: III - A patient with severe                            systemic disease. After reviewing the risks and                            benefits, the patient was deemed in satisfactory                            condition to undergo the procedure.                           After obtaining informed consent, the  endoscope was                            passed under direct vision. Throughout the                            procedure, the patient's blood pressure, pulse, and                            oxygen saturations were monitored continuously. The                            GIF-H190 ([id number]) Olympus endoscope was introduced                            through the mouth, and advanced to the second part                            of duodenum. The upper GI endoscopy was                            accomplished without difficulty. The patient                            tolerated the procedure. Scope In: Scope Out: Findings:      No gross lesions were noted in the entire esophagus.      The Z-line was irregular and was found 33 cm from the incisors. This is       significantly improved since the 2 prior Barrett's ablations. I now only       see the irregular Z-line. Biopsies were taken with a cold forceps for       histology to evaluate if underlying Barrett's is still present or not       (endoscopically does not appear that that is the case however). Focal       radiofrequency ablation of Barrett's esophagus was performed, since       there is a chance that biopsies could return showing evidence of       persisting Barrett's and we want to minimize procedures if possible.       With the endoscope in place, the position and extent of the Z-line       mucosa and the anatomic landmarks including top of gastric folds were  noted. Endoscopic visualization identified an ablation site. The mucosa       was irrigated with N-acetylcysteine  (Mucomyst ) 1% mixed with water .       Esophageal contents were suctioned. The endoscope was then removed from       the patient. The Barrx-90 radiofrequency ablation catheter was attached       to the tip of the endoscope. The endoscope with the attached       radiofrequency ablation catheter was then passed transorally under       direct vision into the esophagus  and advanced to the distal esophagus.       The radiofrequency ablation catheter was placed in contact with the       surface of the mucosa under direct visualization and energy was applied       twice at 12 J/cm2. Ablation was repeated in a likewise fashion to treat       circumferentially around the squamocolumnar junction. The areas of the       esophagus had been ablated were examined. Whitish changes of ablated       mucosa were present. The total number of energy applications for all       mucosal sites treated was 20. There was no evidence for perforation.      A 5 cm hiatal hernia was present.      Multiple 5 to 30 mm sessile and semi-sessile and pedunculated were found       in the cardia, in the gastric fundus, in the gastric body, at the       incisura and in the gastric antrum. One of the polyps in the cardia       showed evidence of active oozing (this was approximately 30 mm in size).       One of the polyps in the area of the cardia showed evidence of stigmata       of erosion and recent bleeding this was approximately 15 mm in size. The       smaller polyp was removed with a hot snare. Resection and retrieval were       complete. To prevent bleeding after the polypectomy, one hemostatic clip       was successfully placed (MR conditional). Clip manufacturer: [employer]. There was no bleeding at the end of the procedure. For the       larger lesion, preparations were made for mucosal resection. Demarcation       of the lesion was performed with high-definition white light and narrow       band imaging to clearly identify the boundaries of the lesion. A       1:100,000 solution of epinephrine  was injected to raise the lesion and       to decrease risk of bleeding (2 mL total). Snare mucosal resection was       performed. Resection and retrieval were complete. Resected tissue       margins were examined and clear of polyp tissue. To prevent bleeding       after  mucosal resection, two hemostatic clips were successfully placed       (MR conditional). Clip manufacturer: [employer]. There was no       bleeding at the end of the procedure.      Patchy mildly erythematous mucosa without bleeding was found in the       entire examined stomach. This has been biopsied previously  and negative       for H. pylori so not really done.      No gross lesions were noted in the duodenal bulb, in the first portion       of the duodenum and in the second portion of the duodenum. Impression:               - No gross lesions in the entire esophagus.                           - Barrett's esophagus not overtly present                            endoscopically. Irregular Z-line present at 33 cm                            from the incisors. Biopsied. Treated with                            radiofrequency ablation as noted above.                           - 5 cm hiatal hernia.                           - Multiple gastric polyps. 1 with stigmata of                            active oozing and 1 with stigmata of recent                            bleeding. Both were resected and retrieved. Clips                            (MR conditional) was placed.                           - Erythematous mucosa in the stomach. Previously                            biopsied and negative for H. pylori so not redone.                           - No gross lesions in the duodenal bulb, in the                            first portion of the duodenum and in the second                            portion of the duodenum. Moderate Sedation:      Not Applicable - Patient had care per Anesthesia. Recommendation:           - The patient will be observed post-procedure,  until all discharge criteria are met.                           - Discharge patient to home.                           - Patient has a contact number available for                             emergencies. The signs and symptoms of potential                            delayed complications were discussed with the                            patient. Return to normal activities tomorrow.                            Written discharge instructions were provided to the                            patient.                           - Clear liquid diet for 24 hours. Then, full liquid                            diet for 24-48 hours. Then, soft diet for 1 week                            thereafter. Then advance your diet thereafter to                            regular.                           - Continue your PPI 40 mg twice daily for the next                            3 months. This has been sent to your normal                            pharmacy.                           - You will be prescribed a lidocaine /antiacid                            mixture that you can take up to 4 times per day as                            needed (prescription has been sent to your  pharmacy) - take no matter what for at least first                            3-days. This has been sent to [facility].                           - You will be prescribed liquid Tylenol  with                            codeine  that you can use up to 4 times daily for                            the next 72 hours if needed (prescription to be                            sent to your pharmacy) - I recommend using this                            even if you do not have pain for the first 2-3                            days. This has been sent to [facility].                           - You will be prescribed Sucralfate  Suspension and                            you will take this 4 times daily (make sure you are                            not taking any other medication 1 hour before or 1                            hour after this medication is taken) to allow                             coating and healing of your esophagus - this needs                            to be taken for at least 27-month (you should have                            refills available but call if you run out). I have                            sent this as a tablet version for cost purposes.                            You can crush it and dissolve it in 15 to 20 mL of  fluid. This has been sent to your normal pharmacy.                           - Await pathology results.                           - Observe patient's clinical course.                           - The findings and recommendations were discussed                            with the patient.                           - The findings and recommendations were discussed                            with the patient's family. Procedure Code(s):        --- Professional ---                           [phone], 59, Esophagogastroduodenoscopy, flexible,                            transoral; with endoscopic mucosal resection                           43270, Esophagogastroduodenoscopy, flexible,                            transoral; with ablation of tumor(s), polyp(s), or                            other lesion(s) (includes pre- and post-dilation                            and guide wire passage, when performed)                           43251, 59, Esophagogastroduodenoscopy, flexible,                            transoral; with removal of tumor(s), polyp(s), or                            other lesion(s) by snare technique                           43239, 59, Esophagogastroduodenoscopy, flexible,                            transoral; with biopsy, single or multiple Diagnosis Code(s):        --- Professional ---                           K22.89, Other specified  disease of esophagus                           K44.9, Diaphragmatic hernia without obstruction or                            gangrene                           K31.7, Polyp  of stomach and duodenum                           K31.89, Other diseases of stomach and duodenum                           K22.710, Barrett's esophagus with low grade                            dysplasia                           Z09, Encounter for follow-up examination after                            completed treatment for conditions other than                            malignant neoplasm CPT copyright 2022 American Medical Association. All rights reserved. The codes documented in this report are preliminary and upon coder review may  be revised to meet current compliance requirements. Aloha Finner, MD 05/19/2024 10:21:29 AM Number of Addenda: 0

## 2024-05-19 NOTE — H&P (Addendum)
 GASTROENTEROLOGY PROCEDURE H&P NOTE   Primary Care Physician: Charlanne Fredia CROME, MD  HPI: Ian Rowe is a 83 y.o. male who presents for EGD for followup of Barrett's LGD s/p RFA ablations.  Past Medical History:  Diagnosis Date   Allergy    seasonal   Arthritis    knees   Barrett's esophagus    Cataract    BILATERAL REMOVED   Gastric polyp    GERD (gastroesophageal reflux disease)    Glaucoma    Hemorrhoids    Hyperlipidemia    Hypertension    Pneumonia    as teenager   RBBB    RBBB    Vasovagal syncope    last episode was 2015-16; none since   Wears glasses    Past Surgical History:  Procedure Laterality Date   BIOPSY  01/16/2020   Procedure: BIOPSY;  Surgeon: Wilhelmenia Aloha Raddle., MD;  Location: [facility] ENDOSCOPY;  Service: Gastroenterology;;   CARDIAC CATHETERIZATION  1993   CATARACT EXTRACTION W/ INTRAOCULAR LENS  IMPLANT, BILATERAL  2021   COLONOSCOPY  2002; 2007   ENDOSCOPIC MUCOSAL RESECTION N/A 01/16/2020   Procedure: ENDOSCOPIC MUCOSAL RESECTION;  Surgeon: Wilhelmenia Aloha Raddle., MD;  Location: [facility] ENDOSCOPY;  Service: Gastroenterology;  Laterality: N/A;   ENDOSCOPIC MUCOSAL RESECTION N/A 02/03/2022   Procedure: ENDOSCOPIC MUCOSAL RESECTION;  Surgeon: Wilhelmenia Aloha Raddle., MD;  Location: [facility] ENDOSCOPY;  Service: Gastroenterology;  Laterality: N/A;   ENDOSCOPIC MUCOSAL RESECTION  03/05/2023   Procedure: ENDOSCOPIC MUCOSAL RESECTION;  Surgeon: Wilhelmenia Aloha Raddle., MD;  Location: [facility] ENDOSCOPY;  Service: Gastroenterology;;   ESOPHAGOGASTRODUODENOSCOPY (EGD) WITH PROPOFOL  N/A 01/16/2020   Procedure: ESOPHAGOGASTRODUODENOSCOPY (EGD) WITH PROPOFOL ;  Surgeon: Wilhelmenia Aloha Raddle., MD;  Location: [facility] ENDOSCOPY;  Service: Gastroenterology;  Laterality: N/A;   ESOPHAGOGASTRODUODENOSCOPY (EGD) WITH PROPOFOL  N/A 02/03/2022   Procedure: ESOPHAGOGASTRODUODENOSCOPY (EGD) WITH PROPOFOL ;  Surgeon: Wilhelmenia Aloha Raddle., MD;  Location: [facility] ENDOSCOPY;  Service:  Gastroenterology;  Laterality: N/A;   ESOPHAGOGASTRODUODENOSCOPY (EGD) WITH PROPOFOL  N/A 03/05/2023   Procedure: ESOPHAGOGASTRODUODENOSCOPY (EGD) WITH PROPOFOL ;  Surgeon: Wilhelmenia Aloha Raddle., MD;  Location: [facility] ENDOSCOPY;  Service: Gastroenterology;  Laterality: N/A;   GI RADIOFREQUENCY ABLATION  02/03/2022   Procedure: GI RADIOFREQUENCY ABLATION;  Surgeon: Wilhelmenia Aloha Raddle., MD;  Location: [facility] ENDOSCOPY;  Service: Gastroenterology;;   HEMOSTASIS CLIP PLACEMENT  01/16/2020   Procedure: HEMOSTASIS CLIP PLACEMENT;  Surgeon: Wilhelmenia Aloha Raddle., MD;  Location: [facility] ENDOSCOPY;  Service: Gastroenterology;;   HEMOSTASIS CLIP PLACEMENT  02/03/2022   Procedure: HEMOSTASIS CLIP PLACEMENT;  Surgeon: Wilhelmenia Aloha Raddle., MD;  Location: [facility] ENDOSCOPY;  Service: Gastroenterology;;   HEMOSTASIS CLIP PLACEMENT  03/05/2023   Procedure: HEMOSTASIS CLIP PLACEMENT;  Surgeon: Wilhelmenia Aloha Raddle., MD;  Location: [facility] ENDOSCOPY;  Service: Gastroenterology;;   HEMOSTASIS CONTROL  01/16/2020   Procedure: HEMOSTASIS CONTROL;  Surgeon: Wilhelmenia Aloha Raddle., MD;  Location: [facility] ENDOSCOPY;  Service: Gastroenterology;;   POLYPECTOMY     POLYPECTOMY  02/03/2022   Procedure: POLYPECTOMY;  Surgeon: Wilhelmenia Aloha Raddle., MD;  Location: [facility] ENDOSCOPY;  Service: Gastroenterology;;   RADIOFREQUENCY ABLATION  03/05/2023   Procedure: RADIO FREQUENCY ABLATION;  Surgeon: Wilhelmenia Aloha Raddle., MD;  Location: [facility] ENDOSCOPY;  Service: Gastroenterology;;   WALDEMAR  02/03/2022   Procedure: WALDEMAR;  Surgeon: Wilhelmenia Aloha Raddle., MD;  Location: [facility] ENDOSCOPY;  Service: Gastroenterology;;  injection of epinephrine  into gastric polyp   SUBMUCOSAL LIFTING INJECTION  01/16/2020   Procedure: SUBMUCOSAL LIFTING INJECTION;  Surgeon: Wilhelmenia Aloha Raddle., MD;  Location: [facility] ENDOSCOPY;  Service: Gastroenterology;;  SUBMUCOSAL LIFTING INJECTION  03/05/2023   Procedure: SUBMUCOSAL LIFTING INJECTION;  Surgeon: Wilhelmenia  Aloha Raddle., MD;  Location: [facility]OSCOPY;  Service: Gastroenterology;;   WISDOM TOOTH EXTRACTION     No current facility-administered medications for this encounter.   No current facility-administered medications for this encounter. Allergies  Allergen Reactions   Black Pepper [[place]] Other (See Comments)    bloating   Ciprofloxacin Other (See Comments)    Bleeding-blood in stool    Nsaids     History of bleeding   Family History  Problem Relation Age of Onset   Alzheimer's disease Mother    Heart disease Father    Colon cancer Neg Hx    Colon polyps Neg Hx    Esophageal cancer Neg Hx    Stomach cancer Neg Hx    Rectal cancer Neg Hx    Inflammatory bowel disease Neg Hx    Liver disease Neg Hx    Pancreatic cancer Neg Hx    Social History   Socioeconomic History   Marital status: Married    Spouse name: Avelina   Number of children: 2   Years of education: Not on file   Highest education level: Doctorate  Occupational History   Occupation: Retired Interior and spatial designer  Tobacco Use   Smoking status: Never   Smokeless tobacco: Never  Vaping Use   Vaping status: Never Used  Substance and Sexual Activity   Alcohol use: Yes    Alcohol/week: 10.0 standard drinks of alcohol    Types: 7 Shots of liquor, 3 Glasses of wine per week    Comment: 2 scotch at bedtime nightly, occ glass of wine    Drug use: No   Sexual activity: Not on file  Other Topics Concern   Not on file  Social History Narrative   Moved to [facility] 2014.   Married   Never smoked   Alcohol - scotch or wine nightly   Walks 150 minutes per week.    POA   Social Drivers of Corporate investment banker Strain: Low Risk  (11/23/2023)   Overall Financial Resource Strain (CARDIA)    Difficulty of Paying Living Expenses: Not hard at all  Food Insecurity: No Food Insecurity (11/23/2023)   Hunger Vital Sign    Worried About Running Out of Food in the Last Year: Never true    Ran Out of  Food in the Last Year: Never true  Transportation Needs: No Transportation Needs (11/23/2023)   PRAPARE - Administrator, Civil Service (Medical): No    Lack of Transportation (Non-Medical): No  Physical Activity: Sufficiently Active (11/23/2023)   Exercise Vital Sign    Days of Exercise per Week: 7 days    Minutes of Exercise per Session: 30 min  Stress: No Stress Concern Present (11/23/2023)   [employer] of Occupational Health - Occupational Stress Questionnaire    Feeling of Stress : Not at all  Social Connections: Socially Isolated (11/23/2023)   Social Connection and Isolation Panel    Frequency of Communication with Friends and Family: Once a week    Frequency of Social Gatherings with Friends and Family: Once a week    Attends Religious Services: Never    Database administrator or Organizations: No    Attends Banker Meetings: Not on file    Marital Status: Married  Intimate Partner Violence: Not At Risk (01/12/2018)   Humiliation, Afraid, Rape, and Kick questionnaire    Fear of Current  or Ex-Partner: No    Emotionally Abused: No    Physically Abused: No    Sexually Abused: No    Physical Exam: There were no vitals filed for this visit. There is no height or weight on file to calculate BMI. GEN: NAD EYE: Sclerae anicteric ENT: MMM CV: Non-tachycardic GI: Soft, NT/ND NEURO:  Alert & Oriented x 3  Lab Results: No results for input(s): WBC, HGB, HCT, PLT in the last 72 hours. BMET No results for input(s): NA, K, CL, CO2, GLUCOSE, BUN, CREATININE, CALCIUM  in the last 72 hours. LFT No results for input(s): PROT, ALBUMIN, AST, ALT, ALKPHOS, BILITOT, BILIDIR, IBILI in the last 72 hours. PT/INR No results for input(s): LABPROT, INR in the last 72 hours.   Impression / Plan: This is a 83 y.o.male who presents for EGD for followup of Barrett's LGD s/p RFA ablations.  The risks and benefits of  endoscopic evaluation/treatment were discussed with the patient and/or family; these include but are not limited to the risk of perforation, infection, bleeding, missed lesions, lack of diagnosis, severe illness requiring hospitalization, as well as anesthesia and sedation related illnesses.  The patient's history has been reviewed, patient examined, no change in status, and deemed stable for procedure.  The patient and/or family is agreeable to proceed.    Aloha Finner, MD [facility] Office # [phone]

## 2024-05-19 NOTE — Transfer of Care (Signed)
 Immediate Anesthesia Transfer of Care Note  Patient: Ian Rowe  Procedure(s) Performed: EGD (ESOPHAGOGASTRODUODENOSCOPY)  Patient Location: PACU and Endoscopy Unit  Anesthesia Type:MAC  Level of Consciousness: awake, alert , oriented, and patient cooperative  Airway & Oxygen Therapy: Patient Spontanous Breathing and Patient connected to face mask oxygen  Post-op Assessment: Report given to RN and Post -op Vital signs reviewed and stable  Post vital signs: Reviewed and stable  Last Vitals:  Vitals Value Taken Time  BP 116/62 05/19/24 10:06  Temp    Pulse 69 05/19/24 10:08  Resp 25 05/19/24 10:08  SpO2 98 % 05/19/24 10:08  Vitals shown include unfiled device data.  Last Pain:  Vitals:   05/19/24 0821  TempSrc: Temporal         Complications: No notable events documented.

## 2024-05-19 NOTE — Anesthesia Procedure Notes (Signed)
 Procedure Name: MAC Date/Time: 05/19/2024 9:24 AM  Performed by: Nada Corean CROME, CRNAPre-anesthesia Checklist: Patient identified, Emergency Drugs available, Suction available, Patient being monitored and Timeout performed Patient Re-evaluated:Patient Re-evaluated prior to induction Oxygen Delivery Method: Non-rebreather mask Induction Type: IV induction Placement Confirmation: positive ETCO2 Dental Injury: Teeth and Oropharynx as per pre-operative assessment  Comments: POM mask used

## 2024-05-22 ENCOUNTER — Encounter (HOSPITAL_COMMUNITY): Payer: Self-pay | Admitting: Gastroenterology

## 2024-05-23 ENCOUNTER — Ambulatory Visit: Payer: Self-pay | Admitting: Gastroenterology

## 2024-05-24 LAB — SURGICAL PATHOLOGY

## 2024-05-24 NOTE — Progress Notes (Signed)
 Recall has been entered

## 2024-07-13 DIAGNOSIS — H401132 Primary open-angle glaucoma, bilateral, moderate stage: Secondary | ICD-10-CM | POA: Diagnosis not present

## 2024-08-08 ENCOUNTER — Telehealth: Payer: Self-pay | Admitting: Gastroenterology

## 2024-08-08 ENCOUNTER — Encounter: Payer: Self-pay | Admitting: Physician Assistant

## 2024-08-08 ENCOUNTER — Ambulatory Visit: Admitting: Physician Assistant

## 2024-08-08 VITALS — BP 130/58 | HR 62 | Ht 67.0 in | Wt 170.0 lb

## 2024-08-08 DIAGNOSIS — R159 Full incontinence of feces: Secondary | ICD-10-CM

## 2024-08-08 DIAGNOSIS — Z860100 Personal history of colon polyps, unspecified: Secondary | ICD-10-CM

## 2024-08-08 DIAGNOSIS — L309 Dermatitis, unspecified: Secondary | ICD-10-CM | POA: Diagnosis not present

## 2024-08-08 DIAGNOSIS — R194 Change in bowel habit: Secondary | ICD-10-CM | POA: Diagnosis not present

## 2024-08-08 MED ORDER — NYSTATIN 100000 UNIT/GM EX CREA
1.0000 | TOPICAL_CREAM | Freq: Two times a day (BID) | CUTANEOUS | 1 refills | Status: AC
Start: 2024-08-08 — End: ?

## 2024-08-08 NOTE — Progress Notes (Signed)
 Ellouise Console, PA-C [address] [place], [place]  [address] Phone: [phone]   Primary Care Physician: Charlanne Fredia CROME, MD  Primary Gastroenterologist:  Ellouise Console, PA-C / Elspeth Naval, MD   Chief Complaint:  Anal Leakage; change in bowel habits   HPI:   Discussed the use of AI scribe software for clinical note transcription with the patient, who gave verbal consent to proceed. History of Present Illness Ian Rowe is an 83 year old male who presents with anal leakage and changes in bowel habits.  He has experienced anal leakage characterized by the discharge of brown fluid intermittently over the past week, causing itching. The leakage occurred six or seven times but has not been present in the last two or three days. No changes in diet or new medications have been noted. No bright red blood in stool.  He has noticed a change in his bowel habits, with an increase from his usual two bowel movements per day to sometimes having a third one later in the day. He maintains a high fiber diet, consuming a lot of vegetables and nuts.  He experiences frequent belching, which he attributes to swallowing air and certain foods that chemically react in his stomach. The belching occurs several hours after eating and can last for about twenty minutes.  His past medical history includes routine colonoscopies since age 35.  History of Barrett's esophagus with low-grade dysplasia.  He had ablation treatment of Barrett's esophagus by Dr. Wilhelmenia in the past year.  History of gastric polyps.  Last EGD 05/19/2024 showed no esophageal lesions.  Barrett's resolved post radiofrequency ablation.  5 cm hiatal hernia.  Multiple gastric polyps.  Previous biopsies negative for H. pylori.  He was recommended to repeat EGD every 6 months for 2 years.   05/2019 Last Colonoscopy by Dr. Naval: 2 diminutive polyps: 1 tubular adenoma and 1 colon mucosal polyp; Small internal hemorrhoids.  No  repeat colonoscopy due to advanced age.  01/2016 colonoscopy: 20 mm tubulovillous adenoma polyp removed from sigmoid colon.  3 other smaller 4 mm to 6 mm tubular adenoma polyps removed.  Total of 4 polyps removed.  Nonbleeding internal hemorrhoids.  2007: No polyps 2002: 1 small polyp.  Current Outpatient Medications  Medication Sig Dispense Refill   amLODipine  (NORVASC ) 2.5 MG tablet TAKE 1 TABLET BY MOUTH DAILY 90 tablet 5   b complex vitamins tablet Take 1 tablet by mouth daily.     cetirizine (ZYRTEC) 10 MG tablet Take 10 mg by mouth daily as needed for allergies.      Cholecalciferol (VITAMIN D ) 2000 UNITS CAPS Take 1 capsule (2,000 Units total) by mouth daily. 30 capsule 3   fluticasone (FLONASE) 50 MCG/ACT nasal spray Place 2 sprays into both nostrils daily as needed for allergies or rhinitis.     folic acid (FOLVITE) 800 MCG tablet Take 800 mcg by mouth daily.      Glucosamine-Chondroit-Vit C-Mn (GLUCOSAMINE CHONDR 1500 COMPLX PO) Take 2 tablets by mouth daily.      latanoprost (XALATAN) 0.005 % ophthalmic solution Place 1 drop into both eyes at bedtime.     nystatin  cream (MYCOSTATIN ) Apply 1 Application topically 2 (two) times daily. 30 g 1   omeprazole  (PRILOSEC) 40 MG capsule Take 1 capsule (40 mg total) by mouth 2 (two) times daily before a meal. Twice daily for 2 months then may decrease to once daily. 60 capsule 12   rosuvastatin  (CRESTOR ) 5 MG tablet  TAKE 1 TABLET BY MOUTH DAILY 90 tablet 3   timolol (TIMOPTIC) 0.5 % ophthalmic solution Place 1 drop into both eyes in the morning.     vitamin B-12 (CYANOCOBALAMIN) 1000 MCG tablet Take 1,000 mcg by mouth daily.     No current facility-administered medications for this visit.    Allergies as of 08/08/2024 - Review Complete 08/08/2024  Allergen Reaction Noted   Black pepper [piper] Other (See Comments) 12/08/2013   Ciprofloxacin Other (See Comments) 11/17/2019   Nsaids  04/01/2013    Past Medical History:  Diagnosis Date    Allergy    seasonal   Arthritis    knees   Barrett's esophagus    Cataract    BILATERAL REMOVED   Gastric polyp    GERD (gastroesophageal reflux disease)    Glaucoma    Hemorrhoids    Hyperlipidemia    Hypertension    Pneumonia    as teenager   RBBB    Vasovagal syncope    last episode was 2015-16; none since   Wears glasses     Past Surgical History:  Procedure Laterality Date   BIOPSY  01/16/2020   Procedure: BIOPSY;  Surgeon: Wilhelmenia Aloha Raddle., MD;  Location: [facility] ENDOSCOPY;  Service: Gastroenterology;;   CARDIAC CATHETERIZATION  1993   CATARACT EXTRACTION W/ INTRAOCULAR LENS  IMPLANT, BILATERAL  2021   COLONOSCOPY  2002; 2007   ENDOSCOPIC MUCOSAL RESECTION N/A 01/16/2020   Procedure: ENDOSCOPIC MUCOSAL RESECTION;  Surgeon: Wilhelmenia Aloha Raddle., MD;  Location: [facility] ENDOSCOPY;  Service: Gastroenterology;  Laterality: N/A;   ENDOSCOPIC MUCOSAL RESECTION N/A 02/03/2022   Procedure: ENDOSCOPIC MUCOSAL RESECTION;  Surgeon: Wilhelmenia Aloha Raddle., MD;  Location: [facility] ENDOSCOPY;  Service: Gastroenterology;  Laterality: N/A;   ENDOSCOPIC MUCOSAL RESECTION  03/05/2023   Procedure: ENDOSCOPIC MUCOSAL RESECTION;  Surgeon: Wilhelmenia Aloha Raddle., MD;  Location: [facility] ENDOSCOPY;  Service: Gastroenterology;;   ESOPHAGOGASTRODUODENOSCOPY N/A 05/19/2024   Procedure: EGD (ESOPHAGOGASTRODUODENOSCOPY);  Surgeon: Wilhelmenia Aloha Raddle., MD;  Location: [facility] ENDOSCOPY;  Service: Gastroenterology;  Laterality: N/A;  RFA   ESOPHAGOGASTRODUODENOSCOPY (EGD) WITH PROPOFOL  N/A 01/16/2020   Procedure: ESOPHAGOGASTRODUODENOSCOPY (EGD) WITH PROPOFOL ;  Surgeon: Wilhelmenia Aloha Raddle., MD;  Location: [facility] ENDOSCOPY;  Service: Gastroenterology;  Laterality: N/A;   ESOPHAGOGASTRODUODENOSCOPY (EGD) WITH PROPOFOL  N/A 02/03/2022   Procedure: ESOPHAGOGASTRODUODENOSCOPY (EGD) WITH PROPOFOL ;  Surgeon: Wilhelmenia Aloha Raddle., MD;  Location: [facility] ENDOSCOPY;  Service: Gastroenterology;  Laterality: N/A;    ESOPHAGOGASTRODUODENOSCOPY (EGD) WITH PROPOFOL  N/A 03/05/2023   Procedure: ESOPHAGOGASTRODUODENOSCOPY (EGD) WITH PROPOFOL ;  Surgeon: Wilhelmenia Aloha Raddle., MD;  Location: [facility] ENDOSCOPY;  Service: Gastroenterology;  Laterality: N/A;   GI RADIOFREQUENCY ABLATION  02/03/2022   Procedure: GI RADIOFREQUENCY ABLATION;  Surgeon: Wilhelmenia Aloha Raddle., MD;  Location: [facility] ENDOSCOPY;  Service: Gastroenterology;;   HEMOSTASIS CLIP PLACEMENT  01/16/2020   Procedure: HEMOSTASIS CLIP PLACEMENT;  Surgeon: Wilhelmenia Aloha Raddle., MD;  Location: [facility] ENDOSCOPY;  Service: Gastroenterology;;   HEMOSTASIS CLIP PLACEMENT  02/03/2022   Procedure: HEMOSTASIS CLIP PLACEMENT;  Surgeon: Wilhelmenia Aloha Raddle., MD;  Location: [facility] ENDOSCOPY;  Service: Gastroenterology;;   HEMOSTASIS CLIP PLACEMENT  03/05/2023   Procedure: HEMOSTASIS CLIP PLACEMENT;  Surgeon: Wilhelmenia Aloha Raddle., MD;  Location: [facility] ENDOSCOPY;  Service: Gastroenterology;;   HEMOSTASIS CONTROL  01/16/2020   Procedure: HEMOSTASIS CONTROL;  Surgeon: Wilhelmenia Aloha Raddle., MD;  Location: [facility] ENDOSCOPY;  Service: Gastroenterology;;   POLYPECTOMY     POLYPECTOMY  02/03/2022   Procedure: POLYPECTOMY;  Surgeon: Wilhelmenia Aloha Raddle., MD;  Location: [facility] ENDOSCOPY;  Service:  Gastroenterology;;   RADIOFREQUENCY ABLATION  03/05/2023   Procedure: RADIO FREQUENCY ABLATION;  Surgeon: Wilhelmenia Aloha Raddle., MD;  Location: [facility] ENDOSCOPY;  Service: Gastroenterology;;   WALDEMAR  02/03/2022   Procedure: WALDEMAR;  Surgeon: Mansouraty, Aloha Raddle., MD;  Location: [facility] ENDOSCOPY;  Service: Gastroenterology;;  injection of epinephrine  into gastric polyp   SUBMUCOSAL LIFTING INJECTION  01/16/2020   Procedure: SUBMUCOSAL LIFTING INJECTION;  Surgeon: Wilhelmenia Aloha Raddle., MD;  Location: [facility] ENDOSCOPY;  Service: Gastroenterology;;   ROBLEY MEYER INJECTION  03/05/2023   Procedure: SUBMUCOSAL LIFTING INJECTION;  Surgeon: Wilhelmenia Aloha Raddle., MD;  Location: [facility] ENDOSCOPY;   Service: Gastroenterology;;   WISDOM TOOTH EXTRACTION      Review of Systems:    All systems reviewed and negative except where noted in HPI.    Physical Exam:  BP (!) 130/58   Pulse 62   Ht 5' 7 (1.702 m)   Wt 170 lb (77.1 kg)   SpO2 99%   BMI 26.63 kg/m  No LMP for male patient.  General: Well-nourished, well-developed in no acute distress.  Lungs: Clear to auscultation bilaterally. Non-labored. Heart: Regular rate and rhythm, no murmurs rubs or gallops.  Abdomen: Bowel sounds are normal; Abdomen is Soft; No hepatosplenomegaly, masses or hernias;  No Abdominal Tenderness; No guarding or rebound tenderness. Neuro: Alert and oriented x 3.  Grossly intact.  Psych: Alert and cooperative, normal mood and affect. Rectal: Moderate perianal dermatitis, suspect candidiasis.  There are no external hemorrhoids.   No internal rectal masses or tenderness.  Stool is brown and Hemoccult negative.  Chaperone for Exam:  Alethea Blocker, CMA   Imaging Studies: No results found.  Labs: CBC    Component Value Date/Time   WBC 6.8 02/23/2024 0000   WBC 6.8 02/23/2024 0000   WBC 7.6 01/07/2022 1003   RBC 4.8 02/23/2024 0000   RBC 4.80 02/23/2024 0000   HGB 15.1 02/23/2024 0000   HGB 15.1 02/23/2024 0000   HCT 45 02/23/2024 0000   HCT 45 02/23/2024 0000   PLT 251 02/23/2024 0000   PLT 251 02/23/2024 0000   MCV 92.1 01/07/2022 1003   MCH 31.6 11/02/2020 1525   MCHC 34.4 01/07/2022 1003   RDW 13.4 01/07/2022 1003   LYMPHSABS 1.2 12/08/2013 1031   MONOABS 0.9 12/08/2013 1031   EOSABS 0.2 12/08/2013 1031   BASOSABS 0.1 12/08/2013 1031    CMP     Component Value Date/Time   NA 142 02/23/2024 0000   NA 142 02/23/2024 0000   K 4.2 02/23/2024 0000   K 4.2 02/23/2024 0000   CL 102 02/23/2024 0000   CL 102 02/23/2024 0000   CO2 26 (A) 02/23/2024 0000   CO2 26 (A) 02/23/2024 0000   GLUCOSE 140 (H) 11/02/2020 1525   BUN 13 02/23/2024 0000   BUN 13 02/23/2024 0000   CREATININE 1.1  02/23/2024 0000   CREATININE 1.1 02/23/2024 0000   CREATININE 1.16 11/02/2020 1525   CALCIUM  9.3 02/23/2024 0000   CALCIUM  9.3 02/23/2024 0000   PROT 6.5 11/02/2020 1525   ALBUMIN 3.9 08/18/2023 0000   AST 26 08/18/2023 0000   ALT 20 08/18/2023 0000   ALKPHOS 89 08/18/2023 0000   BILITOT 0.9 11/02/2020 1525   GFRNONAA 65.80 01/24/2021 0000   GFRNONAA >60 11/02/2020 1525   GFRAA 76.26 01/24/2021 0000       Assessment and Plan:   Ian Rowe is a 83 y.o. y/o male presents for evaluation of: Assessment & Plan 1.  Rectal leakage and pruritus with increased bowel movement frequency Intermittent rectal leakage and pruritus. No hematochezia. Increased bowel movement frequency noted. No recent medication changes. Possible dietary or age-related changes. No evidence of infection or serious pathology.  - Start OTC Metamucil 1 packet in a drink once daily.  2.  Perianal candidiasis - Rx nystatin  cream apply twice daily for 2 weeks. - Keep the area as clean and dry as possible.  3.  History of Barrett's esophagus with low-grade dysplasia, post-endoscopic therapy, under surveillance. Barrett's esophagus treated with endoscopic therapy. Last endoscopy three months ago (05/2024). Surveillance plan includes repeat endoscopy every 6 months for 2 years. - 25-month repeat endoscopy will be due March 2026.  4.  History of colon polyps, post-polypectomy Last colonoscopy in 2020 at age 17 showed 1 diminutive adenomatous polyp removed. No further colonoscopies recommended due to age-related risks.  Previous routine colonoscopies have reduced colon cancer risk. - No further surveillance colonoscopies are recommended due to a advanced age and current guidelines. - He has no alarm symptoms such as rectal bleeding, anemia, weight loss to warrant diagnostic colonoscopy.   Ellouise Console, PA-C  Follow up as needed if symptoms worsen or persist.

## 2024-08-08 NOTE — Telephone Encounter (Signed)
 Inbound call from patient stating that he is not sure what to do, due to him having symptoms of what could possible be colon cancer. Patient is requesting a call back to advise him further. Please advise.

## 2024-08-08 NOTE — Patient Instructions (Addendum)
 We have sent the following medications to your pharmacy for you to pick up at your convenience: Nystatin  Cream apply twice daily     Start Metamucil (Psyllium Husk) Fiber Powder: Metamucil powders: Put 1-2 rounded spoons in an empty glass. If you're taking Metamucil Sugar-Free Powder or Premium Blend, use a teaspoon. If you're taking Metamucil with Real Sugar, use a tablespoon. Mix briskly with 8 oz. or more of cool liquid. Drink promptly, and enjoy! Drink 64 ounces of water  or other liquids daily.  Please follow up sooner if symptoms increase or worsen  Due to recent changes in healthcare laws, you may see the results of your imaging and laboratory studies on MyChart before your provider has had a chance to review them.  We understand that in some cases there may be results that are confusing or concerning to you. Not all laboratory results come back in the same time frame and the provider may be waiting for multiple results in order to interpret others.  Please give us  48 hours in order for your provider to thoroughly review all the results before contacting the office for clarification of your results.   Thank you for trusting me with your gastrointestinal care!   Ellouise Console, PA-C _______________________________________________________  If your blood pressure at your visit was 140/90 or greater, please contact your primary care physician to follow up on this.  _______________________________________________________  If you are age 85 or older, your body mass index should be between 23-30. Your Body mass index is 26.63 kg/m. If this is out of the aforementioned range listed, please consider follow up with your Primary Care Provider.  If you are age 22 or younger, your body mass index should be between 19-25. Your Body mass index is 26.63 kg/m. If this is out of the aformentioned range listed, please consider follow up with your Primary Care Provider.    ________________________________________________________  The [facility] providers would like to encourage you to use MYCHART to communicate with providers for non-urgent requests or questions.  Due to long hold times on the telephone, sending your provider a message by [facility] may be a faster and more efficient way to get a response.  Please allow 48 business hours for a response.  Please remember that this is for non-urgent requests.  _______________________________________________________

## 2024-08-08 NOTE — Telephone Encounter (Signed)
 Pt reports he has had some anal leakage along with rectal itching, he is concerned. Pt scheduled to see Ellouise Console PA today at 2:10pm. He is aware of the appt.

## 2024-08-11 NOTE — Progress Notes (Signed)
 Agree with assessment and plan as outlined.

## 2024-08-23 LAB — BASIC METABOLIC PANEL WITH GFR
BUN: 11 (ref 4–21)
CO2: 28 — AB (ref 13–22)
Chloride: 102 (ref 99–108)
Creatinine: 1 (ref 0.6–1.3)
Glucose: 113
Potassium: 4 meq/L (ref 3.5–5.1)
Sodium: 141 (ref 137–147)

## 2024-08-23 LAB — CBC AND DIFFERENTIAL
HCT: 42 (ref 41–53)
Hemoglobin: 14.6 (ref 13.5–17.5)
Platelets: 235 K/uL (ref 150–400)
WBC: 5.5

## 2024-08-23 LAB — HEPATIC FUNCTION PANEL
ALT: 21 U/L (ref 10–40)
AST: 28 (ref 14–40)
Alkaline Phosphatase: 94 (ref 25–125)
Bilirubin, Total: 0.4

## 2024-08-23 LAB — LIPID PANEL
Cholesterol: 154 (ref 0–200)
HDL: 49 (ref 35–70)
LDL Cholesterol: 87
Triglycerides: 140 (ref 40–160)

## 2024-08-23 LAB — LAB REPORT - SCANNED
A1c: 6.2
EGFR: 70
TSH: 3.06

## 2024-08-23 LAB — COMPREHENSIVE METABOLIC PANEL WITH GFR
Albumin: 4 (ref 3.5–5.0)
Calcium: 9.1 (ref 8.7–10.7)
Globulin: 2.6
eGFR: 70

## 2024-08-23 LAB — CBC: RBC: 4.55 (ref 3.87–5.11)

## 2024-08-23 LAB — HEMOGLOBIN A1C: Hemoglobin A1C: 6.2

## 2024-08-23 LAB — TSH: TSH: 3.06 (ref 0.41–5.90)

## 2024-08-29 ENCOUNTER — Encounter: Payer: Self-pay | Admitting: Adult Health

## 2024-08-29 ENCOUNTER — Non-Acute Institutional Stay: Payer: Self-pay | Admitting: Adult Health

## 2024-08-29 VITALS — BP 136/70 | HR 75 | Temp 97.6°F | Ht 67.0 in | Wt 170.6 lb

## 2024-08-29 DIAGNOSIS — R7303 Prediabetes: Secondary | ICD-10-CM

## 2024-08-29 NOTE — Progress Notes (Signed)
 Location:  [facility]  POS: Clinic  Provider: Tawni America, ANP   Goals of Care:     05/19/2024    8:19 AM  Advanced Directives  Does Patient Have a Medical Advance Directive? Yes  Type of Estate Agent of [place];Living will  Copy of Healthcare Power of Attorney in Chart? No - copy requested     Chief Complaint  Patient presents with   Follow-up    6 month follow up    HPI:  History of Present Illness Ian Rowe is an 83 year old male with prediabetes and gastroesophageal reflux disease who presents for follow-up and management of his conditions.  Prediabetes - Recent laboratory evaluation showed hemoglobin A1c of 6.2% and fasting glucose of 113 mg/dL. - Dietary modifications include limiting sugar intake and choosing sugar-free desserts. - Glycemic parameters have remained in the prediabetic range for many years.  Gastroesophageal reflux disease - Recent endoscopy did not show Barrett's esophagus; follow-up endoscopy planned in March. - Current regimen includes omeprazole  twice daily. - Reduction to once daily omeprazole  resulted in increased indigestion requiring additional antacids.  Fecal incontinence - Afternoon fecal leakage persists despite several-week trial of Metamucil. - Bowel pattern includes two regular morning bowel movements and sometimes a third in the afternoon, all with normal consistency. - Diet is high in fiber, including daily salads, nuts, fruits, and vegetables.  Caregiver stress - Expresses concern regarding wife's recent cognitive decline, including increased forgetfulness and confusion over the past two weeks. - Wife has mild motor symptoms of Parkinson's disease and continues to drive locally. - Concerned about wife's ability to manage during a planned cruise to [place] in two months.  PMH significant for Barrett's esophagus, RBBB, aortic atherosclerosis, GERD, gastric polyps, right shoulder bursitis, BPH, DDD,  OA, Vasovagal syncope, vid d def, gout, iron def, glaucoma, anemia, hyperglycemia.   Lives in [place] his wife that has Parkinson's disease.   Hx of Barretts esophagus and polyps, follows with GI Endoscopy 05/19/2024: hiatal hernia, multiple polyps, no recurrence of Barretts. Bx shoed gastroesophagitis, high grade dysplasia   Endoscopy q 6 months x 2 years. No further colonoscopy needed  Prediabetes A1C 6.2 12/9 TSH 3.06 12/9  LDL 87 TC 154 12/9  BP 136/70  Past Medical History:  Diagnosis Date   Allergy    seasonal   Arthritis    knees   Barrett's esophagus    Cataract    BILATERAL REMOVED   Gastric polyp    GERD (gastroesophageal reflux disease)    Glaucoma    Hemorrhoids    Hyperlipidemia    Hypertension    Pneumonia    as teenager   RBBB    Vasovagal syncope    last episode was 2015-16; none since   Wears glasses     Past Surgical History:  Procedure Laterality Date   BIOPSY  01/16/2020   Procedure: BIOPSY;  Surgeon: Wilhelmenia Aloha Raddle., MD;  Location: [facility] ENDOSCOPY;  Service: Gastroenterology;;   CARDIAC CATHETERIZATION  1993   CATARACT EXTRACTION W/ INTRAOCULAR LENS  IMPLANT, BILATERAL  2021   COLONOSCOPY  2002; 2007   ENDOSCOPIC MUCOSAL RESECTION N/A 01/16/2020   Procedure: ENDOSCOPIC MUCOSAL RESECTION;  Surgeon: Wilhelmenia Aloha Raddle., MD;  Location: [facility] ENDOSCOPY;  Service: Gastroenterology;  Laterality: N/A;   ENDOSCOPIC MUCOSAL RESECTION N/A 02/03/2022   Procedure: ENDOSCOPIC MUCOSAL RESECTION;  Surgeon: Wilhelmenia Aloha Raddle., MD;  Location: [facility] ENDOSCOPY;  Service: Gastroenterology;  Laterality: N/A;   ENDOSCOPIC MUCOSAL RESECTION  03/05/2023   Procedure: ENDOSCOPIC MUCOSAL RESECTION;  Surgeon: Wilhelmenia Aloha Raddle., MD;  Location: [facility] ENDOSCOPY;  Service: Gastroenterology;;   ESOPHAGOGASTRODUODENOSCOPY N/A 05/19/2024   Procedure: EGD (ESOPHAGOGASTRODUODENOSCOPY);  Surgeon: Wilhelmenia Aloha Raddle., MD;  Location: [facility] ENDOSCOPY;  Service: Gastroenterology;   Laterality: N/A;  RFA   ESOPHAGOGASTRODUODENOSCOPY (EGD) WITH PROPOFOL  N/A 01/16/2020   Procedure: ESOPHAGOGASTRODUODENOSCOPY (EGD) WITH PROPOFOL ;  Surgeon: Wilhelmenia Aloha Raddle., MD;  Location: [facility] ENDOSCOPY;  Service: Gastroenterology;  Laterality: N/A;   ESOPHAGOGASTRODUODENOSCOPY (EGD) WITH PROPOFOL  N/A 02/03/2022   Procedure: ESOPHAGOGASTRODUODENOSCOPY (EGD) WITH PROPOFOL ;  Surgeon: Wilhelmenia Aloha Raddle., MD;  Location: [facility] ENDOSCOPY;  Service: Gastroenterology;  Laterality: N/A;   ESOPHAGOGASTRODUODENOSCOPY (EGD) WITH PROPOFOL  N/A 03/05/2023   Procedure: ESOPHAGOGASTRODUODENOSCOPY (EGD) WITH PROPOFOL ;  Surgeon: Wilhelmenia Aloha Raddle., MD;  Location: [facility] ENDOSCOPY;  Service: Gastroenterology;  Laterality: N/A;   GI RADIOFREQUENCY ABLATION  02/03/2022   Procedure: GI RADIOFREQUENCY ABLATION;  Surgeon: Wilhelmenia Aloha Raddle., MD;  Location: [facility] ENDOSCOPY;  Service: Gastroenterology;;   HEMOSTASIS CLIP PLACEMENT  01/16/2020   Procedure: HEMOSTASIS CLIP PLACEMENT;  Surgeon: Wilhelmenia Aloha Raddle., MD;  Location: [facility] ENDOSCOPY;  Service: Gastroenterology;;   HEMOSTASIS CLIP PLACEMENT  02/03/2022   Procedure: HEMOSTASIS CLIP PLACEMENT;  Surgeon: Wilhelmenia Aloha Raddle., MD;  Location: [facility]OSCOPY;  Service: Gastroenterology;;   HEMOSTASIS CLIP PLACEMENT  03/05/2023   Procedure: HEMOSTASIS CLIP PLACEMENT;  Surgeon: Wilhelmenia Aloha Raddle., MD;  Location: [facility] ENDOSCOPY;  Service: Gastroenterology;;   HEMOSTASIS CONTROL  01/16/2020   Procedure: HEMOSTASIS CONTROL;  Surgeon: Wilhelmenia Aloha Raddle., MD;  Location: [facility] ENDOSCOPY;  Service: Gastroenterology;;   POLYPECTOMY     POLYPECTOMY  02/03/2022   Procedure: POLYPECTOMY;  Surgeon: Wilhelmenia Aloha Raddle., MD;  Location: [facility] ENDOSCOPY;  Service: Gastroenterology;;   RADIOFREQUENCY ABLATION  03/05/2023   Procedure: RADIO FREQUENCY ABLATION;  Surgeon: Wilhelmenia Aloha Raddle., MD;  Location: [facility]OS[facility];  Service: Gastroenterology;;   WALDEMAR  02/03/2022    Procedure: WALDEMAR;  Surgeon: Wilhelmenia Aloha Raddle., MD;  Location: [facility] ENDOSCOPY;  Service: Gastroenterology;;  injection of epinephrine  into gastric polyp   SUBMUCOSAL LIFTING INJECTION  01/16/2020   Procedure: SUBMUCOSAL LIFTING INJECTION;  Surgeon: Wilhelmenia Aloha Raddle., MD;  Location: [facility] ENDOSCOPY;  Service: Gastroenterology;;   SUBMUCOSAL LIFTING INJECTION  03/05/2023   Procedure: SUBMUCOSAL LIFTING INJECTION;  Surgeon: Wilhelmenia Aloha Raddle., MD;  Location: [facility] ENDOSCOPY;  Service: Gastroenterology;;   WISDOM TOOTH EXTRACTION      Allergies[1]  Outpatient Encounter Medications as of 08/29/2024  Medication Sig   amLODipine  (NORVASC ) 2.5 MG tablet TAKE 1 TABLET BY MOUTH DAILY   b complex vitamins tablet Take 1 tablet by mouth daily.   cetirizine (ZYRTEC) 10 MG tablet Take 10 mg by mouth daily as needed for allergies.    Cholecalciferol (VITAMIN D ) 2000 UNITS CAPS Take 1 capsule (2,000 Units total) by mouth daily.   fluticasone (FLONASE) 50 MCG/ACT nasal spray Place 2 sprays into both nostrils daily as needed for allergies or rhinitis.   folic acid (FOLVITE) 800 MCG tablet Take 800 mcg by mouth daily.    Glucosamine-Chondroit-Vit C-Mn (GLUCOSAMINE CHONDR 1500 COMPLX PO) Take 2 tablets by mouth daily.    latanoprost (XALATAN) 0.005 % ophthalmic solution Place 1 drop into both eyes at bedtime.   nystatin  cream (MYCOSTATIN ) Apply 1 Application topically 2 (two) times daily.   omeprazole  (PRILOSEC) 40 MG capsule Take 1 capsule (40 mg total) by mouth 2 (two) times daily before a meal. Twice daily for 2 months then may decrease to once daily.  rosuvastatin  (CRESTOR ) 5 MG tablet TAKE 1 TABLET BY MOUTH DAILY   timolol (TIMOPTIC) 0.5 % ophthalmic solution Place 1 drop into both eyes in the morning.   vitamin B-12 (CYANOCOBALAMIN) 1000 MCG tablet Take 1,000 mcg by mouth daily.   No facility-administered encounter medications on file as of 08/29/2024.    Review of Systems:  Review  of Systems  Constitutional:  Negative for activity change, appetite change, chills, diaphoresis, fatigue and fever.  HENT:  Negative for congestion.   Respiratory:  Negative for cough, shortness of breath and wheezing.   Cardiovascular:  Negative for chest pain and leg swelling.  Gastrointestinal:  Negative for abdominal distention, abdominal pain, constipation, diarrhea, nausea and vomiting.       Gerd, belching, fecal leakage  Genitourinary:  Negative for difficulty urinating, dysuria and urgency.  Musculoskeletal:  Negative for back pain, gait problem, myalgias and neck pain.  Skin:  Negative for rash.  Neurological:  Negative for dizziness and weakness.  Psychiatric/Behavioral:  Negative for confusion.     Health Maintenance  Topic Date Due   COVID-19 Vaccine (9 - 2025-26 season) 05/16/2024   Medicare Annual Wellness (AWV)  07/19/2024   DTaP/Tdap/Td (3 - Td or Tdap) 11/02/2030   Pneumococcal Vaccine: 50+ Years  Completed   Influenza Vaccine  Completed   Zoster Vaccines- Shingrix  Completed   Meningococcal B Vaccine  Aged Out   Colonoscopy  Discontinued    Physical Exam: Vitals:   08/29/24 1304  BP: 136/70  Pulse: 75  Temp: 97.6 F (36.4 C)  SpO2: 96%  Weight: 170 lb 9.6 oz (77.4 kg)  Height: 5' 7 (1.702 m)   Body mass index is 26.72 kg/m. Physical Exam Vitals and nursing note reviewed.  Constitutional:      Appearance: Normal appearance.  HENT:     Head: Normocephalic and atraumatic.     Right Ear: Tympanic membrane normal.     Left Ear: Tympanic membrane normal.     Nose: Nose normal.     Mouth/Throat:     Mouth: Mucous membranes are moist.     Pharynx: Oropharynx is clear.  Eyes:     Conjunctiva/sclera: Conjunctivae normal.     Pupils: Pupils are equal, round, and reactive to light.  Cardiovascular:     Rate and Rhythm: Normal rate and regular rhythm.     Heart sounds: No murmur heard. Pulmonary:     Effort: Pulmonary effort is normal. No respiratory  distress.     Breath sounds: Normal breath sounds. No wheezing.  Abdominal:     General: Bowel sounds are normal. There is no distension.     Palpations: Abdomen is soft.     Tenderness: There is no abdominal tenderness.  Musculoskeletal:     Cervical back: No rigidity.     Right lower leg: No edema.     Left lower leg: No edema.  Lymphadenopathy:     Cervical: No cervical adenopathy.  Skin:    General: Skin is warm and dry.  Neurological:     General: No focal deficit present.     Mental Status: He is alert and oriented to person, place, and time. Mental status is at baseline.  Psychiatric:        Mood and Affect: Mood normal.     Labs reviewed: Basic Metabolic Panel: Recent Labs    02/23/24 0000 08/23/24 0000 08/23/24 0234  NA 142  142 141  --   K 4.2  4.2 4.0  --  CL 102  102 102  --   CO2 26*  26* 28*  --   BUN 13  13 11   --   CREATININE 1.1  1.1 1.0  --   CALCIUM  9.3  9.3 9.1  --   TSH  --  3.06 3.06   Liver Function Tests: Recent Labs    08/23/24 0000  AST 28  ALT 21  ALKPHOS 94  ALBUMIN 4.0   No results for input(s): LIPASE, AMYLASE in the last 8760 hours. No results for input(s): AMMONIA in the last 8760 hours. CBC: Recent Labs    02/23/24 0000 08/23/24 0000  WBC 6.8  6.8 5.5  HGB 15.1  15.1 14.6  HCT 45  45 42  PLT 251  251 235   Lipid Panel: Recent Labs    08/23/24 0000  CHOL 154  HDL 49  LDLCALC 87  TRIG 140   Lab Results  Component Value Date   HGBA1C 6.2 08/23/2024    Procedures since last visit: No results found.  Assessment/Plan Assessment and Plan Assessment & Plan Fecal incontinence Intermittent fecal leakage with increased bowel movements. High fiber diet maintained. No significant concern as long as near a bathroom. -decided to discontinue metamucil as stool is formed and med is not helping.   Gastroesophageal reflux disease GERD managed with omeprazole  twice daily. Prefers current regimen due to  indigestion if reduced.  - Continue omeprazole  twice daily.  Prediabetes A1c is 6.2, fasting glucose 113. Reduced carb and sugar diet.  - Continue current dietary management.  Hyperlipidemia LDL 87, total cholesterol 845, indicating good control. On rosuvastatin . - Continue rosuvastatin  as prescribed.  Hypertension Blood pressure well-controlled on current medication regimen. - Continue amlodipine  as prescribed.  General Health Maintenance Up to date on vaccinations, including flu and COVID in October. - Continue routine health maintenance and vaccinations as scheduled.      Labs/tests ordered:  * No order type specified *A1c and BMP in 6 months Next appt:  6 months    Total time :  time greater than 50% of total time spent doing pt counseling and coordination of care         [1]  Allergies Allergen Reactions   Black Pepper [[place]] Other (See Comments)    bloating   Ciprofloxacin Other (See Comments)    Bleeding-blood in stool    Nsaids     History of bleeding

## 2024-09-05 ENCOUNTER — Encounter: Admitting: Adult Health

## 2024-09-19 ENCOUNTER — Encounter: Payer: Self-pay | Admitting: Adult Health

## 2024-09-19 ENCOUNTER — Non-Acute Institutional Stay: Admitting: Adult Health

## 2024-09-19 VITALS — BP 126/72 | HR 82 | Temp 97.9°F | Ht 67.0 in | Wt 173.4 lb

## 2024-09-19 DIAGNOSIS — Z0000 Encounter for general adult medical examination without abnormal findings: Secondary | ICD-10-CM

## 2024-09-19 NOTE — Patient Instructions (Signed)
 [name],  Thank you for taking the time for your Medicare Wellness Visit. I appreciate your continued commitment to your health goals. Please review the care plan we discussed, and feel free to reach out if I can assist you further.  Please note that Annual Wellness Visits do not include a physical exam. Some assessments may be limited, especially if the visit was conducted virtually. If needed, we may recommend an in-person follow-up with your provider.  Ongoing Care Seeing your primary care provider every 3 to 6 months helps us  monitor your health and provide consistent, personalized care.   Referrals If a referral was made during today's visit and you haven't received any updates within two weeks, please contact the referred provider directly to check on the status.  Recommended Screenings:  Health Maintenance  Topic Date Due   Medicare Annual Wellness Visit  07/19/2024   COVID-19 Vaccine (10 - Moderna risk 2025-26 season) 12/12/2024   DTaP/Tdap/Td vaccine (3 - Td or Tdap) 11/02/2030   Pneumococcal Vaccine for age over 69  Completed   Flu Shot  Completed   Zoster (Shingles) Vaccine  Completed   Meningitis B Vaccine  Aged Out   Colon Cancer Screening  Discontinued       09/19/2024    8:44 AM  Advanced Directives  Does Patient Have a Medical Advance Directive? Yes  Type of Advance Directive Healthcare Power of Attorney  Does patient want to make changes to medical advance directive? No - Patient declined  Copy of Healthcare Power of Attorney in Chart? Yes - validated most recent copy scanned in chart (See row information)    Vision: Annual vision screenings are recommended for early detection of glaucoma, cataracts, and diabetic retinopathy. These exams can also reveal signs of chronic conditions such as diabetes and high blood pressure.  Dental: Annual dental screenings help detect early signs of oral cancer, gum disease, and other conditions linked to overall health,  including heart disease and diabetes. [name] , Thank you for taking time to come for your Medicare Wellness Visit. I appreciate your ongoing commitment to your health goals. Please review the following plan we discussed and let me know if I can assist you in the future.   Screening recommendations/referrals: Colonoscopy aged out, continues to received endoscopy at regular intervals. Recommended yearly ophthalmology/optometry visit for glaucoma screening and checkup Recommended yearly dental visit for hygiene and checkup  Vaccinations: Influenza vaccine due annually in September/October Pneumococcal vaccine up to date  Tdap vaccine up to date  Shingles vaccine up to date     Advanced directives: reviewed   Conditions/risks identified: NA  Next appointment: 1 year   Preventive Care 36 Years and Older, Male Preventive care refers to lifestyle choices and visits with your health care provider that can promote health and wellness. What does preventive care include? A yearly physical exam. This is also called an annual well check. Dental exams once or twice a year. Routine eye exams. Ask your health care provider how often you should have your eyes checked. Personal lifestyle choices, including: Daily care of your teeth and gums. Regular physical activity. Eating a healthy diet. Avoiding tobacco and drug use. Limiting alcohol use. Practicing safe sex. Taking low doses of aspirin every day. Taking vitamin and mineral supplements as recommended by your health care provider. What happens during an annual well check? The services and screenings done by your health care provider during your annual well check will depend on your age, overall health,  lifestyle risk factors, and family history of disease. Counseling  Your health care provider may ask you questions about your: Alcohol use. Tobacco use. Drug use. Emotional well-being. Home and relationship well-being. Sexual  activity. Eating habits. History of falls. Memory and ability to understand (cognition). Work and work astronomer. Screening  You may have the following tests or measurements: Height, weight, and BMI. Blood pressure. Lipid and cholesterol levels. These may be checked every 5 years, or more frequently if you are over 39 years old. Skin check. Lung cancer screening. You may have this screening every year starting at age 28 if you have a 30-pack-year history of smoking and currently smoke or have quit within the past 15 years. Fecal occult blood test (FOBT) of the stool. You may have this test every year starting at age 8. Flexible sigmoidoscopy or colonoscopy. You may have a sigmoidoscopy every 5 years or a colonoscopy every 10 years starting at age 63. Prostate cancer screening. Recommendations will vary depending on your family history and other risks. Hepatitis C blood test. Hepatitis B blood test. Sexually transmitted disease (STD) testing. Diabetes screening. This is done by checking your blood sugar (glucose) after you have not eaten for a while (fasting). You may have this done every 1-3 years. Abdominal aortic aneurysm (AAA) screening. You may need this if you are a current or former smoker. Osteoporosis. You may be screened starting at age 31 if you are at high risk. Talk with your health care provider about your test results, treatment options, and if necessary, the need for more tests. Vaccines  Your health care provider may recommend certain vaccines, such as: Influenza vaccine. This is recommended every year. Tetanus, diphtheria, and acellular pertussis (Tdap, Td) vaccine. You may need a Td booster every 10 years. Zoster vaccine. You may need this after age 50. Pneumococcal 13-valent conjugate (PCV13) vaccine. One dose is recommended after age 49. Pneumococcal polysaccharide (PPSV23) vaccine. One dose is recommended after age 80. Talk to your health care provider about which  screenings and vaccines you need and how often you need them. This information is not intended to replace advice given to you by your health care provider. Make sure you discuss any questions you have with your health care provider. Document Released: 09/28/2015 Document Revised: 05/21/2016 Document Reviewed: 07/03/2015 Elsevier Interactive Patient Education  2017 [employer].  Fall Prevention in the Home Falls can cause injuries. They can happen to people of all ages. There are many things you can do to make your home safe and to help prevent falls. What can I do on the outside of my home? Regularly fix the edges of walkways and driveways and fix any cracks. Remove anything that might make you trip as you walk through a door, such as a raised step or threshold. Trim any bushes or trees on the path to your home. Use bright outdoor lighting. Clear any walking paths of anything that might make someone trip, such as rocks or tools. Regularly check to see if handrails are loose or broken. Make sure that both sides of any steps have handrails. Any raised decks and porches should have guardrails on the edges. Have any leaves, snow, or ice cleared regularly. Use sand or salt on walking paths during winter. Clean up any spills in your garage right away. This includes oil or grease spills. What can I do in the bathroom? Use night lights. Install grab bars by the toilet and in the tub and shower. Do not use  towel bars as grab bars. Use non-skid mats or decals in the tub or shower. If you need to sit down in the shower, use a plastic, non-slip stool. Keep the floor dry. Clean up any water  that spills on the floor as soon as it happens. Remove soap buildup in the tub or shower regularly. Attach bath mats securely with double-sided non-slip rug tape. Do not have throw rugs and other things on the floor that can make you trip. What can I do in the bedroom? Use night lights. Make sure that you have a  light by your bed that is easy to reach. Do not use any sheets or blankets that are too big for your bed. They should not hang down onto the floor. Have a firm chair that has side arms. You can use this for support while you get dressed. Do not have throw rugs and other things on the floor that can make you trip. What can I do in the kitchen? Clean up any spills right away. Avoid walking on wet floors. Keep items that you use a lot in easy-to-reach places. If you need to reach something above you, use a strong step stool that has a grab bar. Keep electrical cords out of the way. Do not use floor polish or wax that makes floors slippery. If you must use wax, use non-skid floor wax. Do not have throw rugs and other things on the floor that can make you trip. What can I do with my stairs? Do not leave any items on the stairs. Make sure that there are handrails on both sides of the stairs and use them. Fix handrails that are broken or loose. Make sure that handrails are as long as the stairways. Check any carpeting to make sure that it is firmly attached to the stairs. Fix any carpet that is loose or worn. Avoid having throw rugs at the top or bottom of the stairs. If you do have throw rugs, attach them to the floor with carpet tape. Make sure that you have a light switch at the top of the stairs and the bottom of the stairs. If you do not have them, ask someone to add them for you. What else can I do to help prevent falls? Wear shoes that: Do not have high heels. Have rubber bottoms. Are comfortable and fit you well. Are closed at the toe. Do not wear sandals. If you use a stepladder: Make sure that it is fully opened. Do not climb a closed stepladder. Make sure that both sides of the stepladder are locked into place. Ask someone to hold it for you, if possible. Clearly mark and make sure that you can see: Any grab bars or handrails. First and last steps. Where the edge of each step  is. Use tools that help you move around (mobility aids) if they are needed. These include: Canes. Walkers. Scooters. Crutches. Turn on the lights when you go into a dark area. Replace any light bulbs as soon as they burn out. Set up your furniture so you have a clear path. Avoid moving your furniture around. If any of your floors are uneven, fix them. If there are any pets around you, be aware of where they are. Review your medicines with your doctor. Some medicines can make you feel dizzy. This can increase your chance of falling. Ask your doctor what other things that you can do to help prevent falls. This information is not intended to replace advice given  to you by your health care provider. Make sure you discuss any questions you have with your health care provider. Document Released: 06/28/2009 Document Revised: 02/07/2016 Document Reviewed: 10/06/2014 Elsevier Interactive Patient Education  2017 [employer].

## 2024-09-19 NOTE — Progress Notes (Signed)
 "  Chief Complaint  Patient presents with   Medicare Wellness     Subjective:   Ian Rowe is a 84 y.o. male who presents for a Medicare Annual Wellness Visit at [facility] setting.   Visit info / Clinical Intake: Medicare Wellness Visit Type:: Initial Annual Wellness Visit Persons participating in visit and providing information:: patient Medicare Wellness Visit Mode:: In-person (required for WTM) Interpreter Needed?: No Pre-visit prep was completed: no AWV questionnaire completed by patient prior to visit?: no Living arrangements:: lives with spouse/significant other Patient's Overall Health Status Rating: very good Typical amount of pain: some Does pain affect daily life?: no Are you currently prescribed opioids?: no  Dietary Habits and Nutritional Risks How many meals a day?: 3 Eats fruit and vegetables daily?: yes Most meals are obtained by: preparing own meals In the last 2 weeks, have you had any of the following?: none Diabetic:: no  Functional Status Activities of Daily Living (to include ambulation/medication): Independent Ambulation: Independent Medication Administration: Independent Home Management (perform basic housework or laundry): Independent Manage your own finances?: yes Primary transportation is: driving Concerns about vision?: no *vision screening is required for WTM* Concerns about hearing?: (!) yes  Fall Screening Falls in the past year?: 0 Number of falls in past year: 0 Was there an injury with Fall?: 0 Fall Risk Category Calculator: 0 Patient Fall Risk Level: Low Fall Risk  Fall Risk Patient at Risk for Falls Due to: No Fall Risks Fall risk Follow up: Falls evaluation completed  Home and Transportation Safety: All rugs have non-skid backing?: N/A, no rugs All stairs or steps have railings?: (!) no Grab bars in the bathtub or shower?: yes Have non-skid surface in bathtub or shower?: yes Good home  lighting?: yes Regular seat belt use?: yes Hospital stays in the last year:: no  Cognitive Assessment Difficulty concentrating, remembering, or making decisions? : no Will 6CIT or Mini Cog be Completed: yes What year is it?: 0 points What month is it?: 0 points Give patient an address phrase to remember (5 components): [address] [place], [place] About what time is it?: 0 points Count backwards from 20 to 1: 0 points Say the months of the year in reverse: 0 points Repeat the address phrase from earlier: 0 points 6 CIT Score: 0 points  Advance Directives (For Healthcare) Does Patient Have a Medical Advance Directive?: Yes Does patient want to make changes to medical advance directive?: No - Patient declined Type of Advance Directive: Healthcare Power of Attorney Copy of Healthcare Power of Attorney in Chart?: Yes - validated most recent copy scanned in chart (See row information) Copy of Living Will in Chart?: Yes - validated most recent copy scanned in chart (See row information)  Reviewed/Updated  Reviewed/Updated: Patient Goals; Reviewed All (Medical, Surgical, Family, Medications, Allergies, Care Teams, Patient Goals)  Full code   Allergies (verified) Black pepper [piper], Ciprofloxacin, and Nsaids   Current Medications (verified) Outpatient Encounter Medications as of 09/19/2024  Medication Sig   amLODipine  (NORVASC ) 2.5 MG tablet TAKE 1 TABLET BY MOUTH DAILY   b complex vitamins tablet Take 1 tablet by mouth daily.   cetirizine (ZYRTEC) 10 MG tablet Take 10 mg by mouth daily as needed for allergies.    Cholecalciferol (VITAMIN D ) 2000 UNITS CAPS Take 1 capsule (2,000 Units total) by mouth daily.   fluticasone (FLONASE) 50 MCG/ACT nasal spray Place 2 sprays into both nostrils daily as needed for allergies or rhinitis.  folic acid (FOLVITE) 800 MCG tablet Take 800 mcg by mouth daily.    Glucosamine-Chondroit-Vit C-Mn (GLUCOSAMINE CHONDR 1500 COMPLX PO) Take 1 tablet by  mouth daily.   latanoprost (XALATAN) 0.005 % ophthalmic solution Place 1 drop into both eyes at bedtime.   nystatin  cream (MYCOSTATIN ) Apply 1 Application topically 2 (two) times daily.   omeprazole  (PRILOSEC) 40 MG capsule Take 1 capsule (40 mg total) by mouth 2 (two) times daily before a meal. Twice daily for 2 months then may decrease to once daily.   rosuvastatin  (CRESTOR ) 5 MG tablet TAKE 1 TABLET BY MOUTH DAILY   timolol (TIMOPTIC) 0.5 % ophthalmic solution Place 1 drop into both eyes in the morning.   vitamin B-12 (CYANOCOBALAMIN) 1000 MCG tablet Take 1,000 mcg by mouth daily.   No facility-administered encounter medications on file as of 09/19/2024.    History: Past Medical History:  Diagnosis Date   Allergy    seasonal   Arthritis    knees   Barrett's esophagus    Cataract    BILATERAL REMOVED   Gastric polyp    GERD (gastroesophageal reflux disease)    Glaucoma    Hemorrhoids    Hyperlipidemia    Hypertension    Pneumonia    as teenager   RBBB    Vasovagal syncope    last episode was 2015-16; none since   Wears glasses    Past Surgical History:  Procedure Laterality Date   BIOPSY  01/16/2020   Procedure: BIOPSY;  Surgeon: Wilhelmenia Aloha Raddle., MD;  Location: [facility] ENDOSCOPY;  Service: Gastroenterology;;   CARDIAC CATHETERIZATION  1993   CATARACT EXTRACTION W/ INTRAOCULAR LENS  IMPLANT, BILATERAL  2021   COLONOSCOPY  2002; 2007   ENDOSCOPIC MUCOSAL RESECTION N/A 01/16/2020   Procedure: ENDOSCOPIC MUCOSAL RESECTION;  Surgeon: Wilhelmenia Aloha Raddle., MD;  Location: [facility] ENDOSCOPY;  Service: Gastroenterology;  Laterality: N/A;   ENDOSCOPIC MUCOSAL RESECTION N/A 02/03/2022   Procedure: ENDOSCOPIC MUCOSAL RESECTION;  Surgeon: Wilhelmenia Aloha Raddle., MD;  Location: [facility] ENDOSCOPY;  Service: Gastroenterology;  Laterality: N/A;   ENDOSCOPIC MUCOSAL RESECTION  03/05/2023   Procedure: ENDOSCOPIC MUCOSAL RESECTION;  Surgeon: Wilhelmenia Aloha Raddle., MD;  Location: [facility] ENDOSCOPY;   Service: Gastroenterology;;   ESOPHAGOGASTRODUODENOSCOPY N/A 05/19/2024   Procedure: EGD (ESOPHAGOGASTRODUODENOSCOPY);  Surgeon: Wilhelmenia Aloha Raddle., MD;  Location: [facility] ENDOSCOPY;  Service: Gastroenterology;  Laterality: N/A;  RFA   ESOPHAGOGASTRODUODENOSCOPY (EGD) WITH PROPOFOL  N/A 01/16/2020   Procedure: ESOPHAGOGASTRODUODENOSCOPY (EGD) WITH PROPOFOL ;  Surgeon: Wilhelmenia Aloha Raddle., MD;  Location[facility] END[facility]COPY;  Service: Gastroenterology;  Laterality: N/A;   ESOPHAGOGASTRODUODENOSCOPY (EGD) WITH PROPOFOL  N/A 02/03/2022   Procedure: ESOPHAGOGASTRODUODENOSCOPY (EGD) WITH PROPOFOL ;  Surgeon: Wilhelmenia Aloha Raddle., MD;  Location: [facility] ENDOSCOPY;  Service: Gastroenterology;  Laterality: N/A;   ESOPHAGOGASTRODUODENOSCOPY (EGD) WITH PROPOFOL  N/A 03/05/2023   Procedure: ESOPHAGOGASTRODUODENOSCOPY (EGD) WITH PROPOFOL ;  Surgeon: Wilhelmenia Aloha Raddle., MD;  Location: [facility] ENDOSCOPY;  Service: Gastroenterology;  Laterality: N/A;   GI RADIOFREQUENCY ABLATION  02/03/2022   Procedure: GI RADIOFREQUENCY ABLATION;  Surgeon: Wilhelmenia Aloha Raddle., MD;  Location: [facility] ENDOSCOPY;  Service: Gastroenterology;;   HEMOSTASIS CLIP PLACEMENT  01/16/2020   Procedure: HEMOSTASIS CLIP PLACEMENT;  Surgeon: Wilhelmenia Aloha Raddle., MD;  Location: [facility] ENDOSCOPY;  Service: Gastroenterology;;   HEMOSTASIS CLIP PLACEMENT  02/03/2022   Procedure: HEMOSTASIS CLIP PLACEMENT;  Surgeon: Wilhelmenia Aloha Raddle., MD;  Location: [facility] ENDOSCOPY;  Service: Gastroenterology;;   HEMOSTASIS CLIP PLACEMENT  03/05/2023   Procedure: HEMOSTASIS CLIP PLACEMENT;  Surgeon: Wilhelmenia Aloha Raddle., MD;  Location: [facility] ENDOSCOPY;  Service: Gastroenterology;;   HEMOSTASIS CONTROL  01/16/2020   Procedure: HEMOSTASIS CONTROL;  Surgeon: Wilhelmenia Aloha Raddle., MD;  Location: [facility] ENDOSCOPY;  Service: Gastroenterology;;   POLYPECTOMY     POLYPECTOMY  02/03/2022   Procedure: POLYPECTOMY;  Surgeon: Wilhelmenia Aloha Raddle., MD;  Location: [facility] ENDOSCOPY;  Service:  Gastroenterology;;   RADIOFREQUENCY ABLATION  03/05/2023   Procedure: RADIO FREQUENCY ABLATION;  Surgeon: Wilhelmenia Aloha Raddle., MD;  Location: [facility] ENDOSCOPY;  Service: Gastroenterology;;   WALDEMAR  02/03/2022   Procedure: WALDEMAR;  Surgeon: Mansouraty, Aloha Raddle., MD;  Location: [facility] ENDOSCOPY;  Service: Gastroenterology;;  injection of epinephrine  into gastric polyp   SUBMUCOSAL LIFTING INJECTION  01/16/2020   Procedure: SUBMUCOSAL LIFTING INJECTION;  Surgeon: Wilhelmenia Aloha Raddle., MD;  Location: [facility] ENDOSCOPY;  Service: Gastroenterology;;   SUBMUCOSAL LIFTING INJECTION  03/05/2023   Procedure: SUBMUCOSAL LIFTING INJECTION;  Surgeon: Wilhelmenia Aloha Raddle., MD;  Location: [facility] ENDOSCOPY;  Service: Gastroenterology;;   WISDOM TOOTH EXTRACTION     Family History  Problem Relation Age of Onset   Alzheimer's disease Mother    Heart disease Father    Colon cancer Neg Hx    Colon polyps Neg Hx    Esophageal cancer Neg Hx    Stomach cancer Neg Hx    Rectal cancer Neg Hx    Inflammatory bowel disease Neg Hx    Liver disease Neg Hx    Pancreatic cancer Neg Hx    Social History   Occupational History   Occupation: Retired interior and spatial designer  Tobacco Use   Smoking status: Never   Smokeless tobacco: Never  Vaping Use   Vaping status: Never Used  Substance and Sexual Activity   Alcohol use: Yes    Alcohol/week: 10.0 standard drinks of alcohol    Types: 7 Shots of liquor, 3 Glasses of wine per week    Comment: 2 scotch at bedtime nightly, occ glass of wine    Drug use: No   Sexual activity: Not on file   Tobacco Counseling Counseling given: Not Answered  SDOH Screenings   Food Insecurity: No Food Insecurity (11/23/2023)  Housing: Low Risk (11/23/2023)  Transportation Needs: No Transportation Needs (11/23/2023)  Alcohol Screen: Low Risk (11/23/2023)  Depression (PHQ2-9): Low Risk (08/29/2024)  Financial Resource Strain: Low Risk (11/23/2023)  Physical  Activity: Sufficiently Active (11/23/2023)  Social Connections: Socially Isolated (11/23/2023)  Stress: No Stress Concern Present (11/23/2023)  Tobacco Use: Low Risk (09/19/2024)   See flowsheets for full screening details  Depression Screen PHQ 2 & 9 Depression Scale- Over the past 2 weeks, how often have you been bothered by any of the following problems? Little interest or pleasure in doing things: 0 Feeling down, depressed, or hopeless (PHQ Adolescent also includes...irritable): 0 PHQ-2 Total Score: 0     Goals Addressed             This Visit's Progress    Patient Stated       Goal is to be in independent living as long as possible.              Objective:    Today's Vitals   09/19/24 0837  BP: 126/72  Pulse: 82  Temp: 97.9 F (36.6 C)  SpO2: 96%  Weight: 173 lb 6.4 oz (78.7 kg)  Height: 5' 7 (1.702 m)   Body mass index is 27.16 kg/m.  Hearing/Vision screen Hearing Screening - Comments:: No hearing issues Vision Screening - Comments:: [facility] Dr. Wanda Mae Immunizations  and Health Maintenance Health Maintenance  Topic Date Due   COVID-19 Vaccine (10 - Moderna risk 2025-26 season) 12/12/2024   Medicare Annual Wellness (AWV)  09/19/2025   DTaP/Tdap/Td (3 - Td or Tdap) 11/02/2030   Pneumococcal Vaccine: 50+ Years  Completed   Influenza Vaccine  Completed   Zoster Vaccines- Shingrix  Completed   Meningococcal B Vaccine  Aged Out   Colonoscopy  Discontinued        Assessment/Plan:  This is a routine wellness examination for Rip.  Patient Care Team: Charlanne Fredia CROME, MD as PCP - General (Internal Medicine) Community, [facility] Armbruster, Elspeth SQUIBB, MD as Consulting Physician (Gastroenterology) Mansouraty, Aloha Raddle., MD as Consulting Physician (Gastroenterology)  I have personally reviewed and noted the following in the patients chart:   Medical and social history Use of alcohol, tobacco or illicit drugs   Current medications and supplements including opioid prescriptions. Functional ability and status Nutritional status Physical activity Advanced directives List of other physicians Hospitalizations, surgeries, and ER visits in previous 12 months Vitals Screenings to include cognitive, depression, and falls Referrals and appointments  No orders of the defined types were placed in this encounter.  In addition, I have reviewed and discussed with patient certain preventive protocols, quality metrics, and best practice recommendations. A written personalized care plan for preventive services as well as general preventive health recommendations were provided to patient.   Tawni America, NP   09/19/2024   Return in 1 year (on 09/19/2025).  After Visit Summary: (In Person-Printed) AVS printed and given to the patient  Nurse Notes: NA "

## 2024-10-17 ENCOUNTER — Other Ambulatory Visit: Payer: Self-pay | Admitting: Internal Medicine

## 2024-10-17 DIAGNOSIS — E782 Mixed hyperlipidemia: Secondary | ICD-10-CM

## 2025-02-28 ENCOUNTER — Encounter: Admitting: Internal Medicine
